# Patient Record
Sex: Female | Born: 1970 | Race: Black or African American | Hispanic: No | Marital: Single | Smoking: Current every day smoker
Health system: Southern US, Community
[De-identification: ages and names within clinical notes are randomized; demographics above are authoritative.]

## PROBLEM LIST (undated history)

## (undated) DIAGNOSIS — Q210 Ventricular septal defect: Secondary | ICD-10-CM

## (undated) DIAGNOSIS — Q2542 Hypoplasia of aorta: Secondary | ICD-10-CM

## (undated) DIAGNOSIS — F419 Anxiety disorder, unspecified: Secondary | ICD-10-CM

## (undated) DIAGNOSIS — G43909 Migraine, unspecified, not intractable, without status migrainosus: Secondary | ICD-10-CM

## (undated) DIAGNOSIS — R011 Cardiac murmur, unspecified: Secondary | ICD-10-CM

## (undated) DIAGNOSIS — F32A Depression, unspecified: Secondary | ICD-10-CM

## (undated) DIAGNOSIS — I10 Essential (primary) hypertension: Secondary | ICD-10-CM

## (undated) HISTORY — PX: HAND SURGERY: SHX662

## (undated) HISTORY — PX: TUBAL LIGATION: SHX77

---

## 2004-07-17 ENCOUNTER — Emergency Department: Payer: Self-pay | Admitting: Emergency Medicine

## 2004-10-23 ENCOUNTER — Emergency Department: Payer: Self-pay | Admitting: General Practice

## 2004-11-06 ENCOUNTER — Emergency Department: Payer: Self-pay | Admitting: Emergency Medicine

## 2004-12-30 ENCOUNTER — Emergency Department: Payer: Self-pay | Admitting: Emergency Medicine

## 2004-12-31 ENCOUNTER — Emergency Department: Payer: Self-pay | Admitting: Emergency Medicine

## 2005-05-29 ENCOUNTER — Emergency Department: Payer: Self-pay | Admitting: Emergency Medicine

## 2005-06-13 ENCOUNTER — Ambulatory Visit: Payer: Self-pay | Admitting: Family Medicine

## 2005-06-17 ENCOUNTER — Inpatient Hospital Stay: Payer: Self-pay | Admitting: Psychiatry

## 2005-07-20 ENCOUNTER — Emergency Department: Payer: Self-pay | Admitting: Emergency Medicine

## 2006-02-06 ENCOUNTER — Other Ambulatory Visit: Payer: Self-pay

## 2006-02-14 ENCOUNTER — Ambulatory Visit: Payer: Self-pay | Admitting: Orthopedic Surgery

## 2006-03-25 ENCOUNTER — Emergency Department: Payer: Self-pay | Admitting: Emergency Medicine

## 2006-04-08 HISTORY — PX: GANGLION CYST EXCISION: SHX1691

## 2006-07-14 ENCOUNTER — Encounter: Payer: Self-pay | Admitting: Rheumatology

## 2006-08-07 ENCOUNTER — Encounter: Payer: Self-pay | Admitting: Rheumatology

## 2007-07-30 ENCOUNTER — Emergency Department (HOSPITAL_COMMUNITY): Admission: EM | Admit: 2007-07-30 | Discharge: 2007-07-30 | Payer: Self-pay | Admitting: Family Medicine

## 2008-05-10 ENCOUNTER — Emergency Department: Payer: Self-pay | Admitting: Emergency Medicine

## 2008-06-10 ENCOUNTER — Emergency Department: Payer: Self-pay | Admitting: Emergency Medicine

## 2009-10-09 ENCOUNTER — Observation Stay (HOSPITAL_COMMUNITY): Admission: EM | Admit: 2009-10-09 | Discharge: 2009-10-09 | Payer: Self-pay | Admitting: Emergency Medicine

## 2010-06-24 LAB — RAPID URINE DRUG SCREEN, HOSP PERFORMED
Amphetamines: NOT DETECTED
Opiates: NOT DETECTED
Tetrahydrocannabinol: POSITIVE — AB

## 2010-06-24 LAB — CBC
HCT: 31.1 % — ABNORMAL LOW (ref 36.0–46.0)
Hemoglobin: 10 g/dL — ABNORMAL LOW (ref 12.0–15.0)
MCH: 24.9 pg — ABNORMAL LOW (ref 26.0–34.0)
MCHC: 32.2 g/dL (ref 30.0–36.0)
MCV: 77.2 fL — ABNORMAL LOW (ref 78.0–100.0)
RBC: 4.03 MIL/uL (ref 3.87–5.11)
RDW: 18 % — ABNORMAL HIGH (ref 11.5–15.5)
WBC: 11.6 10*3/uL — ABNORMAL HIGH (ref 4.0–10.5)

## 2010-06-24 LAB — URINALYSIS, ROUTINE W REFLEX MICROSCOPIC
Bilirubin Urine: NEGATIVE
Leukocytes, UA: NEGATIVE
Protein, ur: 30 mg/dL — AB
pH: 8.5 — ABNORMAL HIGH (ref 5.0–8.0)

## 2010-06-24 LAB — URINE MICROSCOPIC-ADD ON

## 2010-06-24 LAB — ETHANOL: Alcohol, Ethyl (B): <5 mg/dL (ref 0–10)

## 2010-06-24 LAB — COMPREHENSIVE METABOLIC PANEL
AST: 18 U/L (ref 0–37)
Calcium: 9.5 mg/dL (ref 8.4–10.5)
Creatinine, Ser: 0.72 mg/dL (ref 0.4–1.2)
GFR calc non Af Amer: >60 mL/min (ref >60)
Glucose, Bld: 97 mg/dL (ref 70–99)
Sodium: 140 mEq/L (ref 135–145)
Total Protein: 6.6 g/dL (ref 6.0–8.3)

## 2010-06-24 LAB — DIFFERENTIAL
Monocytes Relative: 4 % (ref 3–12)
Neutro Abs: 10.1 10*3/uL — ABNORMAL HIGH (ref 1.7–7.7)

## 2011-03-18 ENCOUNTER — Emergency Department (HOSPITAL_COMMUNITY)
Admission: EM | Admit: 2011-03-18 | Discharge: 2011-03-19 | Discharge status: Against Medical Advice | Payer: Self-pay | Attending: Emergency Medicine | Admitting: Emergency Medicine

## 2011-03-18 DIAGNOSIS — Z0389 Encounter for observation for other suspected diseases and conditions ruled out: Secondary | ICD-10-CM | POA: Insufficient documentation

## 2011-03-19 NOTE — ED Notes (Signed)
Patient left AMA.

## 2011-06-22 ENCOUNTER — Encounter (HOSPITAL_COMMUNITY): Payer: Self-pay | Admitting: Emergency Medicine

## 2011-06-22 ENCOUNTER — Emergency Department (HOSPITAL_COMMUNITY)
Admission: EM | Admit: 2011-06-22 | Discharge: 2011-06-22 | Disposition: A | Discharge status: Home / Self Care | Payer: Medicaid Other | Source: Home / Self Care

## 2011-06-22 DIAGNOSIS — J209 Acute bronchitis, unspecified: Secondary | ICD-10-CM

## 2011-06-22 HISTORY — DX: Essential (primary) hypertension: I10

## 2011-06-22 HISTORY — DX: Cardiac murmur, unspecified: R01.1

## 2011-06-22 MED ORDER — ALBUTEROL SULFATE (5 MG/ML) 0.5% IN NEBU
2.5000 mg | INHALATION_SOLUTION | Freq: Once | RESPIRATORY_TRACT | Status: AC
  Administered 2011-06-22: 2.5 mg via RESPIRATORY_TRACT

## 2011-06-22 MED ORDER — GUAIFENESIN-CODEINE 100-10 MG/5ML PO SYRP: ORAL_SOLUTION | ORAL | Status: AC

## 2011-06-22 MED ORDER — ALBUTEROL SULFATE (5 MG/ML) 0.5% IN NEBU
INHALATION_SOLUTION | RESPIRATORY_TRACT | Status: AC
  Filled 2011-06-22: qty 1

## 2011-06-22 MED ORDER — AZITHROMYCIN 250 MG PO TABS: ORAL_TABLET | ORAL | Status: AC

## 2011-06-22 MED ORDER — ALBUTEROL SULFATE HFA 108 (90 BASE) MCG/ACT IN AERS: 2.0000 | INHALATION_SPRAY | RESPIRATORY_TRACT | Status: DC | PRN

## 2011-06-22 MED ORDER — IPRATROPIUM BROMIDE 0.02 % IN SOLN
0.5000 mg | Freq: Once | RESPIRATORY_TRACT | Status: AC
  Administered 2011-06-22: 0.5 mg via RESPIRATORY_TRACT

## 2011-06-22 NOTE — ED Notes (Signed)
C/o irritated throat and chest, nagging cough.  reports onset 2 days ago.  Marland Kitchen  Unknown fever, but reports chills.  Intermittent productive cough, yellow phlegm with streaks of blood

## 2011-06-22 NOTE — ED Provider Notes (Signed)
History     CSN: [id number]  Arrival date & time 06/22/11  1442   None     Chief Complaint  Patient presents with  . URI    (Consider location/radiation/quality/duration/timing/severity/associated sxs/prior treatment) HPI Comments: Patient presents today with complaints of cough and congestion for the last 2-3 days. Her cough is productive with yellow sputum. Nasal drainage is also yellow in color. She states she is sneezing, has a sore throat and also pressure in both of her ears. She has had chills but no fever that she is aware of. She also reports intermittent wheezing but denies dyspnea. She has tried DayQuil, NyQuil and Coricidin without relief.   Past Medical History  Diagnosis Date  . Hypertension   . Heart murmur     Past Surgical History  Procedure Date  . Hand surgery     cyst romved from left thumb    History reviewed. No pertinent family history.  History  Substance Use Topics  . Smoking status: Current Everyday Smoker  . Smokeless tobacco: Not on file  . Alcohol Use: No    OB History    Grav Para Term Preterm Abortions TAB SAB Ect Mult Living                  Review of Systems  Constitutional: Positive for chills and fatigue. Negative for fever.  HENT: Positive for ear pain, congestion, sore throat, rhinorrhea and postnasal drip. Negative for sneezing and sinus pressure.   Respiratory: Positive for cough and wheezing. Negative for shortness of breath.   Cardiovascular: Negative for chest pain.    Allergies  Review of patient's allergies indicates no known allergies.  Home Medications   Current Outpatient Rx  Name Route Sig Dispense Refill  . ALBUTEROL SULFATE HFA 108 (90 BASE) MCG/ACT IN AERS Inhalation Inhale 2 puffs into the lungs every 4 (four) hours as needed for wheezing or shortness of breath. 1 Inhaler 0  . AZITHROMYCIN 250 MG PO TABS  take 2 tabs today, then 1 tab daily x 4 days 6 each 0  . GUAIFENESIN-CODEINE 100-10 MG/5ML PO SYRP   1-2 tsp every 6 hrs prn cough 120 mL 0    BP 120/64  Pulse 76  Temp(Src) 99 F (37.2 C) (Oral)  Resp 19  SpO2 98%  LMP 06/15/2011  Physical Exam  Nursing note and vitals reviewed. Constitutional: She appears well-developed and well-nourished. No distress.  HENT:  Head: Normocephalic and atraumatic.  Right Ear: Tympanic membrane, external ear and ear canal normal.  Left Ear: Tympanic membrane, external ear and ear canal normal.  Nose: Nose normal.  Mouth/Throat: Uvula is midline, oropharynx is clear and moist and mucous membranes are normal. No oropharyngeal exudate, posterior oropharyngeal edema or posterior oropharyngeal erythema.  Neck: Neck supple.  Cardiovascular: Normal rate, regular rhythm and normal heart sounds.   Pulmonary/Chest: Effort normal and breath sounds normal. No respiratory distress.       Frequent cough noted.  Lymphadenopathy:    She has no cervical adenopathy.  Neurological: She is alert.  Skin: Skin is warm and dry.  Psychiatric: She has a normal mood and affect.    ED Course  Procedures (including critical care time)  Labs Reviewed - No data to display No results found.   1. Acute bronchitis       MDM  Patient reports symptomatic improvement after albuterol treatment. Significant decrease in cough frequency noted, lungs - improved breath sounds.  Melody Comas, [place] 06/22/11 1701

## 2011-06-22 NOTE — Discharge Instructions (Signed)
Increase fluids and rest. Use albuterol inhaler every 4 hrs for the first 2-3 days until symptoms begin to improve.  Do not drive or operate machinery while taking the prescription cough medication. Return if symptoms change or worsen.   Acute Bronchitis Bronchitis is when the organs and tissues involved in breathing get puffy (swollen) and can leak fluid. This makes it harder for air to get in and out of the lungs. You may cough a lot and produce thick spit (mucus). Acute means the illness started suddenly. HOME CARE  Rest.   Drink enough fluids to keep the pee (urine) clear or pale yellow.   Medicines may be given that will open up your airways to help you breathe better. Only take medicine as told by your doctor.   Use a cool mist vaporizer. This will help to thin any thick spit.   Do not smoke. Avoid secondhand smoke.  GET HELP RIGHT AWAY IF:   You have a temperature by mouth above 102 F (38.9 C), not controlled by medicine.   You have chills.   You develop severe shortness of breath or chest pain.   You have bloody spit mixed with mucus (sputum).   You throw up (vomit) often.   You lose too much body fluid (dehydrated).   You have a severe headache.   You feel faint.   You do not improve after 1 week of treatment.  MAKE SURE YOU:   Understand these instructions.   Will watch your condition.   Will get help right away if you are not doing well or get worse.  Document Released: 09/11/2007 Document Revised: 03/14/2011 Document Reviewed: 04/12/2009 [facility] Patient Information 2012 [place], [place].

## 2011-06-22 NOTE — ED Provider Notes (Signed)
Medical screening examination/treatment/procedure(s) were performed by resident physician or non-physician practitioner and as supervising physician I was immediately available for consultation/collaboration.   Lamel Mccarley DOUGLAS MD.    Annelie Boak D Naveen Clardy, MD 06/22/11 1932 

## 2011-06-28 ENCOUNTER — Emergency Department (HOSPITAL_COMMUNITY): Payer: Medicaid Other

## 2011-06-28 ENCOUNTER — Emergency Department (HOSPITAL_COMMUNITY)
Admission: EM | Admit: 2011-06-28 | Discharge: 2011-06-28 | Disposition: A | Discharge status: Home / Self Care | Payer: Medicaid Other | Attending: Emergency Medicine | Admitting: Emergency Medicine

## 2011-06-28 ENCOUNTER — Encounter (HOSPITAL_COMMUNITY): Payer: Self-pay | Admitting: Emergency Medicine

## 2011-06-28 DIAGNOSIS — F17200 Nicotine dependence, unspecified, uncomplicated: Secondary | ICD-10-CM | POA: Insufficient documentation

## 2011-06-28 DIAGNOSIS — M25519 Pain in unspecified shoulder: Secondary | ICD-10-CM | POA: Insufficient documentation

## 2011-06-28 DIAGNOSIS — I10 Essential (primary) hypertension: Secondary | ICD-10-CM | POA: Insufficient documentation

## 2011-06-28 DIAGNOSIS — M67919 Unspecified disorder of synovium and tendon, unspecified shoulder: Secondary | ICD-10-CM | POA: Insufficient documentation

## 2011-06-28 DIAGNOSIS — M75101 Unspecified rotator cuff tear or rupture of right shoulder, not specified as traumatic: Secondary | ICD-10-CM

## 2011-06-28 DIAGNOSIS — M719 Bursopathy, unspecified: Secondary | ICD-10-CM | POA: Insufficient documentation

## 2011-06-28 HISTORY — DX: Ventricular septal defect: Q21.0

## 2011-06-28 HISTORY — DX: Hypoplasia of aorta: Q25.42

## 2011-06-28 MED ORDER — HYDROCODONE-ACETAMINOPHEN 5-325 MG PO TABS
1.0000 | ORAL_TABLET | Freq: Once | ORAL | Status: DC
  Filled 2011-06-28: qty 1

## 2011-06-28 MED ORDER — ETODOLAC 500 MG PO TABS: 500.0000 mg | ORAL_TABLET | Freq: Two times a day (BID) | ORAL | Status: AC

## 2011-06-28 MED ORDER — HYDROCODONE-ACETAMINOPHEN 5-325 MG PO TABS: 1.0000 | ORAL_TABLET | Freq: Four times a day (QID) | ORAL | Status: AC | PRN

## 2011-06-28 NOTE — ED Provider Notes (Signed)
History     CSN: [id number]  Arrival date & time 06/28/11  0114   First MD Initiated Contact with Patient 06/28/11 [phone]      Chief Complaint  Patient presents with  . Shoulder Pain    (Consider location/radiation/quality/duration/timing/severity/associated sxs/prior treatment) HPI Patient states she's been having right shoulder pain since May of last year. She has not had any recent falls or injuries. Patient states she does a lot of repetitive type motions with her arm above her head. The pain has been gradually getting worse. It increases whenever she tries to lift her arm above her head and now is barely able to lift her arm above her shoulder. She denies any numbness or weakness. The pain does radiate to the back of her shoulder blade as well. She has not seen anyone for this before.  She was unable to sleep because of the pain this evening so she came to the emergency department Past Medical History  Diagnosis Date  . Hypertension   . Heart murmur   . VSD (ventricular septal defect and aortic arch hypoplasia     Past Surgical History  Procedure Date  . Tubal ligation     No family history on file.  History  Substance Use Topics  . Smoking status: Current Everyday Smoker  . Smokeless tobacco: Not on file  . Alcohol Use: Yes    OB History    Grav Para Term Preterm Abortions TAB SAB Ect Mult Living                  Review of Systems  Respiratory: Negative for shortness of breath.   Cardiovascular: Negative for chest pain.    Allergies  Review of patient's allergies indicates no known allergies.  Home Medications   Current Outpatient Rx  Name Route Sig Dispense Refill  . ALPRAZOLAM 1 MG PO TABS Oral Take 1 mg by mouth 3 (three) times daily.    . IBUPROFEN 200 MG PO TABS Oral Take 800 mg by mouth every 6 (six) hours as needed. For pain    . MIRTAZAPINE 30 MG PO TABS Oral Take 30 mg by mouth at bedtime.    Marland Kitchen NAPROXEN SODIUM 220 MG PO TABS Oral Take 220 mg by  mouth 2 (two) times daily as needed. For pain    . OLANZAPINE 20 MG PO TABS Oral Take 20 mg by mouth at bedtime.    Marland Kitchen OVER THE COUNTER MEDICATION Topical Apply 1 application topically 4 (four) times daily as needed. Muscle Rub for pain    . ETODOLAC 500 MG PO TABS Oral Take 1 tablet (500 mg total) by mouth 2 (two) times daily. 20 tablet 0  . HYDROCODONE-ACETAMINOPHEN 5-325 MG PO TABS Oral Take 1-2 tablets by mouth every 6 (six) hours as needed for pain. 16 tablet 0    BP 114/68  Pulse 71  Temp(Src) 97.2 F (36.2 C) (Oral)  Resp 22  SpO2 99%  LMP 06/25/2011  Physical Exam  Nursing note and vitals reviewed. Constitutional: She appears well-developed and well-nourished. No distress.  HENT:  Head: Normocephalic and atraumatic.  Right Ear: External ear normal.  Left Ear: External ear normal.  Eyes: Conjunctivae are normal. Right eye exhibits no discharge. Left eye exhibits no discharge. No scleral icterus.  Neck: Neck supple. No tracheal deviation present.  Cardiovascular: Normal rate.   Pulmonary/Chest: Effort normal. No stridor. No respiratory distress.  Musculoskeletal: She exhibits tenderness. She exhibits no edema.  Right shoulder: She exhibits decreased range of motion, tenderness and pain. She exhibits no swelling, no effusion, no crepitus, no deformity, normal pulse and normal strength.  Neurological: She is alert. Cranial nerve deficit: no gross deficits.  Skin: Skin is warm and dry. No rash noted.  Psychiatric: She has a normal mood and affect.    ED Course  Procedures (including critical care time)  Labs Reviewed - No data to display Dg Shoulder Right  06/28/2011  *RADIOLOGY REPORT*  Clinical Data: Shoulder pain  RIGHT SHOULDER - 2+ VIEW  Comparison: None  Findings: There is no evidence of fracture or dislocation.  There is no evidence of arthropathy or other focal bone abnormality. Soft tissues are unremarkable.  IMPRESSION: Negative exam.  Original Report  Authenticated By: Rosealee Albee, M.D.     1. Rotator cuff syndrome of right shoulder       MDM  I suspect that the patient may have a rotator cuff syndrome. She was placed in a sling. I will prescribe medications for pain. Recommended she follow up with an orthopedic doctor.        Celene Kras, MD 06/28/11 [phone]

## 2011-06-28 NOTE — Discharge Instructions (Signed)
Impingement Syndrome, Rotator Cuff, Bursitis with Rehab Impingement syndrome is a condition that involves inflammation of the tendons of the rotator cuff and the subacromial bursa, that causes pain in the shoulder. The rotator cuff consists of four tendons and muscles that control much of the shoulder and upper arm function. The subacromial bursa is a fluid filled sac that helps reduce friction between the rotator cuff and one of the bones of the shoulder (acromion). Impingement syndrome is usually an overuse injury that causes swelling of the bursa (bursitis), swelling of the tendon (tendonitis), and/or a tear of the tendon (strain). Strains are classified into three categories. Grade 1 strains cause pain, but the tendon is not lengthened. Grade 2 strains include a lengthened ligament, due to the ligament being stretched or partially ruptured. With grade 2 strains there is still function, although the function may be decreased. Grade 3 strains include a complete tear of the tendon or muscle, and function is usually impaired. SYMPTOMS   Pain around the shoulder, often at the outer portion of the upper arm.   Pain that gets worse with shoulder function, especially when reaching overhead or lifting.   Sometimes, aching when not using the arm.   Pain that wakes you up at night.   Sometimes, tenderness, swelling, warmth, or redness over the affected area.   Loss of strength.   Limited motion of the shoulder, especially reaching behind the back (to the back pocket or to unhook bra) or across your body.   Crackling sound (crepitation) when moving the arm.   Biceps tendon pain and inflammation (in the front of the shoulder). Worse when bending the elbow or lifting.  CAUSES  Impingement syndrome is often an overuse injury, in which chronic (repetitive) motions cause the tendons or bursa to become inflamed. A strain occurs when a force is paced on the tendon or muscle that is greater than it can  withstand. Common mechanisms of injury include: Stress from sudden increase in duration, frequency, or intensity of training.  Direct hit (trauma) to the shoulder.   Aging, erosion of the tendon with normal use.   Bony bump on shoulder (acromial spur).  RISK INCREASES WITH:  Contact sports (football, wrestling, boxing).   Throwing sports (baseball, tennis, volleyball).   Weightlifting and bodybuilding.   Heavy labor.   Previous injury to the rotator cuff, including impingement.   Poor shoulder strength and flexibility.   Failure to warm up properly before activity.   Inadequate protective equipment.   Old age.   Bony bump on shoulder (acromial spur).  PREVENTION   Warm up and stretch properly before activity.   Allow for adequate recovery between workouts.   Maintain physical fitness:   Strength, flexibility, and endurance.   Cardiovascular fitness.   Learn and use proper exercise technique.  PROGNOSIS  If treated properly, impingement syndrome usually goes away within 6 weeks. Sometimes surgery is required.  RELATED COMPLICATIONS   Longer healing time if not properly treated, or if not given enough time to heal.   Recurring symptoms, that result in a chronic condition.   Shoulder stiffness, frozen shoulder, or loss of motion.   Rotator cuff tendon tear.   Recurring symptoms, especially if activity is resumed too soon, with overuse, with a direct blow, or when using poor technique.  TREATMENT  Treatment first involves the use of ice and medicine, to reduce pain and inflammation. The use of strengthening and stretching exercises may help reduce pain with activity. These exercises may   be performed at home or with a therapist. If non-surgical treatment is unsuccessful after more than 6 months, surgery may be advised. After surgery and rehabilitation, activity is usually possible in 3 months.  MEDICATION  If pain medicine is needed, nonsteroidal  anti-inflammatory medicines (aspirin and ibuprofen), or other minor pain relievers (acetaminophen), are often advised.   Do not take pain medicine for 7 days before surgery.   Prescription pain relievers may be given, if your caregiver thinks they are needed. Use only as directed and only as much as you need.   Corticosteroid injections may be given by your caregiver. These injections should be reserved for the most serious cases, because they may only be given a certain number of times.  HEAT AND COLD  Cold treatment (icing) should be applied for 10 to 15 minutes every 2 to 3 hours for inflammation and pain, and immediately after activity that aggravates your symptoms. Use ice packs or an ice massage.   Heat treatment may be used before performing stretching and strengthening activities prescribed by your caregiver, physical therapist, or athletic trainer. Use a heat pack or a warm water soak.  SEEK MEDICAL CARE IF:   Symptoms get worse or do not improve in 4 to 6 weeks, despite treatment.   New, unexplained symptoms develop. (Drugs used in treatment may produce side effects.)  EXERCISES  RANGE OF MOTION (ROM) AND STRETCHING EXERCISES - Impingement Syndrome (Rotator Cuff  Tendinitis, Bursitis) These exercises may help you when beginning to rehabilitate your injury. Your symptoms may go away with or without further involvement from your physician, physical therapist or athletic trainer. While completing these exercises, remember:   Restoring tissue flexibility helps normal motion to return to the joints. This allows healthier, less painful movement and activity.   An effective stretch should be held for at least 30 seconds.   A stretch should never be painful. You should only feel a gentle lengthening or release in the stretched tissue.  STRETCH - Flexion, Standing  Stand with good posture. With an underhand grip on your right / left hand, and an overhand grip on the opposite hand, grasp  a broomstick or cane so that your hands are a little more than shoulder width apart.   Keeping your right / left elbow straight and shoulder muscles relaxed, push the stick with your opposite hand, to raise your right / left arm in front of your body and then overhead. Raise your arm until you feel a stretch in your right / left shoulder, but before you have increased shoulder pain.   Try to avoid shrugging your right / left shoulder as your arm rises, by keeping your shoulder blade tucked down and toward your mid-back spine. Hold for __________ seconds.   Slowly return to the starting position.  Repeat __________ times. Complete this exercise __________ times per day. STRETCH - Abduction, Supine  Lie on your back. With an underhand grip on your right / left hand and an overhand grip on the opposite hand, grasp a broomstick or cane so that your hands are a little more than shoulder width apart.   Keeping your right / left elbow straight and your shoulder muscles relaxed, push the stick with your opposite hand, to raise your right / left arm out to the side of your body and then overhead. Raise your arm until you feel a stretch in your right / left shoulder, but before you have increased shoulder pain.   Try to avoid shrugging   your right / left shoulder as your arm rises, by keeping your shoulder blade tucked down and toward your mid-back spine. Hold for __________ seconds.   Slowly return to the starting position.  Repeat __________ times. Complete this exercise __________ times per day. ROM - Flexion, Active-Assisted  Lie on your back. You may bend your knees for comfort.   Grasp a broomstick or cane so your hands are about shoulder width apart. Your right / left hand should grip the end of the stick, so that your hand is positioned "thumbs-up," as if you were about to shake hands.   Using your healthy arm to lead, raise your right / left arm overhead, until you feel a gentle stretch in your  shoulder. Hold for __________ seconds.   Use the stick to assist in returning your right / left arm to its starting position.  Repeat __________ times. Complete this exercise __________ times per day.  ROM - Internal Rotation, Supine   Lie on your back on a firm surface. Place your right / left elbow about 60 degrees away from your side. Elevate your elbow with a folded towel, so that the elbow and shoulder are the same height.   Using a broomstick or cane and your strong arm, pull your right / left hand toward your body until you feel a gentle stretch, but no increase in your shoulder pain. Keep your shoulder and elbow in place throughout the exercise.   Hold for __________ seconds. Slowly return to the starting position.  Repeat __________ times. Complete this exercise __________ times per day. STRETCH - Internal Rotation  Place your right / left hand behind your back, palm up.   Throw a towel or belt over your opposite shoulder. Grasp the towel with your right / left hand.   While keeping an upright posture, gently pull up on the towel, until you feel a stretch in the front of your right / left shoulder.   Avoid shrugging your right / left shoulder as your arm rises, by keeping your shoulder blade tucked down and toward your mid-back spine.   Hold for __________ seconds. Release the stretch, by lowering your healthy hand.  Repeat __________ times. Complete this exercise __________ times per day. ROM - Internal Rotation   Using an underhand grip, grasp a stick behind your back with both hands.   While standing upright with good posture, slide the stick up your back until you feel a mild stretch in the front of your shoulder.   Hold for __________ seconds. Slowly return to your starting position.  Repeat __________ times. Complete this exercise __________ times per day.  STRETCH - Posterior Shoulder Capsule   Stand or sit with good posture. Grasp your right / left elbow and draw it  across your chest, keeping it at the same height as your shoulder.   Pull your elbow, so your upper arm comes in closer to your chest. Pull until you feel a gentle stretch in the back of your shoulder.   Hold for __________ seconds.  Repeat __________ times. Complete this exercise __________ times per day. STRENGTHENING EXERCISES - Impingement Syndrome (Rotator Cuff Tendinitis, Bursitis) These exercises may help you when beginning to rehabilitate your injury. They may resolve your symptoms with or without further involvement from your physician, physical therapist or athletic trainer. While completing these exercises, remember:  Muscles can gain both the endurance and the strength needed for everyday activities through controlled exercises.   Complete these exercises as   instructed by your physician, physical therapist or athletic trainer. Increase the resistance and repetitions only as guided.   You may experience muscle soreness or fatigue, but the pain or discomfort you are trying to eliminate should never worsen during these exercises. If this pain does get worse, stop and make sure you are following the directions exactly. If the pain is still present after adjustments, discontinue the exercise until you can discuss the trouble with your clinician.   During your recovery, avoid activity or exercises which involve actions that place your injured hand or elbow above your head or behind your back or head. These positions stress the tissues which you are trying to heal.  STRENGTH - Scapular Depression and Adduction   With good posture, sit on a firm chair. Support your arms in front of you, with pillows, arm rests, or on a table top. Have your elbows in line with the sides of your body.   Gently draw your shoulder blades down and toward your mid-back spine. Gradually increase the tension, without tensing the muscles along the top of your shoulders and the back of your neck.   Hold for  __________ seconds. Slowly release the tension and relax your muscles completely before starting the next repetition.   After you have practiced this exercise, remove the arm support and complete the exercise in standing as well as sitting position.  Repeat __________ times. Complete this exercise __________ times per day.  STRENGTH - Shoulder Abductors, Isometric  With good posture, stand or sit about 4-6 inches from a wall, with your right / left side facing the wall.   Bend your right / left elbow. Gently press your right / left elbow into the wall. Increase the pressure gradually, until you are pressing as hard as you can, without shrugging your shoulder or increasing any shoulder discomfort.   Hold for __________ seconds.   Release the tension slowly. Relax your shoulder muscles completely before you begin the next repetition.  Repeat __________ times. Complete this exercise __________ times per day.  STRENGTH - External Rotators, Isometric  Keep your right / left elbow at your side and bend it 90 degrees.   Step into a door frame so that the outside of your right / left wrist can press against the door frame without your upper arm leaving your side.   Gently press your right / left wrist into the door frame, as if you were trying to swing the back of your hand away from your stomach. Gradually increase the tension, until you are pressing as hard as you can, without shrugging your shoulder or increasing any shoulder discomfort.   Hold for __________ seconds.   Release the tension slowly. Relax your shoulder muscles completely before you begin the next repetition.  Repeat __________ times. Complete this exercise __________ times per day.  STRENGTH - Supraspinatus   Stand or sit with good posture. Grasp a __________ weight, or an exercise band or tubing, so that your hand is "thumbs-up," like you are shaking hands.   Slowly lift your right / left arm in a "V" away from your thigh,  diagonally into the space between your side and straight ahead. Lift your hand to shoulder height or as far as you can, without increasing any shoulder pain. At first, many people do not lift their hands above shoulder height.   Avoid shrugging your right / left shoulder as your arm rises, by keeping your shoulder blade tucked down and toward your mid-back   spine.   Hold for __________ seconds. Control the descent of your hand, as you slowly return to your starting position.  Repeat __________ times. Complete this exercise __________ times per day.  STRENGTH - External Rotators  Secure a rubber exercise band or tubing to a fixed object (table, pole) so that it is at the same height as your right / left elbow when you are standing or sitting on a firm surface.   Stand or sit so that the secured exercise band is at your uninjured side.   Bend your right / left elbow 90 degrees. Place a folded towel or small pillow under your right / left arm, so that your elbow is a few inches away from your side.   Keeping the tension on the exercise band, pull it away from your body, as if pivoting on your elbow. Be sure to keep your body steady, so that the movement is coming only from your rotating shoulder.   Hold for __________ seconds. Release the tension in a controlled manner, as you return to the starting position.  Repeat __________ times. Complete this exercise __________ times per day.  STRENGTH - Internal Rotators   Secure a rubber exercise band or tubing to a fixed object (table, pole) so that it is at the same height as your right / left elbow when you are standing or sitting on a firm surface.   Stand or sit so that the secured exercise band is at your right / left side.   Bend your elbow 90 degrees. Place a folded towel or small pillow under your right / left arm so that your elbow is a few inches away from your side.   Keeping the tension on the exercise band, pull it across your body,  toward your stomach. Be sure to keep your body steady, so that the movement is coming only from your rotating shoulder.   Hold for __________ seconds. Release the tension in a controlled manner, as you return to the starting position.  Repeat __________ times. Complete this exercise __________ times per day.  STRENGTH - Scapular Protractors, Standing   Stand arms length away from a wall. Place your hands on the wall, keeping your elbows straight.   Begin by dropping your shoulder blades down and toward your mid-back spine.   To strengthen your protractors, keep your shoulder blades down, but slide them forward on your rib cage. It will feel as if you are lifting the back of your rib cage away from the wall. This is a subtle motion and can be challenging to complete. Ask your caregiver for further instruction, if you are not sure you are doing the exercise correctly.   Hold for __________ seconds. Slowly return to the starting position, resting the muscles completely before starting the next repetition.  Repeat __________ times. Complete this exercise __________ times per day. STRENGTH - Scapular Protractors, Supine  Lie on your back on a firm surface. Extend your right / left arm straight into the air while holding a __________ weight in your hand.   Keeping your head and back in place, lift your shoulder off the floor.   Hold for __________ seconds. Slowly return to the starting position, and allow your muscles to relax completely before starting the next repetition.  Repeat __________ times. Complete this exercise __________ times per day. STRENGTH - Scapular Protractors, Quadruped  Get onto your hands and knees, with your shoulders directly over your hands (or as close as you can   be, comfortably).   Keeping your elbows locked, lift the back of your rib cage up into your shoulder blades, so your mid-back rounds out. Keep your neck muscles relaxed.   Hold this position for __________  seconds. Slowly return to the starting position and allow your muscles to relax completely before starting the next repetition.  Repeat __________ times. Complete this exercise __________ times per day.  STRENGTH - Scapular Retractors  Secure a rubber exercise band or tubing to a fixed object (table, pole), so that it is at the height of your shoulders when you are either standing, or sitting on a firm armless chair.   With a palm down grip, grasp an end of the band in each hand. Straighten your elbows and lift your hands straight in front of you, at shoulder height. Step back, away from the secured end of the band, until it becomes tense.   Squeezing your shoulder blades together, draw your elbows back toward your sides, as you bend them. Keep your upper arms lifted away from your body throughout the exercise.   Hold for __________ seconds. Slowly ease the tension on the band, as you reverse the directions and return to the starting position.  Repeat __________ times. Complete this exercise __________ times per day. STRENGTH - Shoulder Extensors   Secure a rubber exercise band or tubing to a fixed object (table, pole) so that it is at the height of your shoulders when you are either standing, or sitting on a firm armless chair.   With a thumbs-up grip, grasp an end of the band in each hand. Straighten your elbows and lift your hands straight in front of you, at shoulder height. Step back, away from the secured end of the band, until it becomes tense.   Squeezing your shoulder blades together, pull your hands down to the sides of your thighs. Do not allow your hands to go behind you.   Hold for __________ seconds. Slowly ease the tension on the band, as you reverse the directions and return to the starting position.  Repeat __________ times. Complete this exercise __________ times per day.  STRENGTH - Scapular Retractors and External Rotators   Secure a rubber exercise band or tubing to a  fixed object (table, pole) so that it is at the height as your shoulders, when you are either standing, or sitting on a firm armless chair.   With a palm down grip, grasp an end of the band in each hand. Bend your elbows 90 degrees and lift your elbows to shoulder height, at your sides. Step back, away from the secured end of the band, until it becomes tense.   Squeezing your shoulder blades together, rotate your shoulders so that your upper arms and elbows remain stationary, but your fists travel upward to head height.   Hold for __________ seconds. Slowly ease the tension on the band, as you reverse the directions and return to the starting position.  Repeat __________ times. Complete this exercise __________ times per day.  STRENGTH - Scapular Retractors and External Rotators, Rowing   Secure a rubber exercise band or tubing to a fixed object (table, pole) so that it is at the height of your shoulders, when you are either standing, or sitting on a firm armless chair.   With a palm down grip, grasp an end of the band in each hand. Straighten your elbows and lift your hands straight in front of you, at shoulder height. Step back, away from the   secured end of the band, until it becomes tense.   Step 1: Squeeze your shoulder blades together. Bending your elbows, draw your hands to your chest, as if you are rowing a boat. At the end of this motion, your hands and elbow should be at shoulder height and your elbows should be out to your sides.   Step 2: Rotate your shoulders, to raise your hands above your head. Your forearms should be vertical and your upper arms should be horizontal.   Hold for __________ seconds. Slowly ease the tension on the band, as you reverse the directions and return to the starting position.  Repeat __________ times. Complete this exercise __________ times per day.  STRENGTH - Scapular Depressors  Find a sturdy chair without wheels, such as a dining room chair.    Keeping your feet on the floor, and your hands on the chair arms, lift your bottom up from the seat, and lock your elbows.   Keeping your elbows straight, allow gravity to pull your body weight down. Your shoulders will rise toward your ears.   Raise your body against gravity by drawing your shoulder blades down your back, shortening the distance between your shoulders and ears. Although your feet should always maintain contact with the floor, your feet should progressively support less body weight, as you get stronger.   Hold for __________ seconds. In a controlled and slow manner, lower your body weight to begin the next repetition.  Repeat __________ times. Complete this exercise __________ times per day.  Document Released: 03/25/2005 Document Revised: 03/14/2011 Document Reviewed: 07/07/2008 [employer] Patient Information 2012 [employer] 

## 2011-06-28 NOTE — ED Notes (Signed)
PT. REPORTS RIGHT SHOULDER PAIN SINCE MAY LAST YEAR , DENIES RECENT INJURY , PAIN GOT WORSE THIS EVENING UNRELIEVED BY OTC PAIN MEDICATIONS.

## 2011-07-15 ENCOUNTER — Encounter (HOSPITAL_COMMUNITY): Payer: Self-pay | Admitting: Emergency Medicine

## 2011-07-15 ENCOUNTER — Encounter (HOSPITAL_COMMUNITY): Payer: Self-pay | Admitting: Cardiology

## 2011-07-15 ENCOUNTER — Emergency Department (INDEPENDENT_AMBULATORY_CARE_PROVIDER_SITE_OTHER)
Admission: EM | Admit: 2011-07-15 | Discharge: 2011-07-15 | Disposition: A | Discharge status: Home / Self Care | Payer: Medicaid Other | Source: Home / Self Care | Attending: Emergency Medicine | Admitting: Emergency Medicine

## 2011-07-15 DIAGNOSIS — M719 Bursopathy, unspecified: Secondary | ICD-10-CM

## 2011-07-15 DIAGNOSIS — M778 Other enthesopathies, not elsewhere classified: Secondary | ICD-10-CM

## 2011-07-15 MED ORDER — IBUPROFEN 800 MG PO TABS: 800.0000 mg | ORAL_TABLET | Freq: Three times a day (TID) | ORAL | Status: AC

## 2011-07-15 MED ORDER — HYDROCODONE-ACETAMINOPHEN 5-325 MG PO TABS: 2.0000 | ORAL_TABLET | ORAL | Status: AC | PRN

## 2011-07-15 NOTE — ED Provider Notes (Signed)
Dx: shoulder tendonitis  Medical screening examination/treatment/procedure(s) were performed by non-physician practitioner and as supervising physician I was immediately available for consultation/collaboration.  Luiz Blare MD   Luiz Blare, MD 07/15/11 [phone]

## 2011-07-15 NOTE — ED Notes (Signed)
Pt reports right shoulder pain that started one week ago. The only strenuous activity the pt can think of is that she played golf and tennis on the Wii. Denies injury. The pain is in the posterior shoulder area in the shoulder blade up into her neck. Right shoulder is tender to touch on exam.

## 2011-07-15 NOTE — Discharge Instructions (Signed)
Impingement Syndrome, Rotator Cuff, Bursitis with Rehab Impingement syndrome is a condition that involves inflammation of the tendons of the rotator cuff and the subacromial bursa, that causes pain in the shoulder. The rotator cuff consists of four tendons and muscles that control much of the shoulder and upper arm function. The subacromial bursa is a fluid filled sac that helps reduce friction between the rotator cuff and one of the bones of the shoulder (acromion). Impingement syndrome is usually an overuse injury that causes swelling of the bursa (bursitis), swelling of the tendon (tendonitis), and/or a tear of the tendon (strain). Strains are classified into three categories. Grade 1 strains cause pain, but the tendon is not lengthened. Grade 2 strains include a lengthened ligament, due to the ligament being stretched or partially ruptured. With grade 2 strains there is still function, although the function may be decreased. Grade 3 strains include a complete tear of the tendon or muscle, and function is usually impaired. SYMPTOMS   Pain around the shoulder, often at the outer portion of the upper arm.   Pain that gets worse with shoulder function, especially when reaching overhead or lifting.   Sometimes, aching when not using the arm.   Pain that wakes you up at night.   Sometimes, tenderness, swelling, warmth, or redness over the affected area.   Loss of strength.   Limited motion of the shoulder, especially reaching behind the back (to the back pocket or to unhook bra) or across your body.   Crackling sound (crepitation) when moving the arm.   Biceps tendon pain and inflammation (in the front of the shoulder). Worse when bending the elbow or lifting.  CAUSES  Impingement syndrome is often an overuse injury, in which chronic (repetitive) motions cause the tendons or bursa to become inflamed. A strain occurs when a force is paced on the tendon or muscle that is greater than it can  withstand. Common mechanisms of injury include: Stress from sudden increase in duration, frequency, or intensity of training.  Direct hit (trauma) to the shoulder.   Aging, erosion of the tendon with normal use.   Bony bump on shoulder (acromial spur).  RISK INCREASES WITH:  Contact sports (football, wrestling, boxing).   Throwing sports (baseball, tennis, volleyball).   Weightlifting and bodybuilding.   Heavy labor.   Previous injury to the rotator cuff, including impingement.   Poor shoulder strength and flexibility.   Failure to warm up properly before activity.   Inadequate protective equipment.   Old age.   Bony bump on shoulder (acromial spur).  PREVENTION   Warm up and stretch properly before activity.   Allow for adequate recovery between workouts.   Maintain physical fitness:   Strength, flexibility, and endurance.   Cardiovascular fitness.   Learn and use proper exercise technique.  PROGNOSIS  If treated properly, impingement syndrome usually goes away within 6 weeks. Sometimes surgery is required.  RELATED COMPLICATIONS   Longer healing time if not properly treated, or if not given enough time to heal.   Recurring symptoms, that result in a chronic condition.   Shoulder stiffness, frozen shoulder, or loss of motion.   Rotator cuff tendon tear.   Recurring symptoms, especially if activity is resumed too soon, with overuse, with a direct blow, or when using poor technique.  TREATMENT  Treatment first involves the use of ice and medicine, to reduce pain and inflammation. The use of strengthening and stretching exercises may help reduce pain with activity. These exercises may   be performed at home or with a therapist. If non-surgical treatment is unsuccessful after more than 6 months, surgery may be advised. After surgery and rehabilitation, activity is usually possible in 3 months.  MEDICATION  If pain medicine is needed, nonsteroidal  anti-inflammatory medicines (aspirin and ibuprofen), or other minor pain relievers (acetaminophen), are often advised.   Do not take pain medicine for 7 days before surgery.   Prescription pain relievers may be given, if your caregiver thinks they are needed. Use only as directed and only as much as you need.   Corticosteroid injections may be given by your caregiver. These injections should be reserved for the most serious cases, because they may only be given a certain number of times.  HEAT AND COLD  Cold treatment (icing) should be applied for 10 to 15 minutes every 2 to 3 hours for inflammation and pain, and immediately after activity that aggravates your symptoms. Use ice packs or an ice massage.   Heat treatment may be used before performing stretching and strengthening activities prescribed by your caregiver, physical therapist, or athletic trainer. Use a heat pack or a warm water soak.  SEEK MEDICAL CARE IF:   Symptoms get worse or do not improve in 4 to 6 weeks, despite treatment.   New, unexplained symptoms develop. (Drugs used in treatment may produce side effects.)  EXERCISES  RANGE OF MOTION (ROM) AND STRETCHING EXERCISES - Impingement Syndrome (Rotator Cuff  Tendinitis, Bursitis) These exercises may help you when beginning to rehabilitate your injury. Your symptoms may go away with or without further involvement from your physician, physical therapist or athletic trainer. While completing these exercises, remember:   Restoring tissue flexibility helps normal motion to return to the joints. This allows healthier, less painful movement and activity.   An effective stretch should be held for at least 30 seconds.   A stretch should never be painful. You should only feel a gentle lengthening or release in the stretched tissue.  STRETCH - Flexion, Standing  Stand with good posture. With an underhand grip on your right / left hand, and an overhand grip on the opposite hand, grasp  a broomstick or cane so that your hands are a little more than shoulder width apart.   Keeping your right / left elbow straight and shoulder muscles relaxed, push the stick with your opposite hand, to raise your right / left arm in front of your body and then overhead. Raise your arm until you feel a stretch in your right / left shoulder, but before you have increased shoulder pain.   Try to avoid shrugging your right / left shoulder as your arm rises, by keeping your shoulder blade tucked down and toward your mid-back spine. Hold for __________ seconds.   Slowly return to the starting position.  Repeat __________ times. Complete this exercise __________ times per day. STRETCH - Abduction, Supine  Lie on your back. With an underhand grip on your right / left hand and an overhand grip on the opposite hand, grasp a broomstick or cane so that your hands are a little more than shoulder width apart.   Keeping your right / left elbow straight and your shoulder muscles relaxed, push the stick with your opposite hand, to raise your right / left arm out to the side of your body and then overhead. Raise your arm until you feel a stretch in your right / left shoulder, but before you have increased shoulder pain.   Try to avoid shrugging   your right / left shoulder as your arm rises, by keeping your shoulder blade tucked down and toward your mid-back spine. Hold for __________ seconds.   Slowly return to the starting position.  Repeat __________ times. Complete this exercise __________ times per day. ROM - Flexion, Active-Assisted  Lie on your back. You may bend your knees for comfort.   Grasp a broomstick or cane so your hands are about shoulder width apart. Your right / left hand should grip the end of the stick, so that your hand is positioned "thumbs-up," as if you were about to shake hands.   Using your healthy arm to lead, raise your right / left arm overhead, until you feel a gentle stretch in your  shoulder. Hold for __________ seconds.   Use the stick to assist in returning your right / left arm to its starting position.  Repeat __________ times. Complete this exercise __________ times per day.  ROM - Internal Rotation, Supine   Lie on your back on a firm surface. Place your right / left elbow about 60 degrees away from your side. Elevate your elbow with a folded towel, so that the elbow and shoulder are the same height.   Using a broomstick or cane and your strong arm, pull your right / left hand toward your body until you feel a gentle stretch, but no increase in your shoulder pain. Keep your shoulder and elbow in place throughout the exercise.   Hold for __________ seconds. Slowly return to the starting position.  Repeat __________ times. Complete this exercise __________ times per day. STRETCH - Internal Rotation  Place your right / left hand behind your back, palm up.   Throw a towel or belt over your opposite shoulder. Grasp the towel with your right / left hand.   While keeping an upright posture, gently pull up on the towel, until you feel a stretch in the front of your right / left shoulder.   Avoid shrugging your right / left shoulder as your arm rises, by keeping your shoulder blade tucked down and toward your mid-back spine.   Hold for __________ seconds. Release the stretch, by lowering your healthy hand.  Repeat __________ times. Complete this exercise __________ times per day. ROM - Internal Rotation   Using an underhand grip, grasp a stick behind your back with both hands.   While standing upright with good posture, slide the stick up your back until you feel a mild stretch in the front of your shoulder.   Hold for __________ seconds. Slowly return to your starting position.  Repeat __________ times. Complete this exercise __________ times per day.  STRETCH - Posterior Shoulder Capsule   Stand or sit with good posture. Grasp your right / left elbow and draw it  across your chest, keeping it at the same height as your shoulder.   Pull your elbow, so your upper arm comes in closer to your chest. Pull until you feel a gentle stretch in the back of your shoulder.   Hold for __________ seconds.  Repeat __________ times. Complete this exercise __________ times per day. STRENGTHENING EXERCISES - Impingement Syndrome (Rotator Cuff Tendinitis, Bursitis) These exercises may help you when beginning to rehabilitate your injury. They may resolve your symptoms with or without further involvement from your physician, physical therapist or athletic trainer. While completing these exercises, remember:  Muscles can gain both the endurance and the strength needed for everyday activities through controlled exercises.   Complete these exercises as   instructed by your physician, physical therapist or athletic trainer. Increase the resistance and repetitions only as guided.   You may experience muscle soreness or fatigue, but the pain or discomfort you are trying to eliminate should never worsen during these exercises. If this pain does get worse, stop and make sure you are following the directions exactly. If the pain is still present after adjustments, discontinue the exercise until you can discuss the trouble with your clinician.   During your recovery, avoid activity or exercises which involve actions that place your injured hand or elbow above your head or behind your back or head. These positions stress the tissues which you are trying to heal.  STRENGTH - Scapular Depression and Adduction   With good posture, sit on a firm chair. Support your arms in front of you, with pillows, arm rests, or on a table top. Have your elbows in line with the sides of your body.   Gently draw your shoulder blades down and toward your mid-back spine. Gradually increase the tension, without tensing the muscles along the top of your shoulders and the back of your neck.   Hold for  __________ seconds. Slowly release the tension and relax your muscles completely before starting the next repetition.   After you have practiced this exercise, remove the arm support and complete the exercise in standing as well as sitting position.  Repeat __________ times. Complete this exercise __________ times per day.  STRENGTH - Shoulder Abductors, Isometric  With good posture, stand or sit about 4-6 inches from a wall, with your right / left side facing the wall.   Bend your right / left elbow. Gently press your right / left elbow into the wall. Increase the pressure gradually, until you are pressing as hard as you can, without shrugging your shoulder or increasing any shoulder discomfort.   Hold for __________ seconds.   Release the tension slowly. Relax your shoulder muscles completely before you begin the next repetition.  Repeat __________ times. Complete this exercise __________ times per day.  STRENGTH - External Rotators, Isometric  Keep your right / left elbow at your side and bend it 90 degrees.   Step into a door frame so that the outside of your right / left wrist can press against the door frame without your upper arm leaving your side.   Gently press your right / left wrist into the door frame, as if you were trying to swing the back of your hand away from your stomach. Gradually increase the tension, until you are pressing as hard as you can, without shrugging your shoulder or increasing any shoulder discomfort.   Hold for __________ seconds.   Release the tension slowly. Relax your shoulder muscles completely before you begin the next repetition.  Repeat __________ times. Complete this exercise __________ times per day.  STRENGTH - Supraspinatus   Stand or sit with good posture. Grasp a __________ weight, or an exercise band or tubing, so that your hand is "thumbs-up," like you are shaking hands.   Slowly lift your right / left arm in a "V" away from your thigh,  diagonally into the space between your side and straight ahead. Lift your hand to shoulder height or as far as you can, without increasing any shoulder pain. At first, many people do not lift their hands above shoulder height.   Avoid shrugging your right / left shoulder as your arm rises, by keeping your shoulder blade tucked down and toward your mid-back   spine.   Hold for __________ seconds. Control the descent of your hand, as you slowly return to your starting position.  Repeat __________ times. Complete this exercise __________ times per day.  STRENGTH - External Rotators  Secure a rubber exercise band or tubing to a fixed object (table, pole) so that it is at the same height as your right / left elbow when you are standing or sitting on a firm surface.   Stand or sit so that the secured exercise band is at your uninjured side.   Bend your right / left elbow 90 degrees. Place a folded towel or small pillow under your right / left arm, so that your elbow is a few inches away from your side.   Keeping the tension on the exercise band, pull it away from your body, as if pivoting on your elbow. Be sure to keep your body steady, so that the movement is coming only from your rotating shoulder.   Hold for __________ seconds. Release the tension in a controlled manner, as you return to the starting position.  Repeat __________ times. Complete this exercise __________ times per day.  STRENGTH - Internal Rotators   Secure a rubber exercise band or tubing to a fixed object (table, pole) so that it is at the same height as your right / left elbow when you are standing or sitting on a firm surface.   Stand or sit so that the secured exercise band is at your right / left side.   Bend your elbow 90 degrees. Place a folded towel or small pillow under your right / left arm so that your elbow is a few inches away from your side.   Keeping the tension on the exercise band, pull it across your body,  toward your stomach. Be sure to keep your body steady, so that the movement is coming only from your rotating shoulder.   Hold for __________ seconds. Release the tension in a controlled manner, as you return to the starting position.  Repeat __________ times. Complete this exercise __________ times per day.  STRENGTH - Scapular Protractors, Standing   Stand arms length away from a wall. Place your hands on the wall, keeping your elbows straight.   Begin by dropping your shoulder blades down and toward your mid-back spine.   To strengthen your protractors, keep your shoulder blades down, but slide them forward on your rib cage. It will feel as if you are lifting the back of your rib cage away from the wall. This is a subtle motion and can be challenging to complete. Ask your caregiver for further instruction, if you are not sure you are doing the exercise correctly.   Hold for __________ seconds. Slowly return to the starting position, resting the muscles completely before starting the next repetition.  Repeat __________ times. Complete this exercise __________ times per day. STRENGTH - Scapular Protractors, Supine  Lie on your back on a firm surface. Extend your right / left arm straight into the air while holding a __________ weight in your hand.   Keeping your head and back in place, lift your shoulder off the floor.   Hold for __________ seconds. Slowly return to the starting position, and allow your muscles to relax completely before starting the next repetition.  Repeat __________ times. Complete this exercise __________ times per day. STRENGTH - Scapular Protractors, Quadruped  Get onto your hands and knees, with your shoulders directly over your hands (or as close as you can   be, comfortably).   Keeping your elbows locked, lift the back of your rib cage up into your shoulder blades, so your mid-back rounds out. Keep your neck muscles relaxed.   Hold this position for __________  seconds. Slowly return to the starting position and allow your muscles to relax completely before starting the next repetition.  Repeat __________ times. Complete this exercise __________ times per day.  STRENGTH - Scapular Retractors  Secure a rubber exercise band or tubing to a fixed object (table, pole), so that it is at the height of your shoulders when you are either standing, or sitting on a firm armless chair.   With a palm down grip, grasp an end of the band in each hand. Straighten your elbows and lift your hands straight in front of you, at shoulder height. Step back, away from the secured end of the band, until it becomes tense.   Squeezing your shoulder blades together, draw your elbows back toward your sides, as you bend them. Keep your upper arms lifted away from your body throughout the exercise.   Hold for __________ seconds. Slowly ease the tension on the band, as you reverse the directions and return to the starting position.  Repeat __________ times. Complete this exercise __________ times per day. STRENGTH - Shoulder Extensors   Secure a rubber exercise band or tubing to a fixed object (table, pole) so that it is at the height of your shoulders when you are either standing, or sitting on a firm armless chair.   With a thumbs-up grip, grasp an end of the band in each hand. Straighten your elbows and lift your hands straight in front of you, at shoulder height. Step back, away from the secured end of the band, until it becomes tense.   Squeezing your shoulder blades together, pull your hands down to the sides of your thighs. Do not allow your hands to go behind you.   Hold for __________ seconds. Slowly ease the tension on the band, as you reverse the directions and return to the starting position.  Repeat __________ times. Complete this exercise __________ times per day.  STRENGTH - Scapular Retractors and External Rotators   Secure a rubber exercise band or tubing to a  fixed object (table, pole) so that it is at the height as your shoulders, when you are either standing, or sitting on a firm armless chair.   With a palm down grip, grasp an end of the band in each hand. Bend your elbows 90 degrees and lift your elbows to shoulder height, at your sides. Step back, away from the secured end of the band, until it becomes tense.   Squeezing your shoulder blades together, rotate your shoulders so that your upper arms and elbows remain stationary, but your fists travel upward to head height.   Hold for __________ seconds. Slowly ease the tension on the band, as you reverse the directions and return to the starting position.  Repeat __________ times. Complete this exercise __________ times per day.  STRENGTH - Scapular Retractors and External Rotators, Rowing   Secure a rubber exercise band or tubing to a fixed object (table, pole) so that it is at the height of your shoulders, when you are either standing, or sitting on a firm armless chair.   With a palm down grip, grasp an end of the band in each hand. Straighten your elbows and lift your hands straight in front of you, at shoulder height. Step back, away from the   secured end of the band, until it becomes tense.   Step 1: Squeeze your shoulder blades together. Bending your elbows, draw your hands to your chest, as if you are rowing a boat. At the end of this motion, your hands and elbow should be at shoulder height and your elbows should be out to your sides.   Step 2: Rotate your shoulders, to raise your hands above your head. Your forearms should be vertical and your upper arms should be horizontal.   Hold for __________ seconds. Slowly ease the tension on the band, as you reverse the directions and return to the starting position.  Repeat __________ times. Complete this exercise __________ times per day.  STRENGTH - Scapular Depressors  Find a sturdy chair without wheels, such as a dining room chair.    Keeping your feet on the floor, and your hands on the chair arms, lift your bottom up from the seat, and lock your elbows.   Keeping your elbows straight, allow gravity to pull your body weight down. Your shoulders will rise toward your ears.   Raise your body against gravity by drawing your shoulder blades down your back, shortening the distance between your shoulders and ears. Although your feet should always maintain contact with the floor, your feet should progressively support less body weight, as you get stronger.   Hold for __________ seconds. In a controlled and slow manner, lower your body weight to begin the next repetition.  Repeat __________ times. Complete this exercise __________ times per day.  Document Released: 03/25/2005 Document Revised: 03/14/2011 Document Reviewed: 07/07/2008 [employer] Patient Information 2012 [employer] 

## 2011-07-15 NOTE — ED Provider Notes (Signed)
History     CSN: [id number]  Arrival date & time 07/15/11  1910   First MD Initiated Contact with Patient 07/15/11 2030      Chief Complaint  Patient presents with  . Shoulder Pain    (Consider location/radiation/quality/duration/timing/severity/associated sxs/prior treatment) Patient is a 41 y.o. female presenting with shoulder pain. The history is provided by the patient. No language interpreter was used.  Shoulder Pain This is a recurrent problem. The current episode started more than 1 week ago. The problem occurs constantly. The problem has not changed since onset.The symptoms are aggravated by exertion. The symptoms are relieved by nothing. She has tried nothing for the symptoms.  Pt reports she has had problems with shoulder.  Pt was playing wii a week ago and has had pain since  Past Medical History  Diagnosis Date  . Hypertension   . Heart murmur   . VSD (ventricular septal defect and aortic arch hypoplasia     Past Surgical History  Procedure Date  . Hand surgery     cyst romved from left thumb  . Tubal ligation     No family history on file.  History  Substance Use Topics  . Smoking status: Current Everyday Smoker  . Smokeless tobacco: Not on file  . Alcohol Use: Yes    OB History    Grav Para Term Preterm Abortions TAB SAB Ect Mult Living                  Review of Systems  Allergies  Mushroom ext cmplx(shiitake-reishi-mait)  Home Medications   Current Outpatient Rx  Name Route Sig Dispense Refill  . ALBUTEROL SULFATE HFA 108 (90 BASE) MCG/ACT IN AERS Inhalation Inhale 2 puffs into the lungs every 4 (four) hours as needed for wheezing or shortness of breath. 1 Inhaler 0  . ALPRAZOLAM 1 MG PO TABS Oral Take 1 mg by mouth 3 (three) times daily.    . ETODOLAC 500 MG PO TABS Oral Take 1 tablet (500 mg total) by mouth 2 (two) times daily. 20 tablet 0  . IBUPROFEN 200 MG PO TABS Oral Take 800 mg by mouth every 6 (six) hours as needed. For pain    .  MIRTAZAPINE 30 MG PO TABS Oral Take 30 mg by mouth at bedtime.    Marland Kitchen NAPROXEN SODIUM 220 MG PO TABS Oral Take 220 mg by mouth 2 (two) times daily as needed. For pain    . OLANZAPINE 20 MG PO TABS Oral Take 20 mg by mouth at bedtime.    Marland Kitchen OVER THE COUNTER MEDICATION Topical Apply 1 application topically 4 (four) times daily as needed. Muscle Rub for pain      BP 151/85  Pulse 60  Temp(Src) 98.4 F (36.9 C) (Oral)  Resp 16  SpO2 100%  LMP 06/25/2011  Physical Exam  Nursing note and vitals reviewed. Constitutional: She appears well-developed and well-nourished.  HENT:  Head: Normocephalic.  Cardiovascular: Normal rate.   Pulmonary/Chest: Effort normal.  Musculoskeletal: She exhibits tenderness.       Tender right shoulder,  Decreased range of motion,  Pain to range of motion testing  Neurological: She is alert.  Skin: Skin is warm.  Psychiatric: She has a normal mood and affect.    ED Course  Procedures (including critical care time)  Labs Reviewed - No data to display No results found.   No diagnosis found.    MDM  Pt advised that she needs to see a  orthopaedsit for recheck and evaluation for possible tendonitis.        Lonia Skinner [place], [place] 07/15/11 2038

## 2012-05-11 ENCOUNTER — Emergency Department: Payer: Self-pay | Admitting: Emergency Medicine

## 2012-08-03 ENCOUNTER — Emergency Department: Payer: Self-pay | Admitting: Emergency Medicine

## 2012-09-26 ENCOUNTER — Emergency Department: Payer: Self-pay | Admitting: Emergency Medicine

## 2013-04-27 ENCOUNTER — Emergency Department: Payer: Self-pay | Admitting: Emergency Medicine

## 2013-10-20 ENCOUNTER — Encounter (HOSPITAL_COMMUNITY): Payer: Self-pay | Admitting: Emergency Medicine

## 2013-10-20 ENCOUNTER — Emergency Department (HOSPITAL_COMMUNITY)
Admission: EM | Admit: 2013-10-20 | Discharge: 2013-10-20 | Disposition: A | Discharge status: Home / Self Care | Payer: Self-pay | Attending: Emergency Medicine | Admitting: Emergency Medicine

## 2013-10-20 DIAGNOSIS — Z79899 Other long term (current) drug therapy: Secondary | ICD-10-CM | POA: Insufficient documentation

## 2013-10-20 DIAGNOSIS — I10 Essential (primary) hypertension: Secondary | ICD-10-CM | POA: Insufficient documentation

## 2013-10-20 DIAGNOSIS — Q254 Congenital malformation of aorta unspecified: Secondary | ICD-10-CM | POA: Insufficient documentation

## 2013-10-20 DIAGNOSIS — R011 Cardiac murmur, unspecified: Secondary | ICD-10-CM | POA: Insufficient documentation

## 2013-10-20 DIAGNOSIS — Q210 Ventricular septal defect: Secondary | ICD-10-CM | POA: Insufficient documentation

## 2013-10-20 DIAGNOSIS — Y929 Unspecified place or not applicable: Secondary | ICD-10-CM | POA: Insufficient documentation

## 2013-10-20 DIAGNOSIS — Z791 Long term (current) use of non-steroidal anti-inflammatories (NSAID): Secondary | ICD-10-CM | POA: Insufficient documentation

## 2013-10-20 DIAGNOSIS — L299 Pruritus, unspecified: Secondary | ICD-10-CM | POA: Insufficient documentation

## 2013-10-20 DIAGNOSIS — F17200 Nicotine dependence, unspecified, uncomplicated: Secondary | ICD-10-CM | POA: Insufficient documentation

## 2013-10-20 DIAGNOSIS — Y9389 Activity, other specified: Secondary | ICD-10-CM | POA: Insufficient documentation

## 2013-10-20 MED ORDER — FAMOTIDINE 20 MG PO TABS
20.0000 mg | ORAL_TABLET | Freq: Once | ORAL | Status: AC
  Administered 2013-10-20: 20 mg via ORAL
  Filled 2013-10-20: qty 1

## 2013-10-20 MED ORDER — ALPRAZOLAM 0.5 MG PO TABS: 0.5000 mg | ORAL_TABLET | Freq: Every evening | ORAL | Status: DC | PRN

## 2013-10-20 MED ORDER — ALPRAZOLAM 0.25 MG PO TABS
0.2500 mg | ORAL_TABLET | Freq: Once | ORAL | Status: AC
  Administered 2013-10-20: 0.25 mg via ORAL
  Filled 2013-10-20: qty 1

## 2013-10-20 MED ORDER — DIPHENHYDRAMINE HCL 25 MG PO CAPS
25.0000 mg | ORAL_CAPSULE | Freq: Once | ORAL | Status: AC
  Administered 2013-10-20: 25 mg via ORAL
  Filled 2013-10-20: qty 1

## 2013-10-20 MED ORDER — DIPHENHYDRAMINE HCL 25 MG PO TABS: 25.0000 mg | ORAL_TABLET | Freq: Four times a day (QID) | ORAL | Status: DC | PRN

## 2013-10-20 MED ORDER — FAMOTIDINE 20 MG PO TABS: 20.0000 mg | ORAL_TABLET | Freq: Two times a day (BID) | ORAL | Status: DC

## 2013-10-20 NOTE — Discharge Instructions (Signed)
Pruritus  °Pruritis is an itch. There are many different problems that can cause an itch. Dry skin is one of the most common causes of itching. Most cases of itching do not require medical attention.  °HOME CARE INSTRUCTIONS  °Make sure your skin is moistened on a regular basis. A moisturizer that contains petroleum jelly is best for keeping moisture in your skin. If you develop a rash, you may try the following for relief:  °· Use corticosteroid cream. °· Apply cool compresses to the affected areas. °· Bathe with Epsom salts or baking soda in the bathwater. °· Soak in colloidal oatmeal baths. These are available at your pharmacy. °· Apply baking soda paste to the rash. Stir water into baking soda until it reaches a paste-like consistency. °· Use an anti-itch lotion. °· Take over-the-counter diphenhydramine medicine by mouth as the instructions direct. °· Avoid scratching. Scratching may cause the rash to become infected. If itching is very bad, your caregiver may suggest prescription lotions or creams to lessen your symptoms. °· Avoid hot showers, which can make itching worse. A cold shower may help with itching as long as you use a moisturizer after the shower. °SEEK MEDICAL CARE IF: °The itching does not go away after several days. °Document Released: 12/05/2010 Document Revised: 06/17/2011 Document Reviewed: 12/05/2010 °Exit[employer]® Patient Information ©2015 [employer]. This information is not intended to replace advice given to you by your health care provider. Make sure you discuss any questions you have with your health care provider. ° °

## 2013-10-20 NOTE — ED Provider Notes (Signed)
CSN: [id number]     Arrival date & time 10/20/13  1414 History  This chart was scribed for non-physician practitioner Cleatrice Burke, PA-C, working with Mirna Mires, MD by Ludger Nutting, ED Scribe. This patient was seen in room TR10C/TR10C and the patient's care was started at 3:48 PM.    Chief Complaint  Patient presents with  . Insect Bite    The history is provided by the patient. No language interpreter was used.    HPI Comments: Sherry Little is a 43 y.o. female who presents to the Emergency Department complaining of multiple insect bites to the BLE that occurred over the last few days. Patient states she also awoke with a roach on her body last night. Patient states she has been uncontrollably itching to the BUE, abdomen, and back since then. She denies change in body care products or detergents. She has taken benadryl with minimal relief. She denies abdominal pain, nausea, vomiting, fever, chills. Patient states she normally takes Xanax for anxiety but states she has been unable to today.   Past Medical History  Diagnosis Date  . Hypertension   . Heart murmur   . VSD (ventricular septal defect and aortic arch hypoplasia    Past Surgical History  Procedure Laterality Date  . Hand surgery      cyst romved from left thumb  . Tubal ligation    . Ganglion cyst excision  2008    left wrist   No family history on file. History  Substance Use Topics  . Smoking status: Current Every Day Smoker -- 0.50 packs/day    Types: Cigarettes  . Smokeless tobacco: Not on file  . Alcohol Use: Yes     Comment: occas   OB History   Grav Para Term Preterm Abortions TAB SAB Ect Mult Living                 Review of Systems  Constitutional: Negative for fever and chills.  HENT: Negative for facial swelling, sore throat and trouble swallowing.   Respiratory: Negative for shortness of breath.   Gastrointestinal: Negative for nausea, vomiting, abdominal pain and diarrhea.  Skin:        +insect bites  All other systems reviewed and are negative.     Allergies  Mushroom ext cmplx(shiitake-reishi-mait)  Home Medications   Prior to Admission medications   Medication Sig Start Date End Date Taking? Authorizing Provider  ALPRAZolam Duanne Moron) 1 MG tablet Take 1 mg by mouth 3 (three) times daily.   Yes Historical Provider, MD  mirtazapine (REMERON) 30 MG tablet Take 30 mg by mouth at bedtime.   Yes Historical Provider, MD  naproxen sodium (ANAPROX) 220 MG tablet Take 220 mg by mouth 2 (two) times daily as needed. For pain   Yes Historical Provider, MD  OLANZapine (ZYPREXA) 20 MG tablet Take 20 mg by mouth at bedtime.   Yes Historical Provider, MD   BP 157/81  Pulse 64  Temp(Src) 98.7 F (37.1 C) (Oral)  Resp 16  Ht 5\' 2"  (1.575 m)  Wt 120 lb (54.432 kg)  BMI 21.94 kg/m2  SpO2 100%  LMP 10/13/2013 Physical Exam  Nursing note and vitals reviewed. Constitutional: She is oriented to person, place, and time. She appears well-developed and well-nourished. No distress.  Patient actively itching.   HENT:  Head: Normocephalic and atraumatic.  Right Ear: External ear normal.  Left Ear: External ear normal.  Nose: Nose normal.  Mouth/Throat: Oropharynx is clear and moist.  Eyes: Conjunctivae are normal.  Neck: Normal range of motion.  Cardiovascular: Normal rate, regular rhythm and normal heart sounds.   Pulmonary/Chest: Effort normal and breath sounds normal. No stridor. No respiratory distress. She has no wheezes. She has no rales.  Abdominal: Soft. She exhibits no distension.  Musculoskeletal: Normal range of motion.  Neurological: She is alert and oriented to person, place, and time. She has normal strength.  Skin: Skin is warm and dry. No rash noted. She is not diaphoretic. No erythema.  No excoriations or rashes.   Psychiatric: She has a normal mood and affect. Her behavior is normal.    ED Course  Procedures (including critical care time)  DIAGNOSTIC  STUDIES: Oxygen Saturation is 100% on RA, normal by my interpretation.    COORDINATION OF CARE: 3:52 PM Discussed treatment plan with pt at bedside and pt agreed to plan.   Labs Review Labs Reviewed - No data to display  Imaging Review No results found.   EKG Interpretation None      MDM   Final diagnoses:  Pruritic condition    Patient presents to ED with generalized itching. She has known history of mosquito bites and has been feeling increasingly itchy since finding a roach on her. She was given pepcid and benadryl and a small amount of home Xanax. Skin is normal appearing without excoriations or rash. No other signs of systemic illness. F/u with PCP. Return instructions given. Vital signs stable for discharge. Patient / Family / Caregiver informed of clinical course, understand medical decision-making process, and agree with plan.   I personally performed the services described in this documentation, which was scribed in my presence. The recorded information has been reviewed and is accurate.    Elwyn Lade, PA-C 10/21/13 [phone]

## 2013-10-20 NOTE — ED Notes (Signed)
Patient states she had been bitten by "a bunch of mosquitoes on my legs".   Patient states she has been having a lot of itching.   Patient also states found a "roach crawling into my shirt last night and I have itched ever since".

## 2013-10-27 NOTE — ED Provider Notes (Signed)
Medical screening examination/treatment/procedure(s) were performed by non-physician practitioner and as supervising physician I was immediately available for consultation/collaboration.     Mirna Mires, MD 10/27/13 [phone]

## 2013-11-16 ENCOUNTER — Ambulatory Visit: Payer: Self-pay

## 2014-01-02 ENCOUNTER — Emergency Department: Payer: Self-pay | Admitting: Emergency Medicine

## 2014-01-02 LAB — CBC WITH DIFFERENTIAL/PLATELET
BASOS ABS: 0 10*3/uL (ref 0.0–0.1)
BASOS PCT: 0.3 %
EOS ABS: 0 10*3/uL (ref 0.0–0.7)
EOS PCT: 0.2 %
HCT: 29 % — AB (ref 35.0–47.0)
HGB: 8.6 g/dL — ABNORMAL LOW (ref 12.0–16.0)
Lymphocyte #: 0.9 10*3/uL — ABNORMAL LOW (ref 1.0–3.6)
Lymphocyte %: 9.2 %
MCH: 20.7 pg — ABNORMAL LOW (ref 26.0–34.0)
MCHC: 29.6 g/dL — ABNORMAL LOW (ref 32.0–36.0)
MCV: 70 fL — AB (ref 80–100)
MONO ABS: 0.4 x10 3/mm (ref 0.2–0.9)
Monocyte %: 4.1 %
Neutrophil #: 8.3 10*3/uL — ABNORMAL HIGH (ref 1.4–6.5)
Neutrophil %: 86.2 %
Platelet: 499 10*3/uL — ABNORMAL HIGH (ref 150–440)
RBC: 4.15 10*6/uL (ref 3.80–5.20)
RDW: 18.5 % — ABNORMAL HIGH (ref 11.5–14.5)
WBC: 9.6 10*3/uL (ref 3.6–11.0)

## 2014-01-02 LAB — URINALYSIS, COMPLETE
BACTERIA: NONE SEEN
BILIRUBIN, UR: NEGATIVE
Blood: NEGATIVE
GLUCOSE, UR: NEGATIVE mg/dL (ref 0–75)
KETONE: NEGATIVE
Leukocyte Esterase: NEGATIVE
NITRITE: NEGATIVE
Ph: 7 (ref 4.5–8.0)
Protein: NEGATIVE
RBC, UR: NONE SEEN /HPF (ref 0–5)
Specific Gravity: 1.003 (ref 1.003–1.030)
WBC UR: NONE SEEN /HPF (ref 0–5)

## 2014-01-02 LAB — COMPREHENSIVE METABOLIC PANEL
ANION GAP: 7 (ref 7–16)
AST: 20 U/L (ref 15–37)
Albumin: 3.6 g/dL (ref 3.4–5.0)
Alkaline Phosphatase: 105 U/L
BUN: 8 mg/dL (ref 7–18)
Bilirubin,Total: 0.3 mg/dL (ref 0.2–1.0)
Calcium, Total: 9.5 mg/dL (ref 8.5–10.1)
Chloride: 110 mmol/L — ABNORMAL HIGH (ref 98–107)
Co2: 23 mmol/L (ref 21–32)
Creatinine: 0.74 mg/dL (ref 0.60–1.30)
EGFR (African American): >60
EGFR (Non-African Amer.): >60
GLUCOSE: 88 mg/dL (ref 65–99)
OSMOLALITY: 277 (ref 275–301)
Potassium: 3.7 mmol/L (ref 3.5–5.1)
SGPT (ALT): 15 U/L
SODIUM: 140 mmol/L (ref 136–145)
Total Protein: 7.3 g/dL (ref 6.4–8.2)

## 2014-01-02 LAB — LIPASE, BLOOD: Lipase: 85 U/L (ref 73–393)

## 2014-02-16 ENCOUNTER — Emergency Department: Payer: Self-pay | Admitting: Emergency Medicine

## 2014-03-29 ENCOUNTER — Emergency Department: Payer: Self-pay | Admitting: Emergency Medicine

## 2014-05-05 ENCOUNTER — Ambulatory Visit: Payer: Self-pay | Admitting: Internal Medicine

## 2014-08-15 ENCOUNTER — Encounter: Payer: Self-pay | Admitting: Emergency Medicine

## 2014-08-15 ENCOUNTER — Emergency Department: Payer: No Typology Code available for payment source

## 2014-08-15 ENCOUNTER — Emergency Department
Admission: EM | Admit: 2014-08-15 | Discharge: 2014-08-15 | Disposition: A | Discharge status: Home / Self Care | Payer: No Typology Code available for payment source | Attending: Emergency Medicine | Admitting: Emergency Medicine

## 2014-08-15 DIAGNOSIS — Y92410 Unspecified street and highway as the place of occurrence of the external cause: Secondary | ICD-10-CM | POA: Insufficient documentation

## 2014-08-15 DIAGNOSIS — S39012A Strain of muscle, fascia and tendon of lower back, initial encounter: Secondary | ICD-10-CM | POA: Diagnosis not present

## 2014-08-15 DIAGNOSIS — Y9389 Activity, other specified: Secondary | ICD-10-CM | POA: Insufficient documentation

## 2014-08-15 DIAGNOSIS — S161XXA Strain of muscle, fascia and tendon at neck level, initial encounter: Secondary | ICD-10-CM | POA: Insufficient documentation

## 2014-08-15 DIAGNOSIS — Y998 Other external cause status: Secondary | ICD-10-CM | POA: Diagnosis not present

## 2014-08-15 DIAGNOSIS — Z79899 Other long term (current) drug therapy: Secondary | ICD-10-CM | POA: Diagnosis not present

## 2014-08-15 DIAGNOSIS — S199XXA Unspecified injury of neck, initial encounter: Secondary | ICD-10-CM | POA: Diagnosis present

## 2014-08-15 DIAGNOSIS — Z720 Tobacco use: Secondary | ICD-10-CM | POA: Diagnosis not present

## 2014-08-15 DIAGNOSIS — I10 Essential (primary) hypertension: Secondary | ICD-10-CM | POA: Insufficient documentation

## 2014-08-15 MED ORDER — ETODOLAC 400 MG PO TABS: 400.0000 mg | ORAL_TABLET | Freq: Two times a day (BID) | ORAL | Status: DC

## 2014-08-15 MED ORDER — DIAZEPAM 2 MG PO TABS: 2.0000 mg | ORAL_TABLET | Freq: Three times a day (TID) | ORAL | Status: AC | PRN

## 2014-08-15 MED ORDER — HYDROCODONE-ACETAMINOPHEN 5-325 MG PO TABS
1.0000 | ORAL_TABLET | Freq: Once | ORAL | Status: AC
  Administered 2014-08-15: 1 via ORAL

## 2014-08-15 MED ORDER — HYDROCODONE-ACETAMINOPHEN 5-325 MG PO TABS
ORAL_TABLET | ORAL | Status: AC
  Filled 2014-08-15: qty 1
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
## 2014-10-11 ENCOUNTER — Encounter: Payer: Self-pay | Admitting: Emergency Medicine

## 2014-10-11 ENCOUNTER — Emergency Department
Admission: EM | Admit: 2014-10-11 | Discharge: 2014-10-11 | Disposition: A | Discharge status: Home / Self Care | Payer: Self-pay | Attending: Emergency Medicine | Admitting: Emergency Medicine

## 2014-10-11 DIAGNOSIS — M7582 Other shoulder lesions, left shoulder: Secondary | ICD-10-CM

## 2014-10-11 DIAGNOSIS — I10 Essential (primary) hypertension: Secondary | ICD-10-CM | POA: Insufficient documentation

## 2014-10-11 DIAGNOSIS — Z79899 Other long term (current) drug therapy: Secondary | ICD-10-CM | POA: Insufficient documentation

## 2014-10-11 DIAGNOSIS — Z720 Tobacco use: Secondary | ICD-10-CM | POA: Insufficient documentation

## 2014-10-11 DIAGNOSIS — Z791 Long term (current) use of non-steroidal anti-inflammatories (NSAID): Secondary | ICD-10-CM | POA: Insufficient documentation

## 2014-10-11 DIAGNOSIS — M7592 Shoulder lesion, unspecified, left shoulder: Secondary | ICD-10-CM | POA: Insufficient documentation

## 2014-10-11 MED ORDER — TIZANIDINE HCL 4 MG PO TABS: 4.0000 mg | ORAL_TABLET | Freq: Three times a day (TID) | ORAL | Status: AC
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
## 2015-03-21 ENCOUNTER — Emergency Department
Admission: EM | Admit: 2015-03-21 | Discharge: 2015-03-21 | Disposition: A | Discharge status: Home / Self Care | Payer: Self-pay | Attending: Emergency Medicine | Admitting: Emergency Medicine

## 2015-03-21 ENCOUNTER — Encounter: Payer: Self-pay | Admitting: Medical Oncology

## 2015-03-21 DIAGNOSIS — Z79899 Other long term (current) drug therapy: Secondary | ICD-10-CM | POA: Insufficient documentation

## 2015-03-21 DIAGNOSIS — A084 Viral intestinal infection, unspecified: Secondary | ICD-10-CM | POA: Insufficient documentation

## 2015-03-21 DIAGNOSIS — I10 Essential (primary) hypertension: Secondary | ICD-10-CM | POA: Insufficient documentation

## 2015-03-21 DIAGNOSIS — F17210 Nicotine dependence, cigarettes, uncomplicated: Secondary | ICD-10-CM | POA: Insufficient documentation

## 2015-03-21 DIAGNOSIS — Z791 Long term (current) use of non-steroidal anti-inflammatories (NSAID): Secondary | ICD-10-CM | POA: Insufficient documentation

## 2015-03-21 DIAGNOSIS — B349 Viral infection, unspecified: Secondary | ICD-10-CM | POA: Insufficient documentation

## 2015-03-21 MED ORDER — LOPERAMIDE HCL 2 MG PO TABS: 4.0000 mg | ORAL_TABLET | Freq: Four times a day (QID) | ORAL | Status: DC | PRN

## 2015-03-21 MED ORDER — NAPROXEN 500 MG PO TABS: 500.0000 mg | ORAL_TABLET | Freq: Two times a day (BID) | ORAL | Status: DC

## 2015-03-21 MED ORDER — RANITIDINE HCL 150 MG PO CAPS: 150.0000 mg | ORAL_CAPSULE | Freq: Two times a day (BID) | ORAL | Status: DC

## 2015-03-21 MED ORDER — PROMETHAZINE HCL 25 MG PO TABS: 25.0000 mg | ORAL_TABLET | Freq: Four times a day (QID) | ORAL | Status: DC | PRN

## 2015-03-21 MED ORDER — PREDNISONE 20 MG PO TABS: ORAL_TABLET | ORAL | Status: DC

## 2015-03-21 MED ORDER — GUAIFENESIN 100 MG/5ML PO SOLN
5.0000 mL | ORAL | Status: DC | PRN
Start: 2015-03-21 — End: 2016-02-11

## 2015-03-21 NOTE — ED Provider Notes (Signed)
[name]; Nasal Congestion; Diarrhea; [name] who complains of nonproductive cough and congestion for the past 2 weeks. She is also recently within the last 24 hours started developing diffuse myalgias, a few episodes of vomiting, and multiple episodes of watery diarrhea starting this morning. Denies any significant pain, no fever. The patient is tolerating oral intake.  Denies taking any medications currently.   Past Medical History  Diagnosis Date  . Hypertension   . Heart murmur   . VSD (ventricular septal defect and aortic arch hypoplasia      There are no active problems to display for this patient.    Past Surgical History  Procedure Laterality Date  . Hand surgery      cyst romved from left thumb  . Tubal ligation    . Ganglion cyst excision  2008    left wrist     Current Outpatient Rx  Name  Route  Sig  Dispense  Refill  . ALPRAZolam (XANAX) 0.5 MG tablet   Oral   Take 1 tablet (0.5 mg total) by mouth at bedtime as needed for anxiety.   10 tablet   0   . ALPRAZolam (XANAX) 1 MG tablet   Oral   Take 1 mg by mouth 3 (three) times daily.         . diazepam (VALIUM) 2 MG tablet   Oral   Take 1 tablet (2 mg total) by mouth every 8 (eight) hours as needed for muscle spasms.   10 tablet   0   . diphenhydrAMINE (BENADRYL) 25 MG tablet   Oral   Take 1 tablet (25 mg total) by mouth every 6 (six) hours as needed for itching (Rash).   30 tablet   0   . etodolac (LODINE) 400 MG tablet   Oral   Take 1 tablet (400 mg total) by mouth 2 (two) times daily.   20 tablet   0   . famotidine (PEPCID) 20 MG tablet   Oral   Take 1 tablet (20 mg total) by mouth 2 (two) times daily.   10  tablet   0   . guaiFENesin (ROBITUSSIN) 100 MG/5ML SOLN   Oral   Take 5 mLs (100 mg total) by mouth every 4 (four) hours as needed for cough or to loosen phlegm.   120 mL   0   . loperamide (IMODIUM A-D) 2 MG tablet   Oral   Take 2 tablets (4 mg total) by mouth 4 (four) times daily as needed for diarrhea or loose stools.   30 tablet   0   . mirtazapine (REMERON) 30 MG tablet   Oral   Take 30 mg by mouth at bedtime.         . naproxen (NAPROSYN) 500 MG tablet   Oral   Take 1 tablet (500 mg total) by mouth 2 (two) times daily with a meal.   20 tablet   0   . naproxen sodium (ANAPROX) 220 MG tablet   Oral   Take 220 mg by mouth 2 (two) times daily as needed. For pain         . OLANZapine (ZYPREXA) 20 MG tablet   Oral   Take 20 mg by  mouth at bedtime.         . predniSONE (DELTASONE) 20 MG tablet      40mg  (2 tablets) once by mouth today, then 20mg  (1 tablet) by mouth once daily starting tomorrow.   5 tablet   0   . promethazine (PHENERGAN) 25 MG tablet   Oral   Take 1 tablet (25 mg total) by mouth every 6 (six) hours as needed for nausea or vomiting.   15 tablet   0   . ranitidine (ZANTAC) 150 MG capsule   Oral   Take 1 capsule (150 mg total) by mouth 2 (two) times daily.   28 capsule   0   . tiZANidine (ZANAFLEX) 4 MG tablet   Oral   Take 1 tablet (4 mg total) by mouth 3 (three) times daily.   90 tablet   1      Allergies Mushroom ext cmplx(shiitake-reishi-mait) and Tramadol   No family history on file.  Social History Social History  Substance Use Topics  . Smoking status: Current Every Day Smoker -- 0.50 packs/day    Types: Cigarettes  . Smokeless tobacco: None  . Alcohol Use: Yes     Comment: occas    Review of Systems  Constitutional:   No fever or chills. No weight changes Eyes:   No blurry vision or double vision.  ENT:   No sore throat. Cardiovascular:   No chest pain. Respiratory:   No dyspnea positive  cough. Gastrointestinal:   Mild generalized abdominal pain, vomiting and diarrhea..  No BRBPR or melena. Genitourinary:   Negative for dysuria, urinary retention, bloody urine, or difficulty urinating. Musculoskeletal:   Negative for back pain. No joint swelling or pain. Diffuse myalgias. Skin:   Negative for rash. Neurological:   Negative for headaches, focal weakness or numbness. Psychiatric:  No anxiety or depression.   Endocrine:  No hot/cold intolerance, changes in energy, or sleep difficulty.  10-point ROS otherwise negative.  ____________________________________________   PHYSICAL EXAM:  VITAL SIGNS: ED Triage Vitals  Enc Vitals Group     BP 03/21/15 0821 130/83 mmHg     Pulse Rate 03/21/15 0821 61     Resp 03/21/15 0821 18     Temp 03/21/15 0821 98.3 F (36.8 C)     Temp Source 03/21/15 0821 Oral     SpO2 03/21/15 0821 98 %     Weight 03/21/15 0821 130 lb (58.968 kg)     Height 03/21/15 0821 5\' 2"  (1.575 m)     Head Cir --      Peak Flow --      Pain Score 03/21/15 0822 6     Pain Loc --      Pain Edu? --      Excl. in [place]? --      Constitutional:   Alert and oriented. Well appearing and in no distress. Eyes:   No scleral icterus. No conjunctival pallor. PERRL. EOMI ENT   Head:   Normocephalic and atraumatic.   Nose:   No congestion/rhinnorhea. No septal hematoma   Mouth/Throat:   MMM, no pharyngeal erythema. No peritonsillar mass. No uvula shift.   Neck:   No stridor. No SubQ emphysema. No meningismus. Hematological/Lymphatic/Immunilogical:   No cervical lymphadenopathy. Cardiovascular:   RRR. Normal and symmetric distal pulses are present in all extremities. No murmurs, rubs, or gallops. Respiratory:   Normal respiratory effort without tachypnea nor retractions. Breath sounds are clear and equal bilaterally. No wheezes/rales/rhonchi. Gastrointestinal:   Soft  with mild tenderness in the left upper quadrant.. No distention. There is no CVA  tenderness.  No rebound, rigidity, or guarding. Genitourinary:   deferred Musculoskeletal:   Nontender with normal range of motion in all extremities. No joint effusions.  No lower extremity tenderness.  No edema. Neurologic:   Normal speech and language.  CN 2-10 normal. Motor grossly intact. No pronator drift.  Normal gait. No gross focal neurologic deficits are appreciated.  Skin:    Skin is warm, dry and intact. No rash noted.  No petechiae, purpura, or bullae. Psychiatric:   Mood and affect are normal. Speech and behavior are normal. Patient exhibits appropriate insight and judgment.  ____________________________________________    LABS (pertinent positives/negatives) (all labs ordered are listed, but only abnormal results are displayed) Labs Reviewed - No data to display ____________________________________________   EKG    ____________________________________________    RADIOLOGY    ____________________________________________   PROCEDURES   ____________________________________________   INITIAL IMPRESSION / ASSESSMENT AND PLAN / ED COURSE  Pertinent labs & imaging results that were available during my care of the patient were reviewed by me and considered in my medical decision making (see chart for details).  Patient well appearing no acute distress. Exam is overall unremarkable and reassuring. Vital signs are normal. By report, her symptoms are consistent with viral URI and gastroenteritis. On exam the symptoms appear to be minimal. Given the duration, I'll prescribe there is supportive measures including antiemetics, antacids, NSAIDs and other helpful agents. She'll follow up with primary care in a week. Encourage her to follow up with her doctor regarding her antihypertensives and other medications that she has self discontinued for continued management of her chronic medical conditions     ____________________________________________   FINAL CLINICAL  IMPRESSION(S) / ED DIAGNOSES  Final diagnoses:  Viral gastroenteritis  Viral syndrome      Carrie Mew, MD 03/21/15 [phone]

## 2015-03-21 NOTE — ED Notes (Signed)
Pt reports she began 2 weeks ago with nasal congestion, cough, chills,  last night pt began having diarrhea and vomiting.

## 2015-03-21 NOTE — ED Notes (Signed)
Pt discharged home after verbalizing understanding of discharge instructions; nad noted. 

## 2015-03-21 NOTE — Discharge Instructions (Signed)
Viral Gastroenteritis Viral gastroenteritis is also known as stomach flu. This condition affects the stomach and intestinal tract. It can cause sudden diarrhea and vomiting. The illness typically lasts 3 to 8 days. Most people develop an immune response that eventually gets rid of the virus. While this natural response develops, the virus can make you quite ill. CAUSES  Many different viruses can cause gastroenteritis, such as rotavirus or noroviruses. You can catch one of these viruses by consuming contaminated food or water. You may also catch a virus by sharing utensils or other personal items with an infected person or by touching a contaminated surface. SYMPTOMS  The most common symptoms are diarrhea and vomiting. These problems can cause a severe loss of body fluids (dehydration) and a body salt (electrolyte) imbalance. Other symptoms may include:  Fever.  Headache.  Fatigue.  Abdominal pain. DIAGNOSIS  Your caregiver can usually diagnose viral gastroenteritis based on your symptoms and a physical exam. A stool sample may also be taken to test for the presence of viruses or other infections. TREATMENT  This illness typically goes away on its own. Treatments are aimed at rehydration. The most serious cases of viral gastroenteritis involve vomiting so severely that you are not able to keep fluids down. In these cases, fluids must be given through an intravenous line (IV). HOME CARE INSTRUCTIONS   Drink enough fluids to keep your urine clear or pale yellow. Drink small amounts of fluids frequently and increase the amounts as tolerated.  Ask your caregiver for specific rehydration instructions.  Avoid:  Foods high in sugar.  Alcohol.  Carbonated drinks.  Tobacco.  Juice.  Caffeine drinks.  Extremely hot or cold fluids.  Fatty, greasy foods.  Too much intake of anything at one time.  Dairy products until 24 to 48 hours after diarrhea stops.  You may consume probiotics.  Probiotics are active cultures of beneficial bacteria. They may lessen the amount and number of diarrheal stools in adults. Probiotics can be found in yogurt with active cultures and in supplements.  Wash your hands well to avoid spreading the virus.  Only take over-the-counter or prescription medicines for pain, discomfort, or fever as directed by your caregiver. Do not give aspirin to children. Antidiarrheal medicines are not recommended.  Ask your caregiver if you should continue to take your regular prescribed and over-the-counter medicines.  Keep all follow-up appointments as directed by your caregiver. SEEK IMMEDIATE MEDICAL CARE IF:   You are unable to keep fluids down.  You do not urinate at least once every 6 to 8 hours.  You develop shortness of breath.  You notice blood in your stool or vomit. This may look like coffee grounds.  You have abdominal pain that increases or is concentrated in one small area (localized).  You have persistent vomiting or diarrhea.  You have a fever.  The patient is a child younger than 3 months, and he or she has a fever.  The patient is a child older than 3 months, and he or she has a fever and persistent symptoms.  The patient is a child older than 3 months, and he or she has a fever and symptoms suddenly get worse.  The patient is a baby, and he or she has no tears when crying. MAKE SURE YOU:   Understand these instructions.  Will watch your condition.  Will get help right away if you are not doing well or get worse.   This information is not intended to replace  advice given to you by your health care provider. Make sure you discuss any questions you have with your health care provider.   Document Released: 03/25/2005 Document Revised: 06/17/2011 Document Reviewed: 01/09/2011 Elsevier Interactive Patient Education 2016 Elsevier Inc[employer]  Viral Infections A viral infection can be caused by different types of viruses.Most viral  infections are not serious and resolve on their own. However, some infections may cause severe symptoms and may lead to further complications. SYMPTOMS Viruses can frequently cause:  Minor sore throat.  Aches and pains.  Headaches.  Runny nose.  Different types of rashes.  Watery eyes.  Tiredness.  Cough.  Loss of appetite.  Gastrointestinal infections, resulting in nausea, vomiting, and diarrhea. These symptoms do not respond to antibiotics because the infection is not caused by bacteria. However, you might catch a bacterial infection following the viral infection. This is sometimes called a "superinfection." Symptoms of such a bacterial infection may include:  Worsening sore throat with pus and difficulty swallowing.  Swollen neck glands.  Chills and a high or persistent fever.  Severe headache.  Tenderness over the sinuses.  Persistent overall ill feeling (malaise), muscle aches, and tiredness (fatigue).  Persistent cough.  Yellow, green, or brown mucus production with coughing. HOME CARE INSTRUCTIONS   Only take over-the-counter or prescription medicines for pain, discomfort, diarrhea, or fever as directed by your caregiver.  Drink enough water and fluids to keep your urine clear or pale yellow. Sports drinks can provide valuable electrolytes, sugars, and hydration.  Get plenty of rest and maintain proper nutrition. Soups and broths with crackers or rice are fine. SEEK IMMEDIATE MEDICAL CARE IF:   You have severe headaches, shortness of breath, chest pain, neck pain, or an unusual rash.  You have uncontrolled vomiting, diarrhea, or you are unable to keep down fluids.  You or your child has an oral temperature above 102 F (38.9 C), not controlled by medicine.  Your baby is older than 3 months with a rectal temperature of 102 F (38.9 C) or higher.  Your baby is 43 months old or younger with a rectal temperature of 100.4 F (38 C) or higher. MAKE SURE  YOU:   Understand these instructions.  Will watch your condition.  Will get help right away if you are not doing well or get worse.   This information is not intended to replace advice given to you by your health care provider. Make sure you discuss any questions you have with your health care provider.   Document Released: 01/02/2005 Document Revised: 06/17/2011 Document Reviewed: 08/31/2014 Elsevier Interactive Patient Education [employer].

## 2015-03-21 NOTE — ED Notes (Signed)
Pt reports cough and congestion with clear drainage/phlegm x 2 weeks and diarrhea that started last night. She indicated that she has had 12 episodes of diarrhea since midnight. Pt alert & oriented with warm, dry skin.

## 2015-05-22 ENCOUNTER — Encounter: Payer: Self-pay | Admitting: Emergency Medicine

## 2015-05-22 ENCOUNTER — Emergency Department
Admission: EM | Admit: 2015-05-22 | Discharge: 2015-05-22 | Disposition: A | Discharge status: Home / Self Care | Payer: Self-pay | Attending: Emergency Medicine | Admitting: Emergency Medicine

## 2015-05-22 ENCOUNTER — Emergency Department: Payer: Self-pay

## 2015-05-22 DIAGNOSIS — Z79899 Other long term (current) drug therapy: Secondary | ICD-10-CM | POA: Insufficient documentation

## 2015-05-22 DIAGNOSIS — I10 Essential (primary) hypertension: Secondary | ICD-10-CM | POA: Insufficient documentation

## 2015-05-22 DIAGNOSIS — F17210 Nicotine dependence, cigarettes, uncomplicated: Secondary | ICD-10-CM | POA: Insufficient documentation

## 2015-05-22 DIAGNOSIS — Z791 Long term (current) use of non-steroidal anti-inflammatories (NSAID): Secondary | ICD-10-CM | POA: Insufficient documentation

## 2015-05-22 DIAGNOSIS — R0789 Other chest pain: Secondary | ICD-10-CM

## 2015-05-22 DIAGNOSIS — J069 Acute upper respiratory infection, unspecified: Secondary | ICD-10-CM

## 2015-05-22 MED ORDER — ALBUTEROL SULFATE HFA 108 (90 BASE) MCG/ACT IN AERS: 2.0000 | INHALATION_SPRAY | Freq: Four times a day (QID) | RESPIRATORY_TRACT | Status: DC | PRN

## 2015-05-22 NOTE — ED Notes (Signed)
Patient to room 35.  Complaining of cough X 1 week with no improvement.  Alert and oriented.  Skin warm and dry.

## 2015-05-22 NOTE — Discharge Instructions (Signed)
Upper Respiratory Infection, Adult Most upper respiratory infections (URIs) are a viral infection of the air passages leading to the lungs. A URI affects the nose, throat, and upper air passages. The most common type of URI is nasopharyngitis and is typically referred to as "the common cold." URIs run their course and usually go away on their own. Most of the time, a URI does not require medical attention, but sometimes a bacterial infection in the upper airways can follow a viral infection. This is called a secondary infection. Sinus and middle ear infections are common types of secondary upper respiratory infections. Bacterial pneumonia can also complicate a URI. A URI can worsen asthma and chronic obstructive pulmonary disease (COPD). Sometimes, these complications can require emergency medical care and may be life threatening.  CAUSES Almost all URIs are caused by viruses. A virus is a type of germ and can spread from one person to another.  RISKS FACTORS You may be at risk for a URI if:   You smoke.   You have chronic heart or lung disease.  You have a weakened defense (immune) system.   You are very young or very old.   You have nasal allergies or asthma.  You work in crowded or poorly ventilated areas.  You work in health care facilities or schools. SIGNS AND SYMPTOMS  Symptoms typically develop 2-3 days after you come in contact with a cold virus. Most viral URIs last 7-10 days. However, viral URIs from the influenza virus (flu virus) can last 14-18 days and are typically more severe. Symptoms may include:   Runny or stuffy (congested) nose.   Sneezing.   Cough.   Sore throat.   Headache.   Fatigue.   Fever.   Loss of appetite.   Pain in your forehead, behind your eyes, and over your cheekbones (sinus pain).  Muscle aches.  DIAGNOSIS  Your health care provider may diagnose a URI by:  Physical exam.  Tests to check that your symptoms are not due to  another condition such as:  Strep throat.  Sinusitis.  Pneumonia.  Asthma. TREATMENT  A URI goes away on its own with time. It cannot be cured with medicines, but medicines may be prescribed or recommended to relieve symptoms. Medicines may help:  Reduce your fever.  Reduce your cough.  Relieve nasal congestion. HOME CARE INSTRUCTIONS   Take medicines only as directed by your health care provider.   Gargle warm saltwater or take cough drops to comfort your throat as directed by your health care provider.  Use a warm mist humidifier or inhale steam from a shower to increase air moisture. This may make it easier to breathe.  Drink enough fluid to keep your urine clear or pale yellow.   Eat soups and other clear broths and maintain good nutrition.   Rest as needed.   Return to work when your temperature has returned to normal or as your health care provider advises. You may need to stay home longer to avoid infecting others. You can also use a face mask and careful hand washing to prevent spread of the virus.  Increase the usage of your inhaler if you have asthma.   Do not use any tobacco products, including cigarettes, chewing tobacco, or electronic cigarettes. If you need help quitting, ask your health care provider. PREVENTION  The best way to protect yourself from getting a cold is to practice good hygiene.   Avoid oral or hand contact with people with cold  symptoms.   Wash your hands often if contact occurs.  There is no clear evidence that vitamin C, vitamin E, echinacea, or exercise reduces the chance of developing a cold. However, it is always recommended to get plenty of rest, exercise, and practice good nutrition.  SEEK MEDICAL CARE IF:   You are getting worse rather than better.   Your symptoms are not controlled by medicine.   You have chills.  You have worsening shortness of breath.  You have brown or red mucus.  You have yellow or brown nasal  discharge.  You have pain in your face, especially when you bend forward.  You have a fever.  You have swollen neck glands.  You have pain while swallowing.  You have white areas in the back of your throat. SEEK IMMEDIATE MEDICAL CARE IF:   You have severe or persistent:  Headache.  Ear pain.  Sinus pain.  Chest pain.  You have chronic lung disease and any of the following:  Wheezing.  Prolonged cough.  Coughing up blood.  A change in your usual mucus.  You have a stiff neck.  You have changes in your:  Vision.  Hearing.  Thinking.  Mood. MAKE SURE YOU:   Understand these instructions.  Will watch your condition.  Will get help right away if you are not doing well or get worse.   This information is not intended to replace advice given to you by your health care provider. Make sure you discuss any questions you have with your health care provider.   Document Released: 09/18/2000 Document Revised: 08/09/2014 Document Reviewed: 06/30/2013 Elsevier Interactive Patient Education 2016 Elsevier Inc.  Chest Wall Pain Chest wall pain is pain in or around the bones and muscles of your chest. Sometimes, an injury causes this pain. Sometimes, the cause may not be known. This pain may take several weeks or longer to get better. HOME CARE INSTRUCTIONS  Pay attention to any changes in your symptoms. Take these actions to help with your pain:   Rest as told by your health care provider.   Avoid activities that cause pain. These include any activities that use your chest muscles or your abdominal and side muscles to lift heavy items.   If directed, apply ice to the painful area:  Put ice in a plastic bag.  Place a towel between your skin and the bag.  Leave the ice on for 20 minutes, 2-3 times per day.  Take over-the-counter and prescription medicines only as told by your health care provider.  Do not use tobacco products, including cigarettes, chewing  tobacco, and e-cigarettes. If you need help quitting, ask your health care provider.  Keep all follow-up visits as told by your health care provider. This is important. SEEK MEDICAL CARE IF:  You have a fever.  Your chest pain becomes worse.  You have new symptoms. SEEK IMMEDIATE MEDICAL CARE IF:  You have nausea or vomiting.  You feel sweaty or light-headed.  You have a cough with phlegm (sputum) or you cough up blood.  You develop shortness of breath.   This information is not intended to replace advice given to you by your health care provider. Make sure you discuss any questions you have with your health care provider.   Document Released: 03/25/2005 Document Revised: 12/14/2014 Document Reviewed: 06/20/2014 Elsevier Interactive Patient Education [employer].

## 2015-05-22 NOTE — ED Provider Notes (Addendum)
[facility] Emergency Department Provider Note  ____________________________________________  Time seen: Approximately 115PM  I have reviewed the triage vital signs and the nursing notes.   HISTORY  Chief Complaint Cough and Nasal Congestion    HPI Sherry Little is a 45 y.o. female with a history of hypertension and a heart murmur who is presenting today with 1 week of runny nose with cough. She is concerned because several days ago she had blood in her sputum which is since resolved. She is having clear rhinorrhea at this time. Says that her cough is worse at night. Also with midsternal chest pain that is aching nonradiating and worse with a cough. She denies any shortness of breath. Denies any radiation of the pain. Denies any fevers but says that she has had some mild body aches. No known sick contacts but she does have exposure to the public at her job.   Past Medical History  Diagnosis Date  . Hypertension   . Heart murmur   . VSD (ventricular septal defect and aortic arch hypoplasia     There are no active problems to display for this patient.   Past Surgical History  Procedure Laterality Date  . Hand surgery      cyst romved from left thumb  . Tubal ligation    . Ganglion cyst excision  2008    left wrist    Current Outpatient Rx  Name  Route  Sig  Dispense  Refill  . albuterol (PROVENTIL HFA;VENTOLIN HFA) 108 (90 Base) MCG/ACT inhaler   Inhalation   Inhale 2 puffs into the lungs every 6 (six) hours as needed for wheezing or shortness of breath.   1 Inhaler   0   . ALPRAZolam (XANAX) 0.5 MG tablet   Oral   Take 1 tablet (0.5 mg total) by mouth at bedtime as needed for anxiety.   10 tablet   0   . ALPRAZolam (XANAX) 1 MG tablet   Oral   Take 1 mg by mouth 3 (three) times daily.         . diazepam (VALIUM) 2 MG tablet   Oral   Take 1 tablet (2 mg total) by mouth every 8 (eight) hours as needed for muscle spasms.   10 tablet  0   . diphenhydrAMINE (BENADRYL) 25 MG tablet   Oral   Take 1 tablet (25 mg total) by mouth every 6 (six) hours as needed for itching (Rash).   30 tablet   0   . etodolac (LODINE) 400 MG tablet   Oral   Take 1 tablet (400 mg total) by mouth 2 (two) times daily.   20 tablet   0   . famotidine (PEPCID) 20 MG tablet   Oral   Take 1 tablet (20 mg total) by mouth 2 (two) times daily.   10 tablet   0   . guaiFENesin (ROBITUSSIN) 100 MG/5ML SOLN   Oral   Take 5 mLs (100 mg total) by mouth every 4 (four) hours as needed for cough or to loosen phlegm.   120 mL   0   . loperamide (IMODIUM A-D) 2 MG tablet   Oral   Take 2 tablets (4 mg total) by mouth 4 (four) times daily as needed for diarrhea or loose stools.   30 tablet   0   . mirtazapine (REMERON) 30 MG tablet   Oral   Take 30 mg by mouth at bedtime.         Marland Kitchen  naproxen (NAPROSYN) 500 MG tablet   Oral   Take 1 tablet (500 mg total) by mouth 2 (two) times daily with a meal.   20 tablet   0   . naproxen sodium (ANAPROX) 220 MG tablet   Oral   Take 220 mg by mouth 2 (two) times daily as needed. For pain         . OLANZapine (ZYPREXA) 20 MG tablet   Oral   Take 20 mg by mouth at bedtime.         . predniSONE (DELTASONE) 20 MG tablet      40mg  (2 tablets) once by mouth today, then 20mg  (1 tablet) by mouth once daily starting tomorrow.   5 tablet   0   . promethazine (PHENERGAN) 25 MG tablet   Oral   Take 1 tablet (25 mg total) by mouth every 6 (six) hours as needed for nausea or vomiting.   15 tablet   0   . ranitidine (ZANTAC) 150 MG capsule   Oral   Take 1 capsule (150 mg total) by mouth 2 (two) times daily.   28 capsule   0   . tiZANidine (ZANAFLEX) 4 MG tablet   Oral   Take 1 tablet (4 mg total) by mouth 3 (three) times daily.   90 tablet   1     Allergies Mushroom ext cmplx(shiitake-reishi-mait) and Tramadol  No family history on file.  Social History Social History  Substance Use  Topics  . Smoking status: Current Every Day Smoker -- 0.50 packs/day    Types: Cigarettes  . Smokeless tobacco: None  . Alcohol Use: Yes     Comment: occas    Review of Systems Constitutional: No fever/chills Eyes: No visual changes. ENT: No sore throat. Cardiovascular: As above  Respiratory: Denies shortness of breath. Gastrointestinal: No abdominal pain.  No nausea, no vomiting.  No diarrhea.  No constipation. Genitourinary: Negative for dysuria. Musculoskeletal: Negative for back pain. Skin: Negative for rash. Neurological: Negative for headaches, focal weakness or numbness.  10-point ROS otherwise negative.  ____________________________________________   PHYSICAL EXAM:  VITAL SIGNS: ED Triage Vitals  Enc Vitals Group     BP 05/22/15 1158 163/87 mmHg     Pulse Rate 05/22/15 1158 60     Resp 05/22/15 1158 18     Temp 05/22/15 1158 98.6 F (37 C)     Temp Source 05/22/15 1158 Oral     SpO2 05/22/15 1158 100 %     Weight 05/22/15 1158 130 lb (58.968 kg)     Height 05/22/15 1158 5\' 2"  (1.575 m)     Head Cir --      Peak Flow --      Pain Score 05/22/15 1159 6     Pain Loc --      Pain Edu? --      Excl. in [place]? --     Constitutional: Alert and oriented. Well appearing and in no acute distress. Eyes: Conjunctivae are normal. PERRL. EOMI. Head: Atraumatic. Nose: Runny nasal mucosa bilaterally without any active bleeding but with prominent vasculature. Mouth/Throat: Mucous membranes are moist.  Oropharynx non-erythematous. Neck: No stridor.   Cardiovascular: Normal rate, regular rhythm. Grossly normal heart sounds.  Good peripheral circulation. Very mild tenderness palpation just at the base of the sternum. Respiratory: Normal respiratory effort.  No retractions. Lungs CTAB. Gastrointestinal: Soft and nontender. No distention. No abdominal bruits. No CVA tenderness. Musculoskeletal: No lower extremity tenderness nor edema.  No  joint effusions. Neurologic:  Normal  speech and language. No gross focal neurologic deficits are appreciated. No gait instability. Skin:  Skin is warm, dry and intact. No rash noted. Psychiatric: Mood and affect are normal. Speech and behavior are normal.  ____________________________________________   LABS (all labs ordered are listed, but only abnormal results are displayed)  Labs Reviewed - No data to display ____________________________________________  EKG  ED ECG REPORT I, Shogo Larkey,  Youlanda Roys, the attending physician, personally viewed and interpreted this ECG.   Date: 05/22/2015  EKG Time: 906  Rate: 75  Rhythm: normal sinus rhythm  Axis: Normal  Intervals:Incomplete right bundle branch block  ST&T Change: No ST segment elevation or depression. No abnormal T-wave inversion.  ____________________________________________  RADIOLOGY  No acute disease on the chest x-ray ____________________________________________   PROCEDURES   ____________________________________________   INITIAL IMPRESSION / ASSESSMENT AND PLAN / ED COURSE  Pertinent labs & imaging results that were available during my care of the patient were reviewed by me and considered in my medical decision making (see chart for details).  Patient denies any birth control. Patient is PE RC negative. Illicit be cardiac. Likely pleurisy and chest wall pain related to her cough. Patient requesting an inhaler. We'll give her prescription. We'll discharge to home. ____________________________________________   FINAL CLINICAL IMPRESSION(S) / ED DIAGNOSES  Final diagnoses:  URI (upper respiratory infection)   chest wall pain.    Orbie Pyo, MD 05/22/15 1352 Correction to EKG from above. See below for correct read ED ECG REPORT I, Holly Pring,  Youlanda Roys, the attending physician, personally viewed and interpreted this ECG.   Date: 05/22/2015  EKG Time: 1212  Rate: 55  Rhythm: sinus bradycardia  Axis: Normal axis   Intervals:none  ST&T Change: Sinus bradycardia but otherwise normal EKG. No ST elevation or depression. No abnormal T-wave inversion.   Orbie Pyo, MD 05/22/15 [id number]

## 2015-05-22 NOTE — ED Notes (Signed)
Patient presents to the ED with cough and congestion x 1 week.  Patient reports chest pain when she coughs or deep breathes.  Patient reports coughing is worse at night.  Patient states sputum she coughed up several days ago had blood in it.  Patient states now sputum is clear.  Patient denies fever.  Patient's respirations are even and nonlabored at this time.

## 2015-05-26 ENCOUNTER — Emergency Department
Admission: EM | Admit: 2015-05-26 | Discharge: 2015-05-26 | Disposition: A | Discharge status: Home / Self Care | Payer: No Typology Code available for payment source | Attending: Emergency Medicine | Admitting: Emergency Medicine

## 2015-05-26 ENCOUNTER — Encounter: Payer: Self-pay | Admitting: Emergency Medicine

## 2015-05-26 DIAGNOSIS — I10 Essential (primary) hypertension: Secondary | ICD-10-CM | POA: Insufficient documentation

## 2015-05-26 DIAGNOSIS — F17210 Nicotine dependence, cigarettes, uncomplicated: Secondary | ICD-10-CM | POA: Insufficient documentation

## 2015-05-26 DIAGNOSIS — B349 Viral infection, unspecified: Secondary | ICD-10-CM | POA: Insufficient documentation

## 2015-05-26 MED ORDER — ONDANSETRON 4 MG PO TBDP
4.0000 mg | ORAL_TABLET | Freq: Once | ORAL | Status: AC
  Administered 2015-05-26: 4 mg via ORAL
  Filled 2015-05-26: qty 1

## 2015-05-26 MED ORDER — BENZONATATE 100 MG PO CAPS
200.0000 mg | ORAL_CAPSULE | Freq: Once | ORAL | Status: AC
  Administered 2015-05-26: 200 mg via ORAL
  Filled 2015-05-26: qty 2

## 2015-05-26 MED ORDER — HYDROCOD POLST-CPM POLST ER 10-8 MG/5ML PO SUER: 5.0000 mL | Freq: Two times a day (BID) | ORAL | Status: DC

## 2015-05-26 MED ORDER — IBUPROFEN 800 MG PO TABS
800.0000 mg | ORAL_TABLET | Freq: Once | ORAL | Status: AC
  Administered 2015-05-26: 800 mg via ORAL
  Filled 2015-05-26: qty 1

## 2015-05-26 MED ORDER — HYDROCOD POLST-CPM POLST ER 10-8 MG/5ML PO SUER
5.0000 mL | Freq: Once | ORAL | Status: AC
  Administered 2015-05-26: 5 mL via ORAL
  Filled 2015-05-26: qty 5

## 2015-05-26 MED ORDER — ONDANSETRON HCL 4 MG PO TABS: 4.0000 mg | ORAL_TABLET | Freq: Every day | ORAL | Status: DC | PRN

## 2015-05-26 MED ORDER — AZITHROMYCIN 250 MG PO TABS: ORAL_TABLET | ORAL | Status: DC

## 2015-05-26 NOTE — ED Notes (Signed)
Pt seen here on Monday dx with bronchitis and given RX for inhaler. Now having diarrhea x2 episodes and vomiting x1 that started today.  Pt says cough is worse as well.

## 2015-05-26 NOTE — Discharge Instructions (Signed)
Viral Infections A viral infection can be caused by different types of viruses.Most viral infections are not serious and resolve on their own. However, some infections may cause severe symptoms and may lead to further complications. SYMPTOMS Viruses can frequently cause:  Minor sore throat.  Aches and pains.  Headaches.  Runny nose.  Different types of rashes.  Watery eyes.  Tiredness.  Cough.  Loss of appetite.  Gastrointestinal infections, resulting in nausea, vomiting, and diarrhea. These symptoms do not respond to antibiotics because the infection is not caused by bacteria. However, you might catch a bacterial infection following the viral infection. This is sometimes called a "superinfection." Symptoms of such a bacterial infection may include:  Worsening sore throat with pus and difficulty swallowing.  Swollen neck glands.  Chills and a high or persistent fever.  Severe headache.  Tenderness over the sinuses.  Persistent overall ill feeling (malaise), muscle aches, and tiredness (fatigue).  Persistent cough.  Yellow, green, or brown mucus production with coughing. HOME CARE INSTRUCTIONS   Only take over-the-counter or prescription medicines for pain, discomfort, diarrhea, or fever as directed by your caregiver.  Drink enough water and fluids to keep your urine clear or pale yellow. Sports drinks can provide valuable electrolytes, sugars, and hydration.  Get plenty of rest and maintain proper nutrition. Soups and broths with crackers or rice are fine. SEEK IMMEDIATE MEDICAL CARE IF:   You have severe headaches, shortness of breath, chest pain, neck pain, or an unusual rash.  You have uncontrolled vomiting, diarrhea, or you are unable to keep down fluids.  You or your child has an oral temperature above 102 F (38.9 C), not controlled by medicine.  Your baby is older than 3 months with a rectal temperature of 102 F (38.9 C) or higher.  Your baby is 52  months old or younger with a rectal temperature of 100.4 F (38 C) or higher. MAKE SURE YOU:   Understand these instructions.  Will watch your condition.  Will get help right away if you are not doing well or get worse.   This information is not intended to replace advice given to you by your health care provider. Make sure you discuss any questions you have with your health care provider.   Document Released: 01/02/2005 Document Revised: 06/17/2011 Document Reviewed: 08/31/2014 Elsevier Interactive Patient Education 2016 [employer]. Norovirus Infection A norovirus infection is caused by exposure to a virus in a group of similar viruses (noroviruses). This type of infection causes inflammation in your stomach and intestines (gastroenteritis). Norovirus is the most common cause of gastroenteritis. It also causes food poisoning. Anyone can get a norovirus infection. It spreads very easily (contagious). You can get it from contaminated food, water, surfaces, or other people. Norovirus is found in the stool or vomit of infected people. You can spread the infection as soon as you feel sick until 2 weeks after you recover.  Symptoms usually begin within 2 days after you become infected. Most norovirus symptoms affect the digestive system. CAUSES Norovirus infection is caused by contact with norovirus. You can catch norovirus if you:  Eat or drink something contaminated with norovirus.  Touch surfaces or objects contaminated with norovirus and then put your hand in your mouth.  Have direct contact with an infected person who has symptoms.  Share food, drink, or utensils with someone with who is sick with norovirus. SIGNS AND SYMPTOMS Symptoms of norovirus may include:  Nausea.  Vomiting.  Diarrhea.  Stomach cramps.  Fever.  Chills.  Headache.  Muscle aches.  Tiredness. DIAGNOSIS Your health care provider may suspect norovirus based on your symptoms and physical exam. Your  health care provider may also test a sample of your stool or vomit for the virus.  TREATMENT There is no specific treatment for norovirus. Most people get better without treatment in about 2 days. HOME CARE INSTRUCTIONS  Replace lost fluids by drinking plenty of water or rehydration fluids containing important minerals called electrolytes. This prevents dehydration. Drink enough fluid to keep your urine clear or pale yellow.  Do not prepare food for others while you are infected. Wait at least 3 days after recovering from the illness to do that. PREVENTION   Wash your hands often, especially after using the toilet or changing a diaper.  Wash fruits and vegetables thoroughly before preparing or serving them.  Throw out any food that a sick person may have touched.  Disinfect contaminated surfaces immediately after someone in the household has been sick. Use a bleach-based household cleaner.  Immediately remove and wash soiled clothes or sheets. SEEK MEDICAL CARE IF:  Your vomiting, diarrhea, and stomach pain is getting worse.  Your symptoms of norovirus do not go away after 2-3 days. SEEK IMMEDIATE MEDICAL CARE IF:  You develop symptoms of dehydration that do not improve with fluid replacement. This may include:  Excessive sleepiness.  Lack of tears.  Dry mouth.  Dizziness when standing.  Weak pulse.   This information is not intended to replace advice given to you by your health care provider. Make sure you discuss any questions you have with your health care provider.   Document Released: 06/15/2002 Document Revised: 04/15/2014 Document Reviewed: 09/02/2013 Elsevier Interactive Patient Education [employer].

## 2015-05-26 NOTE — ED Provider Notes (Signed)
[facility] Emergency Department Provider Note     Time seen: ----------------------------------------- 1:15 PM on 05/26/2015 -----------------------------------------    I have reviewed the triage vital signs and the nursing notes.   HISTORY  Chief Complaint Diarrhea; Emesis; and Cough    HPI Sherry Little is a 45 y.o. female who presents the ER for a recheck of her bronchitis. Patient states she was seen several days ago and diagnosed with bronchitis and given a prescription for an inhaler. She's not having diarrhea and vomiting that started today. Patient states the cough is much worse as well and she can't stop coughing.Nothing makes her symptoms better so far.   Past Medical History  Diagnosis Date  . Hypertension   . Heart murmur   . VSD (ventricular septal defect and aortic arch hypoplasia     There are no active problems to display for this patient.   Past Surgical History  Procedure Laterality Date  . Hand surgery      cyst romved from left thumb  . Tubal ligation    . Ganglion cyst excision  2008    left wrist    Allergies Mushroom ext cmplx(shiitake-reishi-mait) and Tramadol  Social History Social History  Substance Use Topics  . Smoking status: Current Every Day Smoker -- 0.50 packs/day    Types: Cigarettes  . Smokeless tobacco: None  . Alcohol Use: Yes     Comment: occas    Review of Systems Constitutional: Negative for fever. Eyes: Negative for visual changes. ENT: Negative for sore throat. Cardiovascular: Negative for chest pain. Respiratory: Negative for shortness of breath. Positive for cough Gastrointestinal: Negative for abdominal pain, positive for vomiting and diarrhea Genitourinary: Negative for dysuria. Musculoskeletal: Negative for back pain. Skin: Negative for rash. Neurological: Negative for headaches, focal weakness or numbness.  10-point ROS otherwise  negative.  ____________________________________________   PHYSICAL EXAM:  VITAL SIGNS: ED Triage Vitals  Enc Vitals Group     BP 05/26/15 0809 150/119 mmHg     Pulse Rate 05/26/15 0809 100     Resp 05/26/15 0809 20     Temp 05/26/15 0809 99.2 F (37.3 C)     Temp Source 05/26/15 0809 Oral     SpO2 05/26/15 0809 100 %     Weight 05/26/15 0809 130 lb (58.968 kg)     Height 05/26/15 0809 5\' 2"  (1.575 m)     Head Cir --      Peak Flow --      Pain Score 05/26/15 0809 10     Pain Loc --      Pain Edu? --      Excl. in [place]? --     Constitutional: Alert and oriented. Well appearing and in no distress. Eyes: Conjunctivae are normal. PERRL. Normal extraocular movements. ENT   Head: Normocephalic and atraumatic.   Nose: No congestion/rhinnorhea.   Mouth/Throat: Mucous membranes are moist.   Neck: No stridor. Cardiovascular: Normal rate, regular rhythm. Normal and symmetric distal pulses are present in all extremities. No murmurs, rubs, or gallops. Respiratory: Normal respiratory effort without tachypnea nor retractions. Breath sounds are clear and equal bilaterally. No wheezes/rales/rhonchi. Persistent coughing on examination with clear lungs Gastrointestinal: Soft and nontender. No distention. No abdominal bruits.  Musculoskeletal: Nontender with normal range of motion in all extremities. No joint effusions.  No lower extremity tenderness nor edema. Neurologic:  Normal speech and language. No gross focal neurologic deficits are appreciated. Speech is normal. No gait instability. Skin:  Skin is warm, dry and intact. No rash noted. Psychiatric: Mood and affect are normal. Speech and behavior are normal. Patient exhibits appropriate insight and judgment. ____________________________________________  ED COURSE:  Pertinent labs & imaging results that were available during my care of the patient were reviewed by me and considered in my medical decision making (see chart for  details). Patient likely with viral symptoms, she'll receive oral medications for cough and congestion as well as vomiting. ____________________________________________  FINAL ASSESSMENT AND PLAN  Viral syndrome  Plan: Patient with viral etiology for symptoms, will be discharged with cough medicines, antiemetics and antibiotics. She is stable for follow-up with her doctor.   Earleen Newport, MD   Earleen Newport, MD 05/26/15 [phone]

## 2015-05-26 NOTE — ED Notes (Signed)
Discharge instructions and prescriptions reviewed with patient. Patient verbalized understanding. Prescriptions and work note given to patient. Patient taken to lobby via wheelchair without difficulty.

## 2015-06-20 ENCOUNTER — Emergency Department: Payer: Self-pay

## 2015-06-20 ENCOUNTER — Encounter: Payer: Self-pay | Admitting: Emergency Medicine

## 2015-06-20 ENCOUNTER — Emergency Department
Admission: EM | Admit: 2015-06-20 | Discharge: 2015-06-20 | Disposition: A | Discharge status: Home / Self Care | Payer: Self-pay | Attending: Emergency Medicine | Admitting: Emergency Medicine

## 2015-06-20 DIAGNOSIS — S63601A Unspecified sprain of right thumb, initial encounter: Secondary | ICD-10-CM | POA: Insufficient documentation

## 2015-06-20 DIAGNOSIS — X58XXXA Exposure to other specified factors, initial encounter: Secondary | ICD-10-CM | POA: Insufficient documentation

## 2015-06-20 DIAGNOSIS — Y998 Other external cause status: Secondary | ICD-10-CM | POA: Insufficient documentation

## 2015-06-20 DIAGNOSIS — F17210 Nicotine dependence, cigarettes, uncomplicated: Secondary | ICD-10-CM | POA: Insufficient documentation

## 2015-06-20 DIAGNOSIS — Y9289 Other specified places as the place of occurrence of the external cause: Secondary | ICD-10-CM | POA: Insufficient documentation

## 2015-06-20 DIAGNOSIS — I10 Essential (primary) hypertension: Secondary | ICD-10-CM | POA: Insufficient documentation

## 2015-06-20 DIAGNOSIS — Y9389 Activity, other specified: Secondary | ICD-10-CM | POA: Insufficient documentation

## 2015-06-20 DIAGNOSIS — Z79899 Other long term (current) drug therapy: Secondary | ICD-10-CM | POA: Insufficient documentation

## 2015-06-20 MED ORDER — NAPROXEN 500 MG PO TABS: 500.0000 mg | ORAL_TABLET | Freq: Two times a day (BID) | ORAL | Status: DC

## 2015-06-20 NOTE — Discharge Instructions (Signed)
Please get a thumb spica splint at a local pharmacy or medical supply store.   Cryotherapy Cryotherapy is when you put ice on your injury. Ice helps lessen pain and puffiness (swelling) after an injury. Ice works the best when you start using it in the first 24 to 48 hours after an injury. HOME CARE  Put a dry or damp towel between the ice pack and your skin.  You may press gently on the ice pack.  Leave the ice on for no more than 10 to 20 minutes at a time.  Check your skin after 5 minutes to make sure your skin is okay.  Rest at least 20 minutes between ice pack uses.  Stop using ice when your skin loses feeling (numbness).  Do not use ice on someone who cannot tell you when it hurts. This includes small children and people with memory problems (dementia). GET HELP RIGHT AWAY IF:  You have white spots on your skin.  Your skin turns blue or pale.  Your skin feels waxy or hard.  Your puffiness gets worse. MAKE SURE YOU:   Understand these instructions.  Will watch your condition.  Will get help right away if you are not doing well or get worse.   This information is not intended to replace advice given to you by your health care provider. Make sure you discuss any questions you have with your health care provider.   Document Released: 09/11/2007 Document Revised: 06/17/2011 Document Reviewed: 11/15/2010 Elsevier Interactive Patient Education 2016 Else[employer] Inc[employer]  Thumb Sprain A thumb sprain is an injury to one of the strong bands of tissue (ligaments) that connect the bones in your thumb. The ligament can be stretched too much or it can tear. A tear can be either partial or complete. The severity of the sprain depends on how much of the ligament was damaged or torn. CAUSES A thumb sprain is often caused by a fall or an accident. If you extend your hands to catch an object or to protect yourself, the force of the impact can cause your ligament to stretch too much. This  excess tension can also cause your ligament to tear. RISK FACTORS This injury is more likely to occur in people who play:  Sports that involve a greater risk of falling, such as skiing.  Sports that involve catching an object, such as basketball. SYMPTOMS Symptoms of this condition include:  Loss of motion in your thumb.  Bruising.  Tenderness.  Swelling. DIAGNOSIS This condition is diagnosed with a medical history and physical exam. You may also have an X-ray of your thumb. TREATMENT Treatment varies depending on the severity of your sprain. If your ligament is overstretched or partially torn, treatment usually involves keeping your thumb in a fixed position (immobilization) for a period of time. To help you do this, your health care provider will apply a bandage, cast, or splint to keep your thumb from moving until it heals. If your ligament is fully torn, you may need surgery to reconnect the ligament to the bone. After surgery, a cast or splint will be applied and will need to stay on your thumb while it heals. Your health care provider may also suggest exercises or physical therapy to strengthen your thumb. HOME CARE INSTRUCTIONS If You Have a Cast:  Do not stick anything inside the cast to scratch your skin. Doing that increases your risk of infection.  Check the skin around the cast every day. Report any concerns to your  health care provider. You may put lotion on dry skin around the edges of the cast. Do not apply lotion to the skin underneath the cast.  Keep the cast clean and dry. If You Have a Splint:  Wear it as directed by your health care provider. Remove it only as directed by your health care provider.  Loosen the splint if your fingers become numb and tingle, or if they turn cold and blue.  Keep the splint clean and dry. Bathing  Cover the bandage, cast, or splint with a watertight plastic bag to protect it from water while you take a bath or a shower. Do not  let the bandage, cast, or splint get wet. Managing Pain, Stiffness, and Swelling   If directed, apply ice to the injured area (unless you have a cast):  Put ice in a plastic bag.  Place a towel between your skin and the bag.  Leave the ice on for 20 minutes, 2-3 times per day.  Move your fingers often to avoid stiffness and to lessen swelling.  Raise (elevate) the injured area above the level of your heart while you are sitting or lying down. Driving  Do not drive or operate heavy machinery while taking pain medicine.  Do not drive while wearing a cast or splint on a hand that you use for driving. General Instructions  Do not put pressure on any part of your cast or splint until it is fully hardened. This may take several hours.  Take medicines only as directed by your health care provider. These include over-the-counter medicines and prescription medicines.  Keep all follow-up visits as directed by your health care provider. This is important.  Do any exercise or physical therapy as directed by your health care provider.  Do not wear rings on your injured thumb. SEEK MEDICAL CARE IF:  Your pain is not controlled with medicine.  Your bruising or swelling gets worse.  Your cast or splint is damaged. SEEK IMMEDIATE MEDICAL CARE IF:  Your thumb is numb or blue.  Your thumb feels colder than normal.   This information is not intended to replace advice given to you by your health care provider. Make sure you discuss any questions you have with your health care provider.   Document Released: 05/02/2004 Document Revised: 08/09/2014 Document Reviewed: 01/04/2014 Elsevier Interactive Patient Education [employer].

## 2015-06-20 NOTE — ED Provider Notes (Signed)
[facility] Emergency Department Provider Note  ____________________________________________  Time seen: Approximately 10:15 AM  I have reviewed the triage vital signs and the nursing notes.   HISTORY  Chief Complaint Hand Pain    HPI Sherry Little is a 45 y.o. female , NAD, presents to the emergency department with 2 week history of right thumb pain. States she had an incident in which her right thumb was bent backwards. Has had pain about the right thumb since that time. Has been treating at home with anti-inflammatories and ice with no significant improvement. States she had a similar incident a couple of years ago but she never sought medical treatment. Denies numbness, weakness, tingling. Has full range of motion of the left hand, wrist, fingers. Denies any open wounds or lacerations.   Past Medical History  Diagnosis Date  . Hypertension   . Heart murmur   . VSD (ventricular septal defect and aortic arch hypoplasia     There are no active problems to display for this patient.   Past Surgical History  Procedure Laterality Date  . Hand surgery      cyst romved from left thumb  . Tubal ligation    . Ganglion cyst excision  2008    left wrist    Current Outpatient Rx  Name  Route  Sig  Dispense  Refill  . albuterol (PROVENTIL HFA;VENTOLIN HFA) 108 (90 Base) MCG/ACT inhaler   Inhalation   Inhale 2 puffs into the lungs every 6 (six) hours as needed for wheezing or shortness of breath.   1 Inhaler   0   . ALPRAZolam (XANAX) 0.5 MG tablet   Oral   Take 1 tablet (0.5 mg total) by mouth at bedtime as needed for anxiety.   10 tablet   0   . ALPRAZolam (XANAX) 1 MG tablet   Oral   Take 1 mg by mouth 3 (three) times daily.         . diazepam (VALIUM) 2 MG tablet   Oral   Take 1 tablet (2 mg total) by mouth every 8 (eight) hours as needed for muscle spasms.   10 tablet   0   . diphenhydrAMINE (BENADRYL) 25 MG tablet   Oral   Take 1  tablet (25 mg total) by mouth every 6 (six) hours as needed for itching (Rash).   30 tablet   0   . famotidine (PEPCID) 20 MG tablet   Oral   Take 1 tablet (20 mg total) by mouth 2 (two) times daily.   10 tablet   0   . guaiFENesin (ROBITUSSIN) 100 MG/5ML SOLN   Oral   Take 5 mLs (100 mg total) by mouth every 4 (four) hours as needed for cough or to loosen phlegm.   120 mL   0   . loperamide (IMODIUM A-D) 2 MG tablet   Oral   Take 2 tablets (4 mg total) by mouth 4 (four) times daily as needed for diarrhea or loose stools.   30 tablet   0   . mirtazapine (REMERON) 30 MG tablet   Oral   Take 30 mg by mouth at bedtime.         . naproxen (NAPROSYN) 500 MG tablet   Oral   Take 1 tablet (500 mg total) by mouth 2 (two) times daily with a meal.   14 tablet   0   . OLANZapine (ZYPREXA) 20 MG tablet   Oral   Take  20 mg by mouth at bedtime.         . ondansetron (ZOFRAN) 4 MG tablet   Oral   Take 1 tablet (4 mg total) by mouth daily as needed for nausea or vomiting.   20 tablet   1   . promethazine (PHENERGAN) 25 MG tablet   Oral   Take 1 tablet (25 mg total) by mouth every 6 (six) hours as needed for nausea or vomiting.   15 tablet   0   . ranitidine (ZANTAC) 150 MG capsule   Oral   Take 1 capsule (150 mg total) by mouth 2 (two) times daily.   28 capsule   0   . tiZANidine (ZANAFLEX) 4 MG tablet   Oral   Take 1 tablet (4 mg total) by mouth 3 (three) times daily.   90 tablet   1     Allergies Mushroom ext cmplx(shiitake-reishi-mait) and Tramadol  No family history on file.  Social History Social History  Substance Use Topics  . Smoking status: Current Every Day Smoker -- 0.50 packs/day    Types: Cigarettes  . Smokeless tobacco: None  . Alcohol Use: Yes     Comment: occas     Review of Systems  Constitutional: No fever/chills Cardiovascular: No chest pain. Respiratory: No shortness of breath. No wheezing.  Musculoskeletal: Positive right  thumb pain. Negative for left hand, wrist pain.  Skin: Positive swelling about right thumb. Negative for rash, lacerations. Neurological: Negative for headaches, focal weakness or numbness. No tingling. 10-point ROS otherwise negative.  ____________________________________________   PHYSICAL EXAM:  VITAL SIGNS: ED Triage Vitals  Enc Vitals Group     BP 06/20/15 0923 151/70 mmHg     Pulse Rate 06/20/15 0923 60     Resp 06/20/15 0923 18     Temp 06/20/15 0923 98.4 F (36.9 C)     Temp Source 06/20/15 0923 Oral     SpO2 06/20/15 0923 98 %     Weight 06/20/15 0923 130 lb (58.968 kg)     Height 06/20/15 0923 5\' 2"  (1.575 m)     Head Cir --      Peak Flow --      Pain Score 06/20/15 0922 10     Pain Loc --      Pain Edu? --      Excl. in [place]? --     Constitutional: Alert and oriented. Well appearing and in no acute distress. Eyes: Conjunctivae are normal.  Head: Atraumatic. Cardiovascular:  Good peripheral circulation. Respiratory: Normal respiratory effort without tachypnea or retractions.  Musculoskeletal: Tender to palpation about right base of thumb about the MTP. No crepitus to manipulation. No edema.  No joint effusions. Neurologic:  Normal speech and language. No gross focal neurologic deficits are appreciated.  Skin:  Skin is warm, dry and intact. No rash noted. Mild swelling about the MTP but no warmth or redness. Psychiatric: Mood and affect are normal. Speech and behavior are normal. Patient exhibits appropriate insight and judgement.   ____________________________________________   LABS  None  ____________________________________________  EKG  None ____________________________________________  RADIOLOGY I have personally viewed and evaluated these images (plain radiographs) as part of my medical decision making, as well as reviewing the written report by the radiologist.  Dg Finger Thumb Right  06/20/2015  CLINICAL DATA:  Right thumb pain for 2 weeks.  Hyperextension injury. EXAM: RIGHT THUMB 2+V COMPARISON:  None. FINDINGS: The joint spaces are maintained.  No acute fractures identified.  IMPRESSION: No acute bony findings or significant degenerative changes. Electronically Signed   By: Marijo Sanes M.D.   On: 06/20/2015 10:16    ____________________________________________    PROCEDURES  Procedure(s) performed: None    Medications - No data to display   ____________________________________________   INITIAL IMPRESSION / ASSESSMENT AND PLAN / ED COURSE  Pertinent imaging results that were available during my care of the patient were reviewed by me and considered in my medical decision making (see chart for details).  Patient's diagnosis is consistent with right thumb sprain. Patient will be discharged home with prescriptions for Naprosyn to use as directed. She is advised to get a thumb spica splint at a local pharmacy or medical supply store and utilize for 3-5 days. Patient is to follow up with her primary care provider at [facility] or may see Dr. Mack Guise in orthopedics if symptoms persist past this treatment course. Patient is given ED precautions to return to the ED for any worsening or new symptoms.    ____________________________________________  FINAL CLINICAL IMPRESSION(S) / ED DIAGNOSES  Final diagnoses:  Thumb sprain, right, initial encounter      NEW MEDICATIONS STARTED DURING THIS VISIT:  Discharge Medication List as of 06/20/2015 10:22 AM           Braxton Feathers, PA-C 06/20/15 1046  Lavonia Drafts, MD 06/20/15 1215

## 2015-06-20 NOTE — ED Notes (Signed)
Pt to ed with c/o right thumb, reports she pulled it backwards about 1 week ago.  States thumb becomes stiff and painful.  Good rom in right thumb.  +pulse and sensation.

## 2015-06-20 NOTE — ED Notes (Signed)
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

## 2015-06-29 ENCOUNTER — Emergency Department: Payer: No Typology Code available for payment source

## 2015-06-29 ENCOUNTER — Encounter: Payer: Self-pay | Admitting: Emergency Medicine

## 2015-06-29 ENCOUNTER — Emergency Department
Admission: EM | Admit: 2015-06-29 | Discharge: 2015-06-29 | Disposition: A | Discharge status: Home / Self Care | Payer: No Typology Code available for payment source | Attending: Emergency Medicine | Admitting: Emergency Medicine

## 2015-06-29 DIAGNOSIS — Z79899 Other long term (current) drug therapy: Secondary | ICD-10-CM | POA: Insufficient documentation

## 2015-06-29 DIAGNOSIS — Z791 Long term (current) use of non-steroidal anti-inflammatories (NSAID): Secondary | ICD-10-CM | POA: Insufficient documentation

## 2015-06-29 DIAGNOSIS — Y9389 Activity, other specified: Secondary | ICD-10-CM | POA: Insufficient documentation

## 2015-06-29 DIAGNOSIS — I10 Essential (primary) hypertension: Secondary | ICD-10-CM | POA: Insufficient documentation

## 2015-06-29 DIAGNOSIS — X58XXXA Exposure to other specified factors, initial encounter: Secondary | ICD-10-CM | POA: Insufficient documentation

## 2015-06-29 DIAGNOSIS — Y990 Civilian activity done for income or pay: Secondary | ICD-10-CM | POA: Insufficient documentation

## 2015-06-29 DIAGNOSIS — F17210 Nicotine dependence, cigarettes, uncomplicated: Secondary | ICD-10-CM | POA: Insufficient documentation

## 2015-06-29 DIAGNOSIS — Y9289 Other specified places as the place of occurrence of the external cause: Secondary | ICD-10-CM | POA: Insufficient documentation

## 2015-06-29 DIAGNOSIS — S63601A Unspecified sprain of right thumb, initial encounter: Secondary | ICD-10-CM | POA: Insufficient documentation

## 2015-06-29 MED ORDER — OXYCODONE-ACETAMINOPHEN 5-325 MG PO TABS: 1.0000 | ORAL_TABLET | Freq: Four times a day (QID) | ORAL | Status: DC | PRN

## 2015-06-29 NOTE — ED Notes (Signed)
See triage   Having pain to right hand/thumb area while working today  Min swelling noted   Increased pain with movement

## 2015-06-29 NOTE — ED Notes (Signed)
Pt to ed with c/o hitting right hand thumb at work today,  Reports increased pain since and worse with movement of right hand.

## 2015-06-29 NOTE — Discharge Instructions (Signed)
°  Finger Sprain A finger sprain happens when the bands of tissue that hold the finger bones together (ligaments) stretch too much and tear. HOME CARE  Keep your injured finger raised (elevated) when possible.  Put ice on the injured area, twice a day, for 2 to 3 days.  Put ice in a plastic bag.  Place a towel between your skin and the bag.  Leave the ice on for 15 minutes.  Only take medicine as told by your doctor.  Do not wear rings on the injured finger.  Protect your finger until pain and stiffness go away (usually 3 to 4 weeks).  Do not get your cast or splint to get wet. Cover your cast or splint with a plastic bag when you shower or bathe. Do not swim.  Your doctor may suggest special exercises for you to do. These exercises will help keep or stop stiffness from happening. GET HELP RIGHT AWAY IF:  Your cast or splint gets damaged.  Your pain gets worse, not better. MAKE SURE YOU:  Understand these instructions.  Will watch your condition.  Will get help right away if you are not doing well or get worse.   This information is not intended to replace advice given to you by your health care provider. Make sure you discuss any questions you have with your health care provider.   Document Released: 04/27/2010 Document Revised: 06/17/2011 Document Reviewed: 11/26/2010 Elsevier Interactive Patient Education [employer].  wear a splint when not working.

## 2015-06-29 NOTE — ED Provider Notes (Signed)
[facility] Emergency Department Provider Note  ____________________________________________  Time seen: Approximately 12:42 PM  I have reviewed the triage vital signs and the nursing notes.   HISTORY  Chief Complaint Hand Pain    HPI Sherry Little is a 45 y.o. female is complaining of right thumb pain secondary to a contusion which occurred at work today. Patient states she's had pain with extension and flexion of the right thumb. Patient rates the pain as a 10 over 10. Patient had a previous injury to the same digit 9 days ago. Patient was seen and treated at this emergency Department. No palliative measures prior to arrival.   Past Medical History  Diagnosis Date  . Hypertension   . Heart murmur   . VSD (ventricular septal defect and aortic arch hypoplasia     There are no active problems to display for this patient.   Past Surgical History  Procedure Laterality Date  . Hand surgery      cyst romved from left thumb  . Tubal ligation    . Ganglion cyst excision  2008    left wrist    Current Outpatient Rx  Name  Route  Sig  Dispense  Refill  . albuterol (PROVENTIL HFA;VENTOLIN HFA) 108 (90 Base) MCG/ACT inhaler   Inhalation   Inhale 2 puffs into the lungs every 6 (six) hours as needed for wheezing or shortness of breath.   1 Inhaler   0   . ALPRAZolam (XANAX) 0.5 MG tablet   Oral   Take 1 tablet (0.5 mg total) by mouth at bedtime as needed for anxiety.   10 tablet   0   . ALPRAZolam (XANAX) 1 MG tablet   Oral   Take 1 mg by mouth 3 (three) times daily.         . diazepam (VALIUM) 2 MG tablet   Oral   Take 1 tablet (2 mg total) by mouth every 8 (eight) hours as needed for muscle spasms.   10 tablet   0   . diphenhydrAMINE (BENADRYL) 25 MG tablet   Oral   Take 1 tablet (25 mg total) by mouth every 6 (six) hours as needed for itching (Rash).   30 tablet   0   . famotidine (PEPCID) 20 MG tablet   Oral   Take 1 tablet (20 mg  total) by mouth 2 (two) times daily.   10 tablet   0   . guaiFENesin (ROBITUSSIN) 100 MG/5ML SOLN   Oral   Take 5 mLs (100 mg total) by mouth every 4 (four) hours as needed for cough or to loosen phlegm.   120 mL   0   . loperamide (IMODIUM A-D) 2 MG tablet   Oral   Take 2 tablets (4 mg total) by mouth 4 (four) times daily as needed for diarrhea or loose stools.   30 tablet   0   . mirtazapine (REMERON) 30 MG tablet   Oral   Take 30 mg by mouth at bedtime.         . naproxen (NAPROSYN) 500 MG tablet   Oral   Take 1 tablet (500 mg total) by mouth 2 (two) times daily with a meal.   14 tablet   0   . OLANZapine (ZYPREXA) 20 MG tablet   Oral   Take 20 mg by mouth at bedtime.         . ondansetron (ZOFRAN) 4 MG tablet   Oral  Take 1 tablet (4 mg total) by mouth daily as needed for nausea or vomiting.   20 tablet   1   . promethazine (PHENERGAN) 25 MG tablet   Oral   Take 1 tablet (25 mg total) by mouth every 6 (six) hours as needed for nausea or vomiting.   15 tablet   0   . ranitidine (ZANTAC) 150 MG capsule   Oral   Take 1 capsule (150 mg total) by mouth 2 (two) times daily.   28 capsule   0   . tiZANidine (ZANAFLEX) 4 MG tablet   Oral   Take 1 tablet (4 mg total) by mouth 3 (three) times daily.   90 tablet   1     Allergies Mushroom ext cmplx(shiitake-reishi-mait) and Tramadol  History reviewed. No pertinent family history.  Social History Social History  Substance Use Topics  . Smoking status: Current Every Day Smoker -- 0.50 packs/day    Types: Cigarettes  . Smokeless tobacco: None  . Alcohol Use: Yes     Comment: occas    Review of Systems Constitutional: No fever/chills Eyes: No visual changes. ENT: No sore throat. Cardiovascular: Denies chest pain. Respiratory: Denies shortness of breath. Gastrointestinal: No abdominal pain.  No nausea, no vomiting.  No diarrhea.  No constipation. Genitourinary: Negative for  dysuria. Musculoskeletal: Negative for back pain. Skin: Negative for rash. Neurological: Negative for headaches, focal weakness or numbness.    ____________________________________________   PHYSICAL EXAM:  VITAL SIGNS: ED Triage Vitals  Enc Vitals Group     BP 06/29/15 1131 150/64 mmHg     Pulse Rate 06/29/15 1131 61     Resp 06/29/15 1131 18     Temp 06/29/15 1131 98.1 F (36.7 C)     Temp Source 06/29/15 1131 Oral     SpO2 06/29/15 1131 100 %     Weight 06/29/15 1131 130 lb (58.968 kg)     Height 06/29/15 1131 5\' 2"  (1.575 m)     Head Cir --      Peak Flow --      Pain Score 06/29/15 1131 10     Pain Loc --      Pain Edu? --      Excl. in [place]? --     Constitutional: Alert and oriented. Well appearing and in no acute distress. Eyes: Conjunctivae are normal. PERRL. EOMI. Head: Atraumatic. Nose: No congestion/rhinnorhea. Mouth/Throat: Mucous membranes are moist.  Oropharynx non-erythematous. Neck: No stridor.  No cervical spine tenderness to palpation. Hematological/Lymphatic/Immunilogical: No cervical lymphadenopathy. Cardiovascular: Normal rate, regular rhythm. Grossly normal heart sounds.  Good peripheral circulation. Respiratory: Normal respiratory effort.  No retractions. Lungs CTAB. Gastrointestinal: Soft and nontender. No distention. No abdominal bruits. No CVA tenderness. Genitourinary:  Musculoskeletal: No lower extremity tenderness nor edema.  No joint effusions. Neurologic:  Normal speech and language. No gross focal neurologic deficits are appreciated. No gait instability. Skin:  Skin is warm, dry and intact. No rash noted. Psychiatric: Mood and affect are normal. Speech and behavior are normal.  ____________________________________________   LABS (all labs ordered are listed, but only abnormal results are displayed)  Labs Reviewed - No data to  display ____________________________________________  EKG   ____________________________________________  RADIOLOGY  No acute findings on x-ray ____________________________________________   PROCEDURES  Procedure(s) performed: None  Critical Care performed: No  ____________________________________________   INITIAL IMPRESSION / ASSESSMENT AND PLAN / ED COURSE  Pertinent labs & imaging results that were available during my care of  the patient were reviewed by me and considered in my medical decision making (see chart for details).  Pain right thumb. Discussed negative x-ray finding with patient. Patient advised to reapply thumb splint when not working. Patient given a work note for the day and a prescription for tramadol. FINAL CLINICAL IMPRESSION(S) / ED DIAGNOSES  Final diagnoses:  Sprain of right thumb, initial encounter      Sable Feil, PA-C 06/29/15 [place], PA-C 06/29/15 1353

## 2015-09-07 ENCOUNTER — Emergency Department
Admission: EM | Admit: 2015-09-07 | Discharge: 2015-09-07 | Disposition: A | Discharge status: Home / Self Care | Payer: Self-pay | Attending: Emergency Medicine | Admitting: Emergency Medicine

## 2015-09-07 ENCOUNTER — Encounter: Payer: Self-pay | Admitting: *Deleted

## 2015-09-07 DIAGNOSIS — Q2542 Hypoplasia of aorta: Secondary | ICD-10-CM | POA: Insufficient documentation

## 2015-09-07 DIAGNOSIS — Q210 Ventricular septal defect: Secondary | ICD-10-CM | POA: Insufficient documentation

## 2015-09-07 DIAGNOSIS — I10 Essential (primary) hypertension: Secondary | ICD-10-CM | POA: Insufficient documentation

## 2015-09-07 DIAGNOSIS — Z79899 Other long term (current) drug therapy: Secondary | ICD-10-CM | POA: Insufficient documentation

## 2015-09-07 DIAGNOSIS — F17210 Nicotine dependence, cigarettes, uncomplicated: Secondary | ICD-10-CM | POA: Insufficient documentation

## 2015-09-07 DIAGNOSIS — E860 Dehydration: Secondary | ICD-10-CM | POA: Insufficient documentation

## 2015-09-07 DIAGNOSIS — F1290 Cannabis use, unspecified, uncomplicated: Secondary | ICD-10-CM | POA: Insufficient documentation

## 2015-09-07 DIAGNOSIS — G43809 Other migraine, not intractable, without status migrainosus: Secondary | ICD-10-CM | POA: Insufficient documentation

## 2015-09-07 MED ORDER — METOCLOPRAMIDE HCL 5 MG/ML IJ SOLN
10.0000 mg | Freq: Once | INTRAMUSCULAR | Status: AC
  Administered 2015-09-07: 10 mg via INTRAVENOUS
  Filled 2015-09-07: qty 2

## 2015-09-07 MED ORDER — KETOROLAC TROMETHAMINE 10 MG PO TABS: 10.0000 mg | ORAL_TABLET | Freq: Three times a day (TID) | ORAL | Status: DC | PRN

## 2015-09-07 MED ORDER — SODIUM CHLORIDE 0.9 % IV BOLUS (SEPSIS)
1000.0000 mL | Freq: Once | INTRAVENOUS | Status: AC
Start: 2015-09-07 — End: 2015-09-07
  Administered 2015-09-07: 1000 mL via INTRAVENOUS

## 2015-09-07 MED ORDER — HYDROMORPHONE HCL 1 MG/ML IJ SOLN
0.5000 mg | Freq: Once | INTRAMUSCULAR | Status: AC
  Administered 2015-09-07: 0.5 mg via INTRAVENOUS
  Filled 2015-09-07: qty 1

## 2015-09-07 MED ORDER — ONDANSETRON HCL 4 MG PO TABS: 4.0000 mg | ORAL_TABLET | Freq: Three times a day (TID) | ORAL | Status: DC | PRN

## 2015-09-07 MED ORDER — DIPHENHYDRAMINE HCL 50 MG/ML IJ SOLN
25.0000 mg | Freq: Once | INTRAMUSCULAR | Status: AC
  Administered 2015-09-07: 25 mg via INTRAVENOUS
  Filled 2015-09-07: qty 1

## 2015-09-07 MED ORDER — KETOROLAC TROMETHAMINE 30 MG/ML IJ SOLN
30.0000 mg | Freq: Once | INTRAMUSCULAR | Status: AC
  Administered 2015-09-07: 30 mg via INTRAVENOUS
  Filled 2015-09-07: qty 1

## 2015-09-07 NOTE — Discharge Instructions (Signed)
Please drink plenty of fluid and get plenty of rest to prevent migraines. If you do develop a headache, please take Toradol for pain and Zofran for nausea.  Return to the emergency department if you develop severe pain, fever, inability to keep down fluids, or any other symptoms concerning to you.

## 2015-09-07 NOTE — ED Provider Notes (Signed)
[facility] Emergency Department Provider Note  ____________________________________________  Time seen: Approximately 4:08 PM  I have reviewed the triage vital signs and the nursing notes.   HISTORY  Chief Complaint Headache    HPI Sherry Little is a 45 y.o. female with a history of migraines, HTN, VSD presenting with headache. The patient reports that she has been very stressed at work and has not had time to eat and drink appropriately. This morning she woke up with a right-sided headache that radiates around behind her ear and down her neck. She tried 2 Tylenol which did not improve her headache. She has had associated nausea without vomiting. No photophobia or phonophobia. No fever, neck pain or stiff neck, trauma, tick bites, visual or speech changes. No numbness tingling or weakness. This is not the worse headache of her life and has gotten progressively worse throughout the day.  FH: No family history of brain tumor.  Past Medical History  Diagnosis Date  . Hypertension   . Heart murmur   . VSD (ventricular septal defect and aortic arch hypoplasia     There are no active problems to display for this patient.   Past Surgical History  Procedure Laterality Date  . Hand surgery      cyst romved from left thumb  . Tubal ligation    . Ganglion cyst excision  2008    left wrist    Current Outpatient Rx  Name  Route  Sig  Dispense  Refill  . albuterol (PROVENTIL HFA;VENTOLIN HFA) 108 (90 Base) MCG/ACT inhaler   Inhalation   Inhale 2 puffs into the lungs every 6 (six) hours as needed for wheezing or shortness of breath.   1 Inhaler   0   . ALPRAZolam (XANAX) 0.5 MG tablet   Oral   Take 1 tablet (0.5 mg total) by mouth at bedtime as needed for anxiety.   10 tablet   0   . ALPRAZolam (XANAX) 1 MG tablet   Oral   Take 1 mg by mouth 3 (three) times daily.         . diphenhydrAMINE (BENADRYL) 25 MG tablet   Oral   Take 1 tablet (25 mg  total) by mouth every 6 (six) hours as needed for itching (Rash).   30 tablet   0   . famotidine (PEPCID) 20 MG tablet   Oral   Take 1 tablet (20 mg total) by mouth 2 (two) times daily.   10 tablet   0   . guaiFENesin (ROBITUSSIN) 100 MG/5ML SOLN   Oral   Take 5 mLs (100 mg total) by mouth every 4 (four) hours as needed for cough or to loosen phlegm.   120 mL   0   . ketorolac (TORADOL) 10 MG tablet   Oral   Take 1 tablet (10 mg total) by mouth every 8 (eight) hours as needed for moderate pain (with food).   15 tablet   0   . loperamide (IMODIUM A-D) 2 MG tablet   Oral   Take 2 tablets (4 mg total) by mouth 4 (four) times daily as needed for diarrhea or loose stools.   30 tablet   0   . mirtazapine (REMERON) 30 MG tablet   Oral   Take 30 mg by mouth at bedtime.         . naproxen (NAPROSYN) 500 MG tablet   Oral   Take 1 tablet (500 mg total) by mouth 2 (two)  times daily with a meal.   14 tablet   0   . OLANZapine (ZYPREXA) 20 MG tablet   Oral   Take 20 mg by mouth at bedtime.         . ondansetron (ZOFRAN) 4 MG tablet   Oral   Take 1 tablet (4 mg total) by mouth every 8 (eight) hours as needed for nausea or vomiting.   15 tablet   0   . oxyCODONE-acetaminophen (ROXICET) 5-325 MG tablet   Oral   Take 1 tablet by mouth every 6 (six) hours as needed for moderate pain.   8 tablet   0   . promethazine (PHENERGAN) 25 MG tablet   Oral   Take 1 tablet (25 mg total) by mouth every 6 (six) hours as needed for nausea or vomiting.   15 tablet   0   . ranitidine (ZANTAC) 150 MG capsule   Oral   Take 1 capsule (150 mg total) by mouth 2 (two) times daily.   28 capsule   0   . tiZANidine (ZANAFLEX) 4 MG tablet   Oral   Take 1 tablet (4 mg total) by mouth 3 (three) times daily.   90 tablet   1     Allergies Mushroom ext cmplx(shiitake-reishi-mait) and Tramadol  No family history on file.  Social History Social History  Substance Use Topics  .  Smoking status: Current Every Day Smoker -- 0.50 packs/day    Types: Cigarettes  . Smokeless tobacco: None  . Alcohol Use: Yes     Comment: occas    Review of Systems Constitutional: No fever/chills.Decreased by mouth intake. Eyes: No visual changes. ENT: No sore throat. No congestion or rhinorrhea. Cardiovascular: Denies chest pain. Denies palpitations. Respiratory: Denies shortness of breath.  No cough. Gastrointestinal: No abdominal pain.  Positive nausea, no vomiting.  No diarrhea.  No constipation. Genitourinary: Negative for dysuria. Musculoskeletal: Negative for back pain. Skin: Negative for rash. Neurological: Positive for headaches. No focal numbness, tingling or weakness.   10-point ROS otherwise negative.  ____________________________________________   PHYSICAL EXAM:  VITAL SIGNS: ED Triage Vitals  Enc Vitals Group     BP 09/07/15 1540 157/71 mmHg     Pulse Rate 09/07/15 1540 98     Resp 09/07/15 1540 20     Temp 09/07/15 1540 97.6 F (36.4 C)     Temp Source 09/07/15 1540 Oral     SpO2 09/07/15 1540 100 %     Weight 09/07/15 1540 125 lb (56.7 kg)     Height 09/07/15 1540 5\' 2"  (1.575 m)     Head Cir --      Peak Flow --      Pain Score 09/07/15 1539 10     Pain Loc --      Pain Edu? --      Excl. in [place]? --     Constitutional: Alert and oriented. Uncomfortable appearing but nontoxic. Answers questions appropriately. Eyes: Conjunctivae are normal.  EOMI. PERRLA. No nystagmus. No scleral icterus. Head: Atraumatic. No raccoon eyes or Battle sign. Nose: No congestion/rhinnorhea. Mouth/Throat: Mucous membranes are dry.  Neck: No stridor.  Supple.  No JVD. Cardiovascular: Normal rate, regular rhythm. Holosystolic murmur without rubs or gallops.  Respiratory: Normal respiratory effort.  No accessory muscle use or retractions. Lungs CTAB.  No wheezes, rales or ronchi. Gastrointestinal: Soft, nontender and nondistended.  No guarding or rebound.  No peritoneal  signs. Musculoskeletal: No LE edema. No ttp in the  calves or palpable cords.  Negative Homan's sign. Neurologic:  A&Ox3.  Speech is clear.  Face and smile are symmetric.  EOMI. PERRLA. Moves all extremities well. Skin:  Skin is warm, dry and intact. No rash noted. Psychiatric: Mood and affect are normal. Speech and behavior are normal.  Normal judgement.  ____________________________________________   LABS (all labs ordered are listed, but only abnormal results are displayed)  Labs Reviewed - No data to display ____________________________________________  EKG  Not indicated ____________________________________________  RADIOLOGY  No results found.  ____________________________________________   PROCEDURES  Procedure(s) performed: None  Critical Care performed: No ____________________________________________   INITIAL IMPRESSION / ASSESSMENT AND PLAN / ED COURSE  Pertinent labs & imaging results that were available during my care of the patient were reviewed by me and considered in my medical decision making (see chart for details).  45 y.o. female with a history of migraines presenting with headache after several days of working without normal by mouth intake. Clinically, she appears mildly dehydrated, and this may be the trigger for her migraine although it has been sometime since her last migraine. She has no clinical symptoms that would be concerning for meningitis, no stiff neck or fever. Intracranial mass or bleed is very unlikely. I will plan to treat her symptomatically and reevaluate the patient.  ----------------------------------------- 5:34 PM on 09/07/2015 -----------------------------------------  At this time, the patient states that her headache has gone from 10 out of 10-0 out of 10. She is completely asymptomatic and was feeling much better. She is able to ambulate and tolerate by mouth. Will plan to discharge the patient home. She understands return  precautions as well as follow-up instructions.  ____________________________________________  FINAL CLINICAL IMPRESSION(S) / ED DIAGNOSES  Final diagnoses:  Other migraine without status migrainosus, not intractable  Dehydration      NEW MEDICATIONS STARTED DURING THIS VISIT:  New Prescriptions   KETOROLAC (TORADOL) 10 MG TABLET    Take 1 tablet (10 mg total) by mouth every 8 (eight) hours as needed for moderate pain (with food).     Eula Listen, MD 09/07/15 1735

## 2015-09-07 NOTE — ED Notes (Signed)
Pt brought in my EMS for a headache, pt reports history of migraines, pt complains of nausea

## 2015-11-14 ENCOUNTER — Emergency Department
Admission: EM | Admit: 2015-11-14 | Discharge: 2015-11-14 | Disposition: A | Discharge status: Home / Self Care | Payer: Self-pay | Attending: Emergency Medicine | Admitting: Emergency Medicine

## 2015-11-14 ENCOUNTER — Encounter: Payer: Self-pay | Admitting: Medical Oncology

## 2015-11-14 DIAGNOSIS — M79644 Pain in right finger(s): Secondary | ICD-10-CM

## 2015-11-14 DIAGNOSIS — M779 Enthesopathy, unspecified: Secondary | ICD-10-CM

## 2015-11-14 DIAGNOSIS — F17210 Nicotine dependence, cigarettes, uncomplicated: Secondary | ICD-10-CM | POA: Insufficient documentation

## 2015-11-14 DIAGNOSIS — I10 Essential (primary) hypertension: Secondary | ICD-10-CM | POA: Insufficient documentation

## 2015-11-14 DIAGNOSIS — G8929 Other chronic pain: Secondary | ICD-10-CM

## 2015-11-14 MED ORDER — MELOXICAM 15 MG PO TABS: 15.0000 mg | ORAL_TABLET | Freq: Every day | ORAL | 0 refills | Status: DC

## 2015-11-14 MED ORDER — KETOROLAC TROMETHAMINE 30 MG/ML IJ SOLN
30.0000 mg | Freq: Once | INTRAMUSCULAR | Status: AC
  Administered 2015-11-14: 30 mg via INTRAMUSCULAR
  Filled 2015-11-14: qty 1

## 2015-11-14 MED ORDER — NAPROXEN 500 MG PO TABS: 500.0000 mg | ORAL_TABLET | Freq: Two times a day (BID) | ORAL | 0 refills | Status: DC

## 2015-11-14 NOTE — Discharge Instructions (Signed)
May continue to use thumb spica splint as needed

## 2015-11-14 NOTE — ED Provider Notes (Signed)
[facility] Emergency Department Provider Note  ____________________________________________  Time seen: Approximately 6:20 PM  I have reviewed the triage vital signs and the nursing notes.   HISTORY  Chief Complaint Hand Pain    HPI Sherry Little is a 45 y.o. female , NAD, presents to the emergency department with chronic right thumb and hand pain. Patient states she was seen at [facility] emergency department approximately one month ago for right thumb pain. States that approximate month ago while moving she present to the right thumb "all the way back". States that she had x-rays completed at the [facility] emergency Department in which she was told that her tendons were torn. She was referred to an orthopedic physician at [facility] but states since she lives in [place] she would prefer to see someone here. Patient notes that [facility] is currently working on a local referral. Patient states pain about the right thumb increased last night while she was working. 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 Patient has been using a thumb splint which did not help her pain last night. Has been taking ibuprofen which recently has not been helping. Denies any additional injuries or traumas to the right thumb or hand. Has not noted any redness, swelling or warmth to the area. No skin sores or open wounds. Denies numbness, weakness, tingling. It is noted that the patient has been seen in this emergency department twice earlier this year for the same complaint and has not followed up with orthopedics after those evaluations as well.   Past Medical History:  Diagnosis Date  . Heart murmur   . Hypertension   . VSD (ventricular septal defect and aortic arch hypoplasia     There are no active problems to display for this patient.   Past Surgical History:  Procedure Laterality Date  . GANGLION CYST EXCISION  2008   left wrist  . HAND SURGERY     cyst romved from left thumb  . TUBAL LIGATION      Prior to Admission medications   Medication Sig Start Date End Date Taking? Authorizing Provider  albuterol (PROVENTIL HFA;VENTOLIN HFA) 108 (90 Base) MCG/ACT inhaler Inhale 2 puffs into the lungs every 6 (six) hours as needed for wheezing or shortness of breath. 05/22/15   Orbie Pyo, MD  ALPRAZolam Duanne Moron) 0.5 MG tablet Take 1 tablet (0.5 mg total) by mouth at bedtime as needed for anxiety. 10/20/13   Cleatrice Burke, PA-C  ALPRAZolam Duanne Moron) 1 MG tablet Take 1 mg by mouth 3 (three) times daily.    Historical Provider, MD  diphenhydrAMINE (BENADRYL) 25 MG tablet Take 1 tablet (25 mg total) by mouth every 6 (six) hours as needed for itching (Rash). 10/20/13   Cleatrice Burke, PA-C  famotidine (PEPCID) 20 MG tablet Take 1 tablet (20 mg total) by mouth 2 (two) times daily. 10/20/13   Cleatrice Burke, PA-C  guaiFENesin (ROBITUSSIN) 100 MG/5ML SOLN Take 5 mLs (100 mg total) by mouth every 4 (four) hours as needed for cough or to loosen phlegm. 03/21/15   Carrie Mew, MD  ketorolac (TORADOL) 10 MG tablet Take 1 tablet (10 mg total) by mouth every 8 (eight) hours as needed for moderate pain (with food). 09/07/15   Eula Listen, MD  loperamide (IMODIUM A-D) 2 MG tablet Take 2 tablets (4 mg total) by mouth 4 (four) times daily as needed for diarrhea or loose stools. 03/21/15   Carrie Mew,  MD  meloxicam (MOBIC) 15 MG tablet Take 1 tablet (15 mg total) by mouth daily. 11/14/15   Idelia Caudell L Aila Terra, PA-C  mirtazapine (REMERON) 30 MG tablet Take 30 mg by mouth at bedtime.    Historical Provider, MD  naproxen (NAPROSYN) 500 MG tablet Take 1 tablet (500 mg total) by mouth 2 (two) times daily with a meal. 06/20/15   Lemonte Al L Cecilio Ohlrich, PA-C  OLANZapine (ZYPREXA) 20 MG tablet Take 20 mg by mouth at bedtime.    Historical Provider, MD  ondansetron (ZOFRAN) 4 MG tablet Take 1 tablet (4 mg total) by mouth every 8 (eight) hours as needed for nausea or  vomiting. 09/07/15   Eula Listen, MD  oxyCODONE-acetaminophen (ROXICET) 5-325 MG tablet Take 1 tablet by mouth every 6 (six) hours as needed for moderate pain. 06/29/15   Sable Feil, PA-C  promethazine (PHENERGAN) 25 MG tablet Take 1 tablet (25 mg total) by mouth every 6 (six) hours as needed for nausea or vomiting. 03/21/15   Carrie Mew, MD  ranitidine (ZANTAC) 150 MG capsule Take 1 capsule (150 mg total) by mouth 2 (two) times daily. 03/21/15   Carrie Mew, MD    Allergies Mushroom ext cmplx(shiitake-reishi-mait) and Tramadol  No family history on file.  Social History Social History  Substance Use Topics  . Smoking status: Current Every Day Smoker    Packs/day: 0.50    Types: Cigarettes  . Smokeless tobacco: Not on file  . Alcohol use Yes     Comment: occas     Review of Systems  Constitutional: No fever/chills Cardiovascular: No chest pain. Respiratory:  No shortness of breath.  Musculoskeletal: Positive right thumb and hand pain. Skin: Negative for rash, redness, swelling, skin sores. Neurological: Negative for headaches, focal weakness or numbness. No tingling. 10-point ROS otherwise negative.  ____________________________________________   PHYSICAL EXAM:  VITAL SIGNS: ED Triage Vitals  Enc Vitals Group     BP 11/14/15 1755 (!) 155/74     Pulse Rate 11/14/15 1755 62     Resp 11/14/15 1755 18     Temp 11/14/15 1755 98.3 F (36.8 C)     Temp Source 11/14/15 1755 Oral     SpO2 11/14/15 1755 99 %     Weight 11/14/15 1755 130 lb (59 kg)     Height 11/14/15 1755 5\' 2"  (1.575 m)     Head Circumference --      Peak Flow --      Pain Score 11/14/15 1753 8     Pain Loc --      Pain Edu? --      Excl. in [place]? --      Constitutional: Alert and oriented. Well appearing and in no acute distress. Eyes: Conjunctivae are normal.  Head: Atraumatic. Cardiovascular: Good peripheral circulation with 2+ pulses noted in the right upper extremity.  Capillary refill is brisk in all digits of the right hand. Respiratory: Normal respiratory effort without tachypnea or retractions.  Musculoskeletal: Tenderness to palpation diffusely about the base of the right thumb and intertriginous area. Decreased range of motion of the right thumb due to pain. No crepitus or laxity noted to manipulation of the joints of the right thumb. Full range of motion of the right wrist without pain. No snuffbox tenderness. Neurologic:  Normal speech and language. No gross focal neurologic deficits are appreciated. Sensation to light touch grossly intact about the right upper extremity. Skin:  Skin is warm, dry and intact. No rash, redness, swelling,  skin sores noted. Psychiatric: Mood and affect are normal. Speech and behavior are normal. Patient exhibits appropriate insight and judgement.   ____________________________________________   LABS  None ____________________________________________  EKG  None ____________________________________________  RADIOLOGY  None  Imaging completed in June 2017 at [facility] noted no fractures or dislocations about the right hand or thumb. I do not feel additional imaging is necessary at this time as patient has no new injuries or traumas to the area. ____________________________________________    PROCEDURES  Procedure(s) performed: None   Procedures   Medications  ketorolac (TORADOL) 30 MG/ML injection 30 mg (30 mg Intramuscular Given 11/14/15 1833)     ____________________________________________   INITIAL IMPRESSION / ASSESSMENT AND PLAN / ED COURSE  Pertinent labs & imaging results that were available during my care of the patient were reviewed by me and considered in my medical decision making (see chart for details).  Clinical Course    Patient's diagnosis is consistent with chronic right thumb pain and tendinitis of the right thumb. Patient will be discharged home with prescriptions for  naproxen to take as directed. Patient is to continue use of ibuprofen and may use Tylenol only as needed for pain. Patient is advised to use thumb spica splint as needed to decrease pain and protect the thumb while working. Patient also advised to follow-up with Dr. Rudene Christians in orthopedics and is educated that there is no further management that can be completed in the emergency department and that specialty follow-up is necessary for improvement of her pain. Patient is also advised that she may follow with her primary care provider at [facility] for any interim care. Patient is given ED precautions to return to the ED for any worsening or new symptoms.    ____________________________________________  FINAL CLINICAL IMPRESSION(S) / ED DIAGNOSES  Final diagnoses:  Chronic pain of right thumb  Tendinitis      NEW MEDICATIONS STARTED DURING THIS VISIT:  New Prescriptions   MELOXICAM (MOBIC) 15 MG TABLET    Take 1 tablet (15 mg total) by mouth daily.         Braxton Feathers, PA-C 11/14/15 1859   ----------------------------------------- 7:01 PM on 11/14/2015 -----------------------------------------  Patient states that Motrin causes a rash and requests prescription for Naprosyn or ibuprofen. We'll give the patient a seven-day prescription of Naprosyn and again encouraged her to follow up with orthopedics as recommended.    Braxton Feathers, PA-C 11/14/15 1902    Carrie Mew, MD 11/15/15 [phone]

## 2015-11-14 NOTE — ED Triage Notes (Signed)
Pt reports she has been having pain to her rt hand but today at work it worsened.

## 2015-12-28 ENCOUNTER — Encounter: Payer: Self-pay | Admitting: Emergency Medicine

## 2015-12-28 ENCOUNTER — Emergency Department: Payer: Self-pay

## 2015-12-28 ENCOUNTER — Emergency Department
Admission: EM | Admit: 2015-12-28 | Discharge: 2015-12-28 | Disposition: A | Discharge status: Home / Self Care | Payer: Self-pay | Attending: Emergency Medicine | Admitting: Emergency Medicine

## 2015-12-28 DIAGNOSIS — I10 Essential (primary) hypertension: Secondary | ICD-10-CM | POA: Insufficient documentation

## 2015-12-28 DIAGNOSIS — F17210 Nicotine dependence, cigarettes, uncomplicated: Secondary | ICD-10-CM | POA: Insufficient documentation

## 2015-12-28 DIAGNOSIS — J069 Acute upper respiratory infection, unspecified: Secondary | ICD-10-CM | POA: Insufficient documentation

## 2015-12-28 DIAGNOSIS — B9789 Other viral agents as the cause of diseases classified elsewhere: Secondary | ICD-10-CM

## 2015-12-28 DIAGNOSIS — Z79899 Other long term (current) drug therapy: Secondary | ICD-10-CM | POA: Insufficient documentation

## 2015-12-28 MED ORDER — ACETAMINOPHEN 500 MG PO TABS
1000.0000 mg | ORAL_TABLET | Freq: Once | ORAL | Status: AC
  Administered 2015-12-28: 1000 mg via ORAL

## 2015-12-28 MED ORDER — DEXAMETHASONE SODIUM PHOSPHATE 10 MG/ML IJ SOLN
10.0000 mg | Freq: Once | INTRAMUSCULAR | Status: AC
  Administered 2015-12-28: 10 mg via INTRAMUSCULAR
  Filled 2015-12-28: qty 1

## 2015-12-28 MED ORDER — ACETAMINOPHEN 500 MG PO TABS
ORAL_TABLET | ORAL | Status: AC
  Administered 2015-12-28: 1000 mg via ORAL
  Filled 2015-12-28: qty 2

## 2015-12-28 MED ORDER — IPRATROPIUM-ALBUTEROL 0.5-2.5 (3) MG/3ML IN SOLN
3.0000 mL | Freq: Once | RESPIRATORY_TRACT | Status: AC
  Administered 2015-12-28: 3 mL via RESPIRATORY_TRACT
  Filled 2015-12-28: qty 3

## 2015-12-28 MED ORDER — ACETAMINOPHEN-CODEINE #3 300-30 MG PO TABS: 1.0000 | ORAL_TABLET | Freq: Three times a day (TID) | ORAL | 0 refills | Status: DC | PRN

## 2015-12-28 MED ORDER — FLUTICASONE PROPIONATE 50 MCG/ACT NA SUSP: 2.0000 | Freq: Every day | NASAL | 0 refills | Status: DC

## 2015-12-28 MED ORDER — ALBUTEROL SULFATE HFA 108 (90 BASE) MCG/ACT IN AERS: 2.0000 | INHALATION_SPRAY | Freq: Four times a day (QID) | RESPIRATORY_TRACT | 0 refills | Status: DC | PRN

## 2015-12-28 MED ORDER — BENZONATATE 100 MG PO CAPS: 100.0000 mg | ORAL_CAPSULE | Freq: Three times a day (TID) | ORAL | 0 refills | Status: DC | PRN

## 2015-12-28 NOTE — ED Triage Notes (Signed)
Pt comes into the ED via POv c/o cough that started last night.  Cough is unproductive and patient tried OTC nyquil last night with minimal relief.  Denies any h/o asthma or COPD.  States it has caused some mild shortness of breath, but patient is in NAD with even and unlabored respirations.

## 2015-12-28 NOTE — ED Provider Notes (Signed)
[facility] Emergency Department Provider Note ____________________________________________  Time seen: 1400  I have reviewed the triage vital signs and the nursing notes.  HISTORY  Chief Complaint  Cough  HPI Sherry Little is a 45 y.o. female presents to the ED for evaluation of a 3 to four-day complaint of cough with worsening of symptoms yesterday. She denies any interim fevers, chills, or sweats patient does note body aches as well as fatigue. She denies any bloody or purulence sputum. She is dosed NyQuil for symptom relief since last night. She denies any history of asthma bronchitis.  Past Medical History:  Diagnosis Date  . Heart murmur   . Hypertension   . VSD (ventricular septal defect and aortic arch hypoplasia     There are no active problems to display for this patient.   Past Surgical History:  Procedure Laterality Date  . GANGLION CYST EXCISION  2008   left wrist  . HAND SURGERY     cyst romved from left thumb  . TUBAL LIGATION      Prior to Admission medications   Medication Sig Start Date End Date Taking? Authorizing Provider  acetaminophen-codeine (TYLENOL #3) 300-30 MG tablet Take 1 tablet by mouth every 8 (eight) hours as needed for moderate pain. 12/28/15   Jaydyn Menon V Bacon Kalysta Kneisley, PA-C  albuterol (PROVENTIL HFA;VENTOLIN HFA) 108 (90 Base) MCG/ACT inhaler Inhale 2 puffs into the lungs every 6 (six) hours as needed for wheezing or shortness of breath. 05/22/15   Orbie Pyo, MD  albuterol (PROVENTIL HFA;VENTOLIN HFA) 108 [phone] Base) MCG/ACT inhaler Inhale 2 puffs into the lungs every 6 (six) hours as needed for wheezing or shortness of breath. 12/28/15   Abhijay Morriss V Bacon Issiah Huffaker, PA-C  ALPRAZolam (XANAX) 0.5 MG tablet Take 1 tablet (0.5 mg total) by mouth at bedtime as needed for anxiety. 10/20/13   Cleatrice Burke, PA-C  ALPRAZolam Duanne Moron) 1 MG tablet Take 1 mg by mouth 3 (three) times daily.    Historical Provider, MD  benzonatate  (TESSALON PERLES) 100 MG capsule Take 1 capsule (100 mg total) by mouth 3 (three) times daily as needed for cough (Take 1-2 per dose). 12/28/15   Jadine Brumley V Bacon Bhavana Kady, PA-C  diphenhydrAMINE (BENADRYL) 25 MG tablet Take 1 tablet (25 mg total) by mouth every 6 (six) hours as needed for itching (Rash). 10/20/13   Cleatrice Burke, PA-C  famotidine (PEPCID) 20 MG tablet Take 1 tablet (20 mg total) by mouth 2 (two) times daily. 10/20/13   Cleatrice Burke, PA-C  fluticasone (FLONASE) 50 MCG/ACT nasal spray Place 2 sprays into both nostrils daily. 12/28/15   Cornelius Marullo V Bacon Abb Gobert, PA-C  guaiFENesin (ROBITUSSIN) 100 MG/5ML SOLN Take 5 mLs (100 mg total) by mouth every 4 (four) hours as needed for cough or to loosen phlegm. 03/21/15   Carrie Mew, MD  ketorolac (TORADOL) 10 MG tablet Take 1 tablet (10 mg total) by mouth every 8 (eight) hours as needed for moderate pain (with food). 09/07/15   Eula Listen, MD  loperamide (IMODIUM A-D) 2 MG tablet Take 2 tablets (4 mg total) by mouth 4 (four) times daily as needed for diarrhea or loose stools. 03/21/15   Carrie Mew, MD  mirtazapine (REMERON) 30 MG tablet Take 30 mg by mouth at bedtime.    Historical Provider, MD  naproxen (NAPROSYN) 500 MG tablet Take 1 tablet (500 mg total) by mouth 2 (two) times daily with a meal. 11/14/15   Jami L Hagler, PA-C  OLANZapine (ZYPREXA) 20 MG tablet Take 20 mg by mouth at bedtime.    Historical Provider, MD  ondansetron (ZOFRAN) 4 MG tablet Take 1 tablet (4 mg total) by mouth every 8 (eight) hours as needed for nausea or vomiting. 09/07/15   Eula Listen, MD  oxyCODONE-acetaminophen (ROXICET) 5-325 MG tablet Take 1 tablet by mouth every 6 (six) hours as needed for moderate pain. 06/29/15   Sable Feil, PA-C  promethazine (PHENERGAN) 25 MG tablet Take 1 tablet (25 mg total) by mouth every 6 (six) hours as needed for nausea or vomiting. 03/21/15   Carrie Mew, MD  ranitidine (ZANTAC) 150 MG capsule Take 1  capsule (150 mg total) by mouth 2 (two) times daily. 03/21/15   Carrie Mew, MD    Allergies Mushroom ext cmplx(shiitake-reishi-mait); Mobic [meloxicam]; and Tramadol  No family history on file.  Social History Social History  Substance Use Topics  . Smoking status: Current Every Day Smoker    Packs/day: 0.50    Types: Cigarettes  . Smokeless tobacco: Never Used  . Alcohol use Yes     Comment: occas    Review of Systems  Constitutional: Negative for fever. Eyes: Negative for visual changes. ENT: Negative for sore throat. Cardiovascular: Negative for chest pain. Respiratory: Positive for shortness of breath. Reports nonproductive cough as above. Gastrointestinal: Negative for abdominal pain, vomiting and diarrhea. ____________________________________________  PHYSICAL EXAM:  VITAL SIGNS: ED Triage Vitals  Enc Vitals Group     BP --      Pulse Rate 12/28/15 1203 79     Resp 12/28/15 1203 20     Temp 12/28/15 1203 98.7 F (37.1 C)     Temp Source 12/28/15 1203 Oral     SpO2 12/28/15 1203 98 %     Weight 12/28/15 1204 130 lb (59 kg)     Height 12/28/15 1204 5\' 2"  (1.575 m)     Head Circumference --      Peak Flow --      Pain Score 12/28/15 1204 8     Pain Loc --      Pain Edu? --      Excl. in [place]? --    Constitutional: Alert and oriented. Well appearing and in no distress. Head: Normocephalic and atraumatic.      Eyes: Conjunctivae are normal. PERRL. Normal extraocular movements      Ears: Canals clear. TMs intact bilaterally.   Nose: No congestion/rhinorrhea.   Mouth/Throat: Mucous membranes are moist.   Neck: Supple. No thyromegaly. Hematological/Lymphatic/Immunological: No cervical lymphadenopathy. Cardiovascular: Normal rate, regular rhythm.  Respiratory: Normal respiratory effort. No wheezes/rales/rhonchi. Gastrointestinal: Soft and nontender. No distention. Musculoskeletal: Nontender with normal range of motion in all extremities.   ____________________________________________   RADIOLOGY  CXR IMPRESSION: No acute cardiopulmonary disease. ____________________________________________  PROCEDURES  DuoNeb x 1 Decadron 10 mg IM ____________________________________________  INITIAL IMPRESSION / ASSESSMENT AND PLAN / ED COURSE  Patient what appears to be a viral bronchospasm without indication cardiopulmonary process. She'll be discharged with a prescription for Tessalon Perles, albuterol inhaler, and Flonase. She will follow-up with [facility] for ongoing symptom management.  Clinical Course   ____________________________________________  FINAL CLINICAL IMPRESSION(S) / ED DIAGNOSES  Final diagnoses:  Viral URI with cough      Melvenia Needles, PA-C 12/28/15 1503    Lavonia Drafts, MD 12/28/15 1537

## 2015-12-28 NOTE — ED Notes (Addendum)
Pt in via triage with complaints of worsening cough, congestion, and chest tightness over the last three days.  Pt A/Ox4, tachypneic upon assessment w/ continuous cough.  Pt denies taking anything for cough.  Pt in no immediate distress at this time.

## 2015-12-28 NOTE — Discharge Instructions (Signed)
You're exam and chest x-ray appeared to show a viral upper respiratory infection with a cough. She dose the  prescribed  medications as directed. Follow-up with your provider at [facility] for ongoing symptoms.

## 2015-12-28 NOTE — ED Notes (Signed)
Pt. Verbalizes understanding of d/c instructions, prescriptions, and follow-up. VS stable and pt denies pain.  Pt. In NAD at time of d/c and denies further concerns regarding this visit. Pt. Stable at the time of departure from the unit, departing unit by the safest and most appropriate manner per that pt condition and limitations. Pt advised to return to the ED at any time for emergent concerns, or for new/worsening symptoms.   

## 2016-01-12 ENCOUNTER — Encounter: Payer: Self-pay | Admitting: Emergency Medicine

## 2016-01-12 ENCOUNTER — Emergency Department
Admission: EM | Admit: 2016-01-12 | Discharge: 2016-01-12 | Disposition: A | Discharge status: Home / Self Care | Payer: Self-pay | Attending: Emergency Medicine | Admitting: Emergency Medicine

## 2016-01-12 DIAGNOSIS — F17210 Nicotine dependence, cigarettes, uncomplicated: Secondary | ICD-10-CM | POA: Insufficient documentation

## 2016-01-12 DIAGNOSIS — J0100 Acute maxillary sinusitis, unspecified: Secondary | ICD-10-CM | POA: Insufficient documentation

## 2016-01-12 DIAGNOSIS — R0981 Nasal congestion: Secondary | ICD-10-CM

## 2016-01-12 DIAGNOSIS — I10 Essential (primary) hypertension: Secondary | ICD-10-CM | POA: Insufficient documentation

## 2016-01-12 MED ORDER — BUTALBITAL-APAP-CAFFEINE 50-325-40 MG PO TABS
2.0000 | ORAL_TABLET | Freq: Once | ORAL | Status: AC
  Administered 2016-01-12: 2 via ORAL
  Filled 2016-01-12: qty 2

## 2016-01-12 MED ORDER — AMOXICILLIN-POT CLAVULANATE 875-125 MG PO TABS: 1.0000 | ORAL_TABLET | Freq: Two times a day (BID) | ORAL | 0 refills | Status: AC

## 2016-01-12 MED ORDER — AMOXICILLIN-POT CLAVULANATE 875-125 MG PO TABS
1.0000 | ORAL_TABLET | Freq: Once | ORAL | Status: AC
  Administered 2016-01-12: 1 via ORAL
  Filled 2016-01-12: qty 1

## 2016-01-12 MED ORDER — BUTALBITAL-APAP-CAFFEINE 50-325-40 MG PO TABS: 1.0000 | ORAL_TABLET | Freq: Four times a day (QID) | ORAL | 0 refills | Status: DC | PRN

## 2016-01-12 NOTE — ED Provider Notes (Signed)
[facility] Emergency Department Provider Note  Time seen: 5:11 AM  I have reviewed the triage vital signs and the nursing notes.   HISTORY  Chief Complaint Nasal Congestion    HPI Sherry Little is a 45 y.o. female who presents to the emergency department with continued nasal congestion and headache. According to the patient for the past 1-2 weeks she has been having significant nasal congestion and sinus pain/pressure. She has used over the counter medications without relief. Patient states she has a headache for the past 2 or 3 days which she relates to her sinus pressure/pain. Denies any fever, focal weakness or numbness, neck pain or stiffness. States the headache started mild and progressively worsened over the last 3 days. Patient also has a history of migraines, but states this feels like the headache is coming from her sinuses.  Past Medical History:  Diagnosis Date  . Heart murmur   . Hypertension   . VSD (ventricular septal defect and aortic arch hypoplasia     There are no active problems to display for this patient.   Past Surgical History:  Procedure Laterality Date  . GANGLION CYST EXCISION  2008   left wrist  . HAND SURGERY     cyst romved from left thumb  . TUBAL LIGATION      Prior to Admission medications   Medication Sig Start Date End Date Taking? Authorizing Provider  acetaminophen-codeine (TYLENOL #3) 300-30 MG tablet Take 1 tablet by mouth every 8 (eight) hours as needed for moderate pain. 12/28/15   Jenise V Bacon Menshew, PA-C  albuterol (PROVENTIL HFA;VENTOLIN HFA) 108 (90 Base) MCG/ACT inhaler Inhale 2 puffs into the lungs every 6 (six) hours as needed for wheezing or shortness of breath. 05/22/15   Orbie Pyo, MD  albuterol (PROVENTIL HFA;VENTOLIN HFA) 108 [phone] Base) MCG/ACT inhaler Inhale 2 puffs into the lungs every 6 (six) hours as needed for wheezing or shortness of breath. 12/28/15   Jenise V Bacon Menshew, PA-C   ALPRAZolam (XANAX) 0.5 MG tablet Take 1 tablet (0.5 mg total) by mouth at bedtime as needed for anxiety. 10/20/13   Cleatrice Burke, PA-C  ALPRAZolam Duanne Moron) 1 MG tablet Take 1 mg by mouth 3 (three) times daily.    Historical Provider, MD  benzonatate (TESSALON PERLES) 100 MG capsule Take 1 capsule (100 mg total) by mouth 3 (three) times daily as needed for cough (Take 1-2 per dose). 12/28/15   Jenise V Bacon Menshew, PA-C  diphenhydrAMINE (BENADRYL) 25 MG tablet Take 1 tablet (25 mg total) by mouth every 6 (six) hours as needed for itching (Rash). 10/20/13   Cleatrice Burke, PA-C  famotidine (PEPCID) 20 MG tablet Take 1 tablet (20 mg total) by mouth 2 (two) times daily. 10/20/13   Cleatrice Burke, PA-C  fluticasone (FLONASE) 50 MCG/ACT nasal spray Place 2 sprays into both nostrils daily. 12/28/15   Jenise V Bacon Menshew, PA-C  guaiFENesin (ROBITUSSIN) 100 MG/5ML SOLN Take 5 mLs (100 mg total) by mouth every 4 (four) hours as needed for cough or to loosen phlegm. 03/21/15   Carrie Mew, MD  ketorolac (TORADOL) 10 MG tablet Take 1 tablet (10 mg total) by mouth every 8 (eight) hours as needed for moderate pain (with food). 09/07/15   Eula Listen, MD  loperamide (IMODIUM A-D) 2 MG tablet Take 2 tablets (4 mg total) by mouth 4 (four) times daily as needed for diarrhea or loose stools. 03/21/15   Carrie Mew, MD  mirtazapine (  REMERON) 30 MG tablet Take 30 mg by mouth at bedtime.    Historical Provider, MD  naproxen (NAPROSYN) 500 MG tablet Take 1 tablet (500 mg total) by mouth 2 (two) times daily with a meal. 11/14/15   Jami L Hagler, PA-C  OLANZapine (ZYPREXA) 20 MG tablet Take 20 mg by mouth at bedtime.    Historical Provider, MD  ondansetron (ZOFRAN) 4 MG tablet Take 1 tablet (4 mg total) by mouth every 8 (eight) hours as needed for nausea or vomiting. 09/07/15   Eula Listen, MD  oxyCODONE-acetaminophen (ROXICET) 5-325 MG tablet Take 1 tablet by mouth every 6 (six) hours as needed for  moderate pain. 06/29/15   Sable Feil, PA-C  promethazine (PHENERGAN) 25 MG tablet Take 1 tablet (25 mg total) by mouth every 6 (six) hours as needed for nausea or vomiting. 03/21/15   Carrie Mew, MD  ranitidine (ZANTAC) 150 MG capsule Take 1 capsule (150 mg total) by mouth 2 (two) times daily. 03/21/15   Carrie Mew, MD    Allergies  Allergen Reactions  . Mushroom Ext Cmplx(Shiitake-Reishi-Mait) Itching and Swelling    All mushrooms   . Mobic [Meloxicam] Rash  . Tramadol Rash    History reviewed. No pertinent family history.  Social History Social History  Substance Use Topics  . Smoking status: Current Every Day Smoker    Packs/day: 0.50    Types: Cigarettes  . Smokeless tobacco: Never Used  . Alcohol use Yes     Comment: occas    Review of Systems Constitutional: Negative for fever. ENT: Positive for nasal congestion and sinus pressure. Cardiovascular: Negative for chest pain. Respiratory: Negative for shortness of breath. Gastrointestinal: Negative for abdominal pain Neurological: Moderate headache, denies focal weakness or numbness. 10-point ROS otherwise negative.  ____________________________________________   PHYSICAL EXAM:  VITAL SIGNS: ED Triage Vitals  Enc Vitals Group     BP 01/12/16 0501 (!) 144/74     Pulse Rate 01/12/16 0501 61     Resp 01/12/16 0501 18     Temp 01/12/16 0501 98.2 F (36.8 C)     Temp Source 01/12/16 0501 Oral     SpO2 01/12/16 0458 100 %     Weight 01/12/16 0501 135 lb (61.2 kg)     Height 01/12/16 0501 5\' 2"  (1.575 m)     Head Circumference --      Peak Flow --      Pain Score 01/12/16 0502 10     Pain Loc --      Pain Edu? --      Excl. in [place]? --     Constitutional: Alert and oriented. Well appearing and in no distress. Eyes: Normal exam ENT   Head: Normocephalic and atraumatic.   Nose: Mild congestion. Mild sinus tenderness to percussion, maxillary sinuses bilaterally.   Mouth/Throat: Mucous  membranes are moist. Cardiovascular: Normal rate, regular rhythm. No murmur Respiratory: Normal respiratory effort without tachypnea nor retractions. Breath sounds are clear  Gastrointestinal: Soft and nontender. No distention.   Musculoskeletal: Nontender with normal range of motion in all extremities Neurologic:  Normal speech and language. No gross focal neurologic deficits Skin:  Skin is warm, dry and intact.  Psychiatric: Mood and affect are normal.   ____________________________________________    INITIAL IMPRESSION / ASSESSMENT AND PLAN / ED COURSE  Pertinent labs & imaging results that were available during my care of the patient were reviewed by me and considered in my medical decision making (see chart for  details).  The patient presents to the emergency department with sinus pain/pressure and a headache. Patient does have nasal congestion on exam, suspect likely sinusitis. Otherwise she has a normal physical exam. Equal grip strengths, intact neurological exam. Denies fever, afebrile in the emergency department. Denies neck pain or stiffness. States progressive onset of the headache. Given the symptoms of sinus pressure have been ongoing for greater than one week we'll place the patient on antibiotics. I will also write the patient for Fioricet to help with her headache symptoms. I discussed with the patient and need for her to follow up with a primary care doctor, the patient is agreeable.  ____________________________________________   FINAL CLINICAL IMPRESSION(S) / ED DIAGNOSES  Sinusitis Headache    Harvest Dark, MD 01/12/16 [phone]

## 2016-01-12 NOTE — ED Notes (Signed)
Pt discharged to home.  Family member driving.  Discharge instructions reviewed.  Verbalized understanding.  No questions or concerns at this time.  Teach back verified.  Pt in NAD.  No items left in ED.   

## 2016-01-12 NOTE — ED Triage Notes (Signed)
Pt arrived to the ED accompanied by family for complaints of headache, congestion and generalized body aches. Pt is AOx4 in no apparent distress.

## 2016-02-11 ENCOUNTER — Encounter: Payer: Self-pay | Admitting: Emergency Medicine

## 2016-02-11 ENCOUNTER — Emergency Department
Admission: EM | Admit: 2016-02-11 | Discharge: 2016-02-11 | Disposition: A | Discharge status: Home / Self Care | Payer: Self-pay | Attending: Emergency Medicine | Admitting: Emergency Medicine

## 2016-02-11 DIAGNOSIS — Z79899 Other long term (current) drug therapy: Secondary | ICD-10-CM | POA: Insufficient documentation

## 2016-02-11 DIAGNOSIS — S39012A Strain of muscle, fascia and tendon of lower back, initial encounter: Secondary | ICD-10-CM | POA: Insufficient documentation

## 2016-02-11 DIAGNOSIS — Y990 Civilian activity done for income or pay: Secondary | ICD-10-CM | POA: Insufficient documentation

## 2016-02-11 DIAGNOSIS — Y9389 Activity, other specified: Secondary | ICD-10-CM | POA: Insufficient documentation

## 2016-02-11 DIAGNOSIS — F17210 Nicotine dependence, cigarettes, uncomplicated: Secondary | ICD-10-CM | POA: Insufficient documentation

## 2016-02-11 DIAGNOSIS — F439 Reaction to severe stress, unspecified: Secondary | ICD-10-CM

## 2016-02-11 DIAGNOSIS — Y929 Unspecified place or not applicable: Secondary | ICD-10-CM | POA: Insufficient documentation

## 2016-02-11 DIAGNOSIS — I10 Essential (primary) hypertension: Secondary | ICD-10-CM | POA: Insufficient documentation

## 2016-02-11 DIAGNOSIS — F419 Anxiety disorder, unspecified: Secondary | ICD-10-CM | POA: Insufficient documentation

## 2016-02-11 DIAGNOSIS — X503XXA Overexertion from repetitive movements, initial encounter: Secondary | ICD-10-CM | POA: Insufficient documentation

## 2016-02-11 MED ORDER — DIAZEPAM 2 MG PO TABS
2.0000 mg | ORAL_TABLET | Freq: Once | ORAL | Status: AC
  Administered 2016-02-11: 2 mg via ORAL
  Filled 2016-02-11: qty 1

## 2016-02-11 MED ORDER — DIAZEPAM 2 MG PO TABS: 2.0000 mg | ORAL_TABLET | Freq: Three times a day (TID) | ORAL | 0 refills | Status: DC | PRN

## 2016-02-11 NOTE — Discharge Instructions (Signed)
1. You may take muscle relaxer as needed (Valium). 2. Apply moist heat to affected area several times daily. 3. Please call [facility] to schedule an appointment to have your psychiatric medications refilled. 4. Return to the ER for worsening symptoms, persistent vomiting, difficulty breathing or other concerns.

## 2016-02-11 NOTE — ED Notes (Signed)
Patient with complaint of lower back pain times 2 days. Patient states that the pain became worse tonight. Patient denies any injury.

## 2016-02-11 NOTE — ED Triage Notes (Addendum)
Pt c/o low back pain, non radiating, for 3 days; pt says she does repetitive motions at work; no one defining moment of injury; pt was taking percocet for pain but doesn't have any more; denies incontinence of bowel or bladder; denies tingling; pt adds she is feeling some anxiety; has been under some stress; pt has been prescribed Xanax for anxiety but is currently out of that medication (as well as her Zyprexa and Remeron)
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
## 2016-03-17 ENCOUNTER — Encounter: Payer: Self-pay | Admitting: Emergency Medicine

## 2016-03-17 ENCOUNTER — Inpatient Hospital Stay
Admission: EM | Admit: 2016-03-17 | Discharge: 2016-03-19 | DRG: 811 | Disposition: A | Discharge status: Home / Self Care | Payer: Self-pay | Attending: Internal Medicine | Admitting: Internal Medicine

## 2016-03-17 ENCOUNTER — Emergency Department: Payer: Self-pay

## 2016-03-17 DIAGNOSIS — K2970 Gastritis, unspecified, without bleeding: Secondary | ICD-10-CM | POA: Diagnosis present

## 2016-03-17 DIAGNOSIS — Z886 Allergy status to analgesic agent status: Secondary | ICD-10-CM

## 2016-03-17 DIAGNOSIS — F329 Major depressive disorder, single episode, unspecified: Secondary | ICD-10-CM | POA: Diagnosis present

## 2016-03-17 DIAGNOSIS — R634 Abnormal weight loss: Secondary | ICD-10-CM

## 2016-03-17 DIAGNOSIS — E43 Unspecified severe protein-calorie malnutrition: Secondary | ICD-10-CM | POA: Diagnosis present

## 2016-03-17 DIAGNOSIS — D500 Iron deficiency anemia secondary to blood loss (chronic): Principal | ICD-10-CM | POA: Diagnosis present

## 2016-03-17 DIAGNOSIS — R109 Unspecified abdominal pain: Secondary | ICD-10-CM | POA: Diagnosis present

## 2016-03-17 DIAGNOSIS — I10 Essential (primary) hypertension: Secondary | ICD-10-CM | POA: Diagnosis present

## 2016-03-17 DIAGNOSIS — R0609 Other forms of dyspnea: Secondary | ICD-10-CM

## 2016-03-17 DIAGNOSIS — R1013 Epigastric pain: Secondary | ICD-10-CM

## 2016-03-17 DIAGNOSIS — Z91030 Bee allergy status: Secondary | ICD-10-CM

## 2016-03-17 DIAGNOSIS — D125 Benign neoplasm of sigmoid colon: Secondary | ICD-10-CM | POA: Diagnosis present

## 2016-03-17 DIAGNOSIS — K259 Gastric ulcer, unspecified as acute or chronic, without hemorrhage or perforation: Secondary | ICD-10-CM | POA: Diagnosis present

## 2016-03-17 DIAGNOSIS — I8510 Secondary esophageal varices without bleeding: Secondary | ICD-10-CM | POA: Diagnosis present

## 2016-03-17 DIAGNOSIS — D259 Leiomyoma of uterus, unspecified: Secondary | ICD-10-CM | POA: Diagnosis present

## 2016-03-17 DIAGNOSIS — I38 Endocarditis, valve unspecified: Secondary | ICD-10-CM

## 2016-03-17 DIAGNOSIS — K3189 Other diseases of stomach and duodenum: Secondary | ICD-10-CM

## 2016-03-17 DIAGNOSIS — F17210 Nicotine dependence, cigarettes, uncomplicated: Secondary | ICD-10-CM | POA: Diagnosis present

## 2016-03-17 DIAGNOSIS — R001 Bradycardia, unspecified: Secondary | ICD-10-CM | POA: Diagnosis present

## 2016-03-17 DIAGNOSIS — Z6821 Body mass index (BMI) 21.0-21.9, adult: Secondary | ICD-10-CM

## 2016-03-17 DIAGNOSIS — N920 Excessive and frequent menstruation with regular cycle: Secondary | ICD-10-CM | POA: Diagnosis present

## 2016-03-17 DIAGNOSIS — Z79899 Other long term (current) drug therapy: Secondary | ICD-10-CM

## 2016-03-17 DIAGNOSIS — K635 Polyp of colon: Secondary | ICD-10-CM

## 2016-03-17 DIAGNOSIS — T39395A Adverse effect of other nonsteroidal anti-inflammatory drugs [NSAID], initial encounter: Secondary | ICD-10-CM | POA: Diagnosis present

## 2016-03-17 DIAGNOSIS — K766 Portal hypertension: Secondary | ICD-10-CM | POA: Diagnosis present

## 2016-03-17 DIAGNOSIS — K449 Diaphragmatic hernia without obstruction or gangrene: Secondary | ICD-10-CM | POA: Diagnosis present

## 2016-03-17 DIAGNOSIS — D649 Anemia, unspecified: Secondary | ICD-10-CM

## 2016-03-17 DIAGNOSIS — K641 Second degree hemorrhoids: Secondary | ICD-10-CM | POA: Diagnosis present

## 2016-03-17 LAB — COMPREHENSIVE METABOLIC PANEL
ALK PHOS: 93 U/L (ref 38–126)
ALT: 12 U/L — AB (ref 14–54)
AST: 22 U/L (ref 15–41)
Albumin: 3.9 g/dL (ref 3.5–5.0)
Anion gap: 5 (ref 5–15)
BUN: 8 mg/dL (ref 6–20)
CALCIUM: 9.5 mg/dL (ref 8.9–10.3)
CHLORIDE: 109 mmol/L (ref 101–111)
CO2: 22 mmol/L (ref 22–32)
CREATININE: 0.57 mg/dL (ref 0.44–1.00)
GFR calc non Af Amer: >60 mL/min (ref >60)
GLUCOSE: 90 mg/dL (ref 65–99)
Potassium: 3.7 mmol/L (ref 3.5–5.1)
SODIUM: 136 mmol/L (ref 135–145)
Total Bilirubin: 0.6 mg/dL (ref 0.3–1.2)
Total Protein: 7.4 g/dL (ref 6.5–8.1)

## 2016-03-17 LAB — LIPASE, BLOOD: Lipase: 26 U/L (ref 11–51)

## 2016-03-17 LAB — IRON AND TIBC
IRON: 10 ug/dL — AB (ref 28–170)
Saturation Ratios: 2 % — ABNORMAL LOW (ref 10.4–31.8)
TIBC: 503 ug/dL — AB (ref 250–450)
UIBC: 493 ug/dL

## 2016-03-17 LAB — RETICULOCYTES
RBC.: 3.52 MIL/uL — AB (ref 3.80–5.20)
RETIC COUNT ABSOLUTE: 77.4 10*3/uL (ref 19.0–183.0)
RETIC CT PCT: 2.2 % (ref 0.4–3.1)

## 2016-03-17 LAB — CBC
HCT: 23.6 % — ABNORMAL LOW (ref 35.0–47.0)
Hemoglobin: 7 g/dL — ABNORMAL LOW (ref 12.0–16.0)
MCH: 19.6 pg — AB (ref 26.0–34.0)
MCHC: 29.6 g/dL — AB (ref 32.0–36.0)
MCV: 66.3 fL — AB (ref 80.0–100.0)
PLATELETS: 509 10*3/uL — AB (ref 150–440)
RBC: 3.56 MIL/uL — AB (ref 3.80–5.20)
RDW: 24.1 % — AB (ref 11.5–14.5)
WBC: 6.9 10*3/uL (ref 3.6–11.0)

## 2016-03-17 LAB — SEDIMENTATION RATE: Sed Rate: 33 mm/hr — ABNORMAL HIGH (ref 0–20)

## 2016-03-17 LAB — PROTIME-INR
INR: 1.12
Prothrombin Time: 14.5 seconds (ref 11.4–15.2)

## 2016-03-17 LAB — TROPONIN I

## 2016-03-17 LAB — FERRITIN: FERRITIN: 2 ng/mL — AB (ref 11–307)

## 2016-03-17 LAB — APTT: aPTT: 33 seconds (ref 24–36)

## 2016-03-17 MED ORDER — MIRTAZAPINE 15 MG PO TABS
30.0000 mg | ORAL_TABLET | Freq: Every day | ORAL | Status: DC
  Administered 2016-03-17 – 2016-03-18 (×2): 30 mg via ORAL
  Filled 2016-03-17 (×2): qty 2

## 2016-03-17 MED ORDER — ACETAMINOPHEN 325 MG PO TABS
650.0000 mg | ORAL_TABLET | Freq: Four times a day (QID) | ORAL | Status: DC | PRN
  Administered 2016-03-18: 650 mg via ORAL
  Filled 2016-03-17: qty 2

## 2016-03-17 MED ORDER — FAMOTIDINE 20 MG PO TABS
40.0000 mg | ORAL_TABLET | Freq: Every day | ORAL | Status: DC
  Administered 2016-03-17: 40 mg via ORAL
  Filled 2016-03-17: qty 2

## 2016-03-17 MED ORDER — ONDANSETRON HCL 4 MG/2ML IJ SOLN
4.0000 mg | Freq: Four times a day (QID) | INTRAMUSCULAR | Status: DC | PRN
  Administered 2016-03-17: 4 mg via INTRAVENOUS
  Filled 2016-03-17: qty 2

## 2016-03-17 MED ORDER — DIPHENHYDRAMINE HCL 25 MG PO CAPS
25.0000 mg | ORAL_CAPSULE | Freq: Once | ORAL | Status: AC
  Administered 2016-03-17: 25 mg via ORAL

## 2016-03-17 MED ORDER — SODIUM CHLORIDE 0.9% FLUSH
3.0000 mL | Freq: Two times a day (BID) | INTRAVENOUS | Status: DC
  Administered 2016-03-17: 3 mL via INTRAVENOUS

## 2016-03-17 MED ORDER — ACETAMINOPHEN 650 MG RE SUPP: 650.0000 mg | Freq: Four times a day (QID) | RECTAL | Status: DC | PRN

## 2016-03-17 MED ORDER — ONDANSETRON HCL 4 MG PO TABS: 4.0000 mg | ORAL_TABLET | Freq: Four times a day (QID) | ORAL | Status: DC | PRN

## 2016-03-17 MED ORDER — FLUTICASONE PROPIONATE 50 MCG/ACT NA SUSP
2.0000 | Freq: Every day | NASAL | Status: DC
  Administered 2016-03-18: 2 via NASAL
  Filled 2016-03-17: qty 16

## 2016-03-17 MED ORDER — DOCUSATE SODIUM 100 MG PO CAPS
100.0000 mg | ORAL_CAPSULE | Freq: Two times a day (BID) | ORAL | Status: DC
  Administered 2016-03-17 – 2016-03-18 (×2): 100 mg via ORAL
  Filled 2016-03-17: qty 1

## 2016-03-17 MED ORDER — DIPHENHYDRAMINE HCL 25 MG PO CAPS
ORAL_CAPSULE | ORAL | Status: AC
  Filled 2016-03-17: qty 1

## 2016-03-17 MED ORDER — SODIUM CHLORIDE 0.9% FLUSH
3.0000 mL | Freq: Two times a day (BID) | INTRAVENOUS | Status: DC
  Administered 2016-03-17 – 2016-03-19 (×2): 3 mL via INTRAVENOUS

## 2016-03-17 MED ORDER — OLANZAPINE 10 MG PO TABS
20.0000 mg | ORAL_TABLET | Freq: Every day | ORAL | Status: DC
  Administered 2016-03-17 – 2016-03-18 (×2): 20 mg via ORAL
  Filled 2016-03-17 (×3): qty 2

## 2016-03-17 MED ORDER — PANTOPRAZOLE SODIUM 40 MG IV SOLR
80.0000 mg | Freq: Once | INTRAVENOUS | Status: AC
  Administered 2016-03-17: 80 mg via INTRAVENOUS
  Filled 2016-03-17: qty 80

## 2016-03-17 MED ORDER — SODIUM CHLORIDE 0.9 % IV SOLN
8.0000 mg/h | INTRAVENOUS | Status: DC
  Administered 2016-03-17 – 2016-03-19 (×4): 8 mg/h via INTRAVENOUS
  Filled 2016-03-17 (×4): qty 80

## 2016-03-17 MED ORDER — HYDROMORPHONE HCL 1 MG/ML IJ SOLN
1.0000 mg | INTRAMUSCULAR | Status: DC | PRN
  Administered 2016-03-17 – 2016-03-18 (×2): 1 mg via INTRAVENOUS
  Filled 2016-03-17 (×2): qty 1

## 2016-03-17 MED ORDER — SODIUM CHLORIDE 0.9 % IV SOLN
250.0000 mL | INTRAVENOUS | Status: DC | PRN
  Administered 2016-03-19: 10:00:00 via INTRAVENOUS

## 2016-03-17 MED ORDER — ALBUTEROL SULFATE (2.5 MG/3ML) 0.083% IN NEBU: 3.0000 mL | INHALATION_SOLUTION | Freq: Four times a day (QID) | RESPIRATORY_TRACT | Status: DC | PRN

## 2016-03-17 MED ORDER — BISACODYL 10 MG RE SUPP
10.0000 mg | Freq: Every day | RECTAL | Status: DC | PRN
  Administered 2016-03-19: 10 mg via RECTAL
  Filled 2016-03-17: qty 1

## 2016-03-17 MED ORDER — FENTANYL CITRATE (PF) 100 MCG/2ML IJ SOLN
50.0000 ug | Freq: Once | INTRAMUSCULAR | Status: AC
  Administered 2016-03-17: 50 ug via INTRAVENOUS
  Filled 2016-03-17: qty 2

## 2016-03-17 MED ORDER — SODIUM CHLORIDE 0.9% FLUSH: 3.0000 mL | INTRAVENOUS | Status: DC | PRN

## 2016-03-17 MED ORDER — METOCLOPRAMIDE HCL 5 MG/ML IJ SOLN
10.0000 mg | Freq: Once | INTRAMUSCULAR | Status: AC
  Administered 2016-03-17: 10 mg via INTRAVENOUS
  Filled 2016-03-17: qty 2

## 2016-03-17 MED ORDER — DIAZEPAM 2 MG PO TABS: 2.0000 mg | ORAL_TABLET | Freq: Three times a day (TID) | ORAL | Status: DC | PRN

## 2016-03-17 NOTE — H&P (Signed)
History and Physical    Sherry Little [id number] DOB: [birth year] DOA: 03/17/2016  Referring physician: Dr. Mariea Clonts PCP: [facility]  Specialists: none  Chief Complaint: abdominal pain  HPI: Sherry Little is a 45 y.o. female has a past medical history significant for valvular heart disease s/p VSD repair and chronic anemia now with worsening anemia associated with 2 day hx of severe epigatric pain. Some nausea and diarrhea. No vomiting. Denies fever, CP or SOB. No melena or BRBPR. She is now admitted.  Review of Systems: The patient denies anorexia, fever, weight loss,, vision loss, decreased hearing, hoarseness, chest pain, syncope, dyspnea on exertion, peripheral edema, balance deficits, hemoptysis, melena, hematochezia, severe indigestion/heartburn, hematuria, incontinence, genital sores, muscle weakness, suspicious skin lesions, transient blindness, difficulty walking, depression, unusual weight change, abnormal bleeding, enlarged lymph nodes, angioedema, and breast masses.   Past Medical History:  Diagnosis Date  . Heart murmur   . Hypertension   . VSD (ventricular septal defect and aortic arch hypoplasia    Past Surgical History:  Procedure Laterality Date  . GANGLION CYST EXCISION  2008   left wrist  . HAND SURGERY     cyst romved from left thumb  . TUBAL LIGATION     Social History:  reports that she has been smoking Cigarettes.  She has been smoking about 0.50 packs per day. She has never used smokeless tobacco. She reports that she drinks alcohol. She reports that she uses drugs, including Marijuana.  Allergies  Allergen Reactions  . Bee Venom Swelling  . Mushroom Ext Cmplx(Shiitake-Reishi-Mait) Itching and Swelling    All mushrooms   . Mobic [Meloxicam] Rash  . Tramadol Rash    History reviewed. No pertinent family history.  Prior to Admission medications   Medication Sig Start Date End Date Taking? Authorizing Provider  albuterol (PROVENTIL  HFA;VENTOLIN HFA) 108 (90 Base) MCG/ACT inhaler Inhale 2 puffs into the lungs every 6 (six) hours as needed for wheezing or shortness of breath. 05/22/15   Orbie Pyo, MD  albuterol (PROVENTIL HFA;VENTOLIN HFA) 108 [phone] Base) MCG/ACT inhaler Inhale 2 puffs into the lungs every 6 (six) hours as needed for wheezing or shortness of breath. 12/28/15   Jenise V Bacon Menshew, PA-C  ALPRAZolam (XANAX) 0.5 MG tablet Take 1 tablet (0.5 mg total) by mouth at bedtime as needed for anxiety. 10/20/13   Cleatrice Burke, PA-C  ALPRAZolam Duanne Moron) 1 MG tablet Take 1 mg by mouth 3 (three) times daily.    Historical Provider, MD  diazepam (VALIUM) 2 MG tablet Take 1 tablet (2 mg total) by mouth every 8 (eight) hours as needed for muscle spasms. 02/11/16   Paulette Blanch, MD  diphenhydrAMINE (BENADRYL) 25 MG tablet Take 1 tablet (25 mg total) by mouth every 6 (six) hours as needed for itching (Rash). 10/20/13   Cleatrice Burke, PA-C  fluticasone (FLONASE) 50 MCG/ACT nasal spray Place 2 sprays into both nostrils daily. 12/28/15   Jenise V Bacon Menshew, PA-C  mirtazapine (REMERON) 30 MG tablet Take 30 mg by mouth at bedtime.    Historical Provider, MD  OLANZapine (ZYPREXA) 20 MG tablet Take 20 mg by mouth at bedtime.    Historical Provider, MD   Physical Exam: Vitals:   03/17/16 1412  BP: (!) 146/54  Pulse: (!) 56  Resp: 18  Temp: 97.9 F (36.6 C)  TempSrc: Oral  SpO2: 100%  Weight: 56.7 kg (125 lb)  Height: 5\' 2"  (1.575 m)  General:  No apparent distress, thin, [place]/AT  Eyes: PERRL, EOMI, no scleral icterus, conjunctiva pale  ENT: moist oropharynx without exudate, TM's benign, dentition fair  Neck: supple, no lymphadenopathy. No bruits or thyromegaly  Cardiovascular: regular rate with 2/6 systolic murmur noted throughout the precordium. No rubs or gallops; 2+ peripheral pulses, no JVD, no peripheral edema  Respiratory: CTA biL, good air movement without wheezing, rhonchi or crackled. Respiratory  effort normal  Abdomen: soft, tender to palpation in the epigastrum with guarding but no rebound, positive bowel sounds, no masses  Skin: no rashes or lesions  Musculoskeletal: normal bulk and tone, no joint swelling  Psychiatric: normal mood and affect, A&OX3  Neurologic: CN 2-12 grossly intact, Motor strength 5/5 in all 4 groups with symmetric DTR's and non-focal sensory exam  Labs on Admission:  Basic Metabolic Panel:  Recent Labs Lab 03/17/16 1418  NA 136  K 3.7  CL 109  CO2 22  GLUCOSE 90  BUN 8  CREATININE 0.57  CALCIUM 9.5   Liver Function Tests:  Recent Labs Lab 03/17/16 1418  AST 22  ALT 12*  ALKPHOS 93  BILITOT 0.6  PROT 7.4  ALBUMIN 3.9    Recent Labs Lab 03/17/16 1418  LIPASE 26   No results for input(s): AMMONIA in the last 168 hours. CBC:  Recent Labs Lab 03/17/16 1418  WBC 6.9  HGB 7.0*  HCT 23.6*  MCV 66.3*  PLT 509*   Cardiac Enzymes: No results for input(s): CKTOTAL, CKMB, CKMBINDEX, TROPONINI in the last 168 hours.  BNP (last 3 results) No results for input(s): BNP in the last 8760 hours.  ProBNP (last 3 results) No results for input(s): PROBNP in the last 8760 hours.  CBG: No results for input(s): GLUCAP in the last 168 hours.  Radiological Exams on Admission: Dg Chest 2 View  Result Date: 03/17/2016 CLINICAL DATA:  Shortness of Breath EXAM: CHEST  2 VIEW COMPARISON:  12/28/2015 FINDINGS: The heart size and mediastinal contours are within normal limits. Both lungs are clear. The visualized skeletal structures are unremarkable. IMPRESSION: No active cardiopulmonary disease. Electronically Signed   By: Inez Catalina M.D.   On: 03/17/2016 16:48    EKG: Independently reviewed.  Assessment/Plan Principal Problem:   Epigastric pain Active Problems:   Microcytic anemia   Valvular heart disease   Bradycardia   Will admit to floor and send off anemia labs and guaiac all stools. Protonix drip started in ER. Clear liquid  diet. Hold off on transfusion for now at pt's request. Consult GI and Hematology. Repeat labs in AM.  Diet: clear liquids Fluids: saline lock DVT Prophylaxis: TED hose  Code Status: FULL  Family Communication: none  Disposition Plan: home  Time spent: 55 min

## 2016-03-17 NOTE — ED Notes (Signed)
Pt called to triage from waiting area without answer.

## 2016-03-17 NOTE — ED Notes (Signed)
Patient c/o nausea and vomited a small amount.

## 2016-03-17 NOTE — ED Notes (Signed)
Patient is in imaging

## 2016-03-17 NOTE — ED Provider Notes (Signed)
[facility] Emergency Department Provider Note  ____________________________________________  Time seen: Approximately 4:27 PM  I have reviewed the triage vital signs and the nursing notes.   HISTORY  Chief Complaint Abdominal Pain and Diarrhea    HPI Sherry Little is a 45 y.o. female with a history of HTN, VSD and aortic arch hypoplasia, presenting with epigastric pain, exertional shortness of breath, and found to be anemic here. The patient reports that for the past several days she has hadepigastric abdominal cramping with normal stools. She specifically denies constipation or diarrhea, her last bowel movement was this morning and was brown, not black or tarry. The pain is worse with eating. No fevers, chills, blood in the stool, nausea or vomiting. She has had some exertional shortness of breath but no orthostatic lightheadedness.   Past Medical History:  Diagnosis Date  . Heart murmur   . Hypertension   . VSD (ventricular septal defect and aortic arch hypoplasia     Patient Active Problem List   Diagnosis Date Noted  . Epigastric pain 03/17/2016  . Microcytic anemia 03/17/2016  . Valvular heart disease 03/17/2016  . Bradycardia 03/17/2016  . Abdominal pain 03/17/2016    Past Surgical History:  Procedure Laterality Date  . GANGLION CYST EXCISION  2008   left wrist  . HAND SURGERY     cyst romved from left thumb  . TUBAL LIGATION        Allergies Bee venom; Mushroom ext cmplx(shiitake-reishi-mait); Mobic [meloxicam]; and Tramadol  History reviewed. No pertinent family history.  Social History Social History  Substance Use Topics  . Smoking status: Current Every Day Smoker    Packs/day: 0.50    Types: Cigarettes  . Smokeless tobacco: Never Used  . Alcohol use Yes     Comment: occas    Review of Systems Constitutional: No fever/chills.No lightheadedness. No syncope. Eyes: No visual changes. ENT: No sore throat. No congestion or  rhinorrhea. Cardiovascular: Denies chest pain. Denies palpitations. Respiratory: Positive exertional shortness of breath.  No cough. Gastrointestinal: Positive epigastric abdominal pain.  No nausea, no vomiting.  No diarrhea.  No constipation. Genitourinary: Negative for dysuria. No melena. Bloody stool. Musculoskeletal: Negative for back pain. Skin: Negative for rash. Neurological: Negative for headaches. No focal numbness, tingling or weakness.   10-point ROS otherwise negative.  ____________________________________________   PHYSICAL EXAM:  VITAL SIGNS: ED Triage Vitals  Enc Vitals Group     BP 03/17/16 1412 (!) 146/54     Pulse Rate 03/17/16 1412 (!) 56     Resp 03/17/16 1412 18     Temp 03/17/16 1412 97.9 F (36.6 C)     Temp Source 03/17/16 1412 Oral     SpO2 03/17/16 1412 100 %     Weight 03/17/16 1412 125 lb (56.7 kg)     Height 03/17/16 1412 5\' 2"  (1.575 m)     Head Circumference --      Peak Flow --      Pain Score 03/17/16 1413 10     Pain Loc --      Pain Edu? --      Excl. in [place]? --     Constitutional: Alert and oriented. Well appearing and in no acute distress. Answers questions appropriately. Eyes: Conjunctivae are normalAnd without pallor.  EOMI. No scleral icterus. Head: Atraumatic. Nose: No congestion/rhinnorhea. Mouth/Throat: Mucous membranes are moist.  Neck: No stridor.  Supple.   Cardiovascular: Normal rate, regular rhythm. Holosystolic murmur without rubs or gallops.  Respiratory: Normal respiratory effort.  No accessory muscle use or retractions. Lungs CTAB.  No wheezes, rales or ronchi. Gastrointestinal: Soft and nondistended.  No guarding or rebound.  No peritoneal signs.Tender to palpation in the epigastrium. Genitourinary: No evidence of external or palpable internal hemorrhoids. Stool is dark, but guaiac negative. Musculoskeletal: No LE edema. No ttp in the calves or palpable cords.  Negative Homan's sign. Neurologic:  A&Ox3.  Speech is clear.   Face and smile are symmetric.  EOMI.  Moves all extremities well. Skin:  Skin is warm, dry and intact and without pallor. No rash noted. Psychiatric: Mood and affect are normal. Speech and behavior are normal.  Normal judgement.  ____________________________________________   LABS (all labs ordered are listed, but only abnormal results are displayed)  Labs Reviewed  COMPREHENSIVE METABOLIC PANEL - Abnormal; Notable for the following:       Result Value   ALT 12 (*)    All other components within normal limits  CBC - Abnormal; Notable for the following:    RBC 3.56 (*)    Hemoglobin 7.0 (*)    HCT 23.6 (*)    MCV 66.3 (*)    MCH 19.6 (*)    MCHC 29.6 (*)    RDW 24.1 (*)    Platelets 509 (*)    All other components within normal limits  IRON AND TIBC - Abnormal; Notable for the following:    Iron 10 (*)    TIBC 503 (*)    Saturation Ratios 2 (*)    All other components within normal limits  FERRITIN - Abnormal; Notable for the following:    Ferritin 2 (*)    All other components within normal limits  SEDIMENTATION RATE - Abnormal; Notable for the following:    Sed Rate 33 (*)    All other components within normal limits  RETICULOCYTES - Abnormal; Notable for the following:    RBC. 3.52 (*)    All other components within normal limits  LIPASE, BLOOD  APTT  PROTIME-INR  TROPONIN I  URINALYSIS, COMPLETE (UACMP) WITH MICROSCOPIC  VITAMIN B12  OCCULT BLOOD X 1 CARD TO LAB, STOOL  COMPREHENSIVE METABOLIC PANEL  CBC  TYPE AND SCREEN   ____________________________________________  EKG  ED ECG REPORT I, Eula Listen, the attending physician, personally viewed and interpreted this ECG.   Date: 03/17/2016  Rate: 59  Rhythm: sinus bradycardia  Axis: normal  Intervals:none  ST&T Change: No STEMI  ____________________________________________  RADIOLOGY  Dg Chest 2 View  Result Date: 03/17/2016 CLINICAL DATA:  Shortness of Breath EXAM: CHEST  2 VIEW  COMPARISON:  12/28/2015 FINDINGS: The heart size and mediastinal contours are within normal limits. Both lungs are clear. The visualized skeletal structures are unremarkable. IMPRESSION: No active cardiopulmonary disease. Electronically Signed   By: Inez Catalina M.D.   On: 03/17/2016 16:48    ____________________________________________   PROCEDURES  Procedure(s) performed: None  Procedures  Critical Care performed: No ____________________________________________   INITIAL IMPRESSION / ASSESSMENT AND PLAN / ED COURSE  Pertinent labs & imaging results that were available during my care of the patient were reviewed by me and considered in my medical decision making (see chart for details).  45 y.o. female with a history of VSD and aortic arch hypoplasia, chronic anemia, presenting with epigastric pain, exertional shortness of breath. Overall, the patient has reassuring vital signs. She does have tenderness in the epigastrium and I'm concerned about peptic ulcer or gastric ulcer; she does not have  melena or blood from below today, but I will initiate her on protonic's. I talked to the patient about transfusion, and she says "I don't believe in that" because "I have some friends and got some bad blood." She will be calling her sister to make a final decision about transfusion. It is possible that her anemia is due to her anatomical abnormalities and chronic disease. I will plan to admit the patient for further evaluation and treatment.  ____________________________________________  FINAL CLINICAL IMPRESSION(S) / ED DIAGNOSES  Final diagnoses:  Epigastric pain  DOE (dyspnea on exertion)  Symptomatic anemia    Clinical Course       NEW MEDICATIONS STARTED DURING THIS VISIT:  Current Discharge Medication List        Eula Listen, MD 03/17/16 2053

## 2016-03-17 NOTE — ED Notes (Signed)
Waiting for transport

## 2016-03-17 NOTE — ED Triage Notes (Signed)
Pt presents to ED with c/o abdominal pain that started 2 days ago. Pt states when she eats she has cramps, c/o 2 episodes of diarrhea, denies any blood. Pt states nothing relieves abdominal pain but states "I have to just let it ease off". Pt is alert and oriented, NAD noted at this time.

## 2016-03-17 NOTE — ED Notes (Signed)
Update given to [facility] on the floor.

## 2016-03-18 DIAGNOSIS — N920 Excessive and frequent menstruation with regular cycle: Secondary | ICD-10-CM

## 2016-03-18 DIAGNOSIS — R1013 Epigastric pain: Secondary | ICD-10-CM

## 2016-03-18 DIAGNOSIS — I10 Essential (primary) hypertension: Secondary | ICD-10-CM

## 2016-03-18 DIAGNOSIS — Q210 Ventricular septal defect: Secondary | ICD-10-CM

## 2016-03-18 DIAGNOSIS — R634 Abnormal weight loss: Secondary | ICD-10-CM

## 2016-03-18 DIAGNOSIS — D509 Iron deficiency anemia, unspecified: Secondary | ICD-10-CM

## 2016-03-18 DIAGNOSIS — D649 Anemia, unspecified: Secondary | ICD-10-CM

## 2016-03-18 DIAGNOSIS — F17210 Nicotine dependence, cigarettes, uncomplicated: Secondary | ICD-10-CM

## 2016-03-18 LAB — COMPREHENSIVE METABOLIC PANEL
ALK PHOS: 89 U/L (ref 38–126)
ALT: 11 U/L — ABNORMAL LOW (ref 14–54)
AST: 21 U/L (ref 15–41)
Albumin: 3.7 g/dL (ref 3.5–5.0)
Anion gap: 4 — ABNORMAL LOW (ref 5–15)
BILIRUBIN TOTAL: 0.6 mg/dL (ref 0.3–1.2)
BUN: 7 mg/dL (ref 6–20)
CALCIUM: 9.5 mg/dL (ref 8.9–10.3)
CO2: 23 mmol/L (ref 22–32)
Chloride: 110 mmol/L (ref 101–111)
Creatinine, Ser: 0.56 mg/dL (ref 0.44–1.00)
GFR calc Af Amer: >60 mL/min (ref >60)
Glucose, Bld: 95 mg/dL (ref 65–99)
POTASSIUM: 3.7 mmol/L (ref 3.5–5.1)
Sodium: 137 mmol/L (ref 135–145)
TOTAL PROTEIN: 6.8 g/dL (ref 6.5–8.1)

## 2016-03-18 LAB — URINALYSIS, COMPLETE (UACMP) WITH MICROSCOPIC
BACTERIA UA: NONE SEEN
BILIRUBIN URINE: NEGATIVE
Glucose, UA: NEGATIVE mg/dL
Hgb urine dipstick: NEGATIVE
KETONES UR: NEGATIVE mg/dL
Nitrite: NEGATIVE
PROTEIN: NEGATIVE mg/dL
RBC / HPF: NONE SEEN RBC/hpf (ref 0–5)
Specific Gravity, Urine: 1.004 — ABNORMAL LOW (ref 1.005–1.030)
pH: 6 (ref 5.0–8.0)

## 2016-03-18 LAB — CBC
HEMATOCRIT: 23.4 % — AB (ref 35.0–47.0)
Hemoglobin: 6.9 g/dL — ABNORMAL LOW (ref 12.0–16.0)
MCH: 19.5 pg — ABNORMAL LOW (ref 26.0–34.0)
MCHC: 29.3 g/dL — ABNORMAL LOW (ref 32.0–36.0)
MCV: 66.5 fL — ABNORMAL LOW (ref 80.0–100.0)
PLATELETS: 516 10*3/uL — AB (ref 150–440)
RBC: 3.52 MIL/uL — ABNORMAL LOW (ref 3.80–5.20)
RDW: 24 % — AB (ref 11.5–14.5)
WBC: 6.9 10*3/uL (ref 3.6–11.0)

## 2016-03-18 LAB — ABO/RH: ABO/RH(D): A POS

## 2016-03-18 LAB — PREPARE RBC (CROSSMATCH)

## 2016-03-18 LAB — VITAMIN B12: VITAMIN B 12: 717 pg/mL (ref 180–914)

## 2016-03-18 LAB — DAT, POLYSPECIFIC AHG (ARMC ONLY): POLYSPECIFIC AHG TEST: NEGATIVE

## 2016-03-18 LAB — HEMOGLOBIN AND HEMATOCRIT, BLOOD
HEMATOCRIT: 21.8 % — AB (ref 35.0–47.0)
HEMOGLOBIN: 6.4 g/dL — AB (ref 12.0–16.0)

## 2016-03-18 MED ORDER — SODIUM CHLORIDE 0.9 % IV SOLN
Freq: Once | INTRAVENOUS | Status: AC
  Administered 2016-03-18: 19:00:00 via INTRAVENOUS

## 2016-03-18 MED ORDER — SODIUM CHLORIDE 0.9 % IV SOLN: Freq: Once | INTRAVENOUS | Status: DC

## 2016-03-18 MED ORDER — SODIUM CHLORIDE 0.9 % IV SOLN
Freq: Once | INTRAVENOUS | Status: AC
  Administered 2016-03-18: 23:00:00 via INTRAVENOUS

## 2016-03-18 MED ORDER — BOOST / RESOURCE BREEZE PO LIQD
1.0000 | Freq: Three times a day (TID) | ORAL | Status: DC
  Administered 2016-03-18 (×2): 1 via ORAL

## 2016-03-18 MED ORDER — PEG 3350-KCL-NA BICARB-NACL 420 G PO SOLR
4000.0000 mL | Freq: Once | ORAL | Status: AC
  Administered 2016-03-18: 4000 mL via ORAL
  Filled 2016-03-18: qty 4000

## 2016-03-18 MED ORDER — SODIUM CHLORIDE 0.9 % IV SOLN
150.0000 mg | Freq: Once | INTRAVENOUS | Status: AC
Start: 2016-03-18 — End: 2016-03-18
  Administered 2016-03-18: 150 mg via INTRAVENOUS
  Filled 2016-03-18: qty 7.5

## 2016-03-18 MED ORDER — DIPHENHYDRAMINE HCL 25 MG PO CAPS
25.0000 mg | ORAL_CAPSULE | Freq: Three times a day (TID) | ORAL | Status: DC | PRN
  Administered 2016-03-18: 25 mg via ORAL
  Filled 2016-03-18: qty 1

## 2016-03-18 NOTE — Progress Notes (Signed)
Dr Allen Norris concerned that anesthesia might not approve for EGD and Colonoscopy to be done tomorrow with low hgb.  He recommended that the pt have 2 units of blood rather than 1 that is currently ordered.  Dr Vianne Bulls notified and ordered for pt to have a 2nd unit

## 2016-03-18 NOTE — Progress Notes (Signed)
Pt alert with family member in room. CH is available.   03/18/16 1110  Clinical Encounter Type  Visited With Patient and family together  Visit Type Initial  Referral From Nurse  Spiritual Encounters  Spiritual Needs Emotional  Stress Factors  Patient Stress Factors None identified  Family Stress Factors None identified

## 2016-03-18 NOTE — Progress Notes (Signed)
Reported Hgb of 6.9 to Dr. Estanislado Pandy. No new orders at this time

## 2016-03-18 NOTE — Progress Notes (Signed)
[place] [place] NAME: Elesia Norrick    MR#:  [id number]  DATE OF BIRTH:  [birth year]  SUBJECTIVE: Admitted for abdominal cramps, found to have severe anemia. Patient has severe iron deficiency anemia. She feels better today and no abdominal pain or melena. Has been taking ibuprofen now about 2-3 tablets for the last 3 months. And she is taking it for back pain.   CHIEF COMPLAINT:   Chief Complaint  Patient presents with  . Abdominal Pain  . Diarrhea    REVIEW OF SYSTEMS:   ROS CONSTITUTIONAL: No fever, fatigue or weakness.  EYES: No blurred or double vision.  EARS, NOSE, AND THROAT: No tinnitus or ear pain.  RESPIRATORY: No cough, shortness of breath, wheezing or hemoptysis.  CARDIOVASCULAR: No chest pain, orthopnea, edema.  GASTROINTESTINAL: No nausea, vomiting, diarrhea or abdominal pain.  GENITOURINARY: No dysuria, hematuria.  ENDOCRINE: No polyuria, nocturia,  HEMATOLOGY: No anemia, easy bruising or bleeding SKIN: No rash or lesion. MUSCULOSKELETAL: No joint pain or arthritis.   NEUROLOGIC: No tingling, numbness, weakness.  PSYCHIATRY: No anxiety or depression.   DRUG ALLERGIES:   Allergies  Allergen Reactions  . Bee Venom Swelling  . Mushroom Ext Cmplx(Shiitake-Reishi-Mait) Itching and Swelling    All mushrooms   . Mobic [Meloxicam] Rash  . Tramadol Rash    VITALS:  Blood pressure (!) 113/52, pulse 98, temperature 98.2 F (36.8 C), temperature source Oral, resp. rate 20, height 5\' 2"  (1.575 m), weight 52.6 kg (116 lb), last menstrual period 03/11/2016, SpO2 97 %.  PHYSICAL EXAMINATION:  GENERAL:  45 y.o.-year-old patient lying in the bed with no acute distress.  EYES: Pupils equal, round, reactive to light and accommodation. No scleral icterus. Extraocular muscles intact.  HEENT: Head atraumatic, normocephalic. Oropharynx and nasopharynx clear.  NECK:  Supple, no jugular venous distention. No thyroid  enlargement, no tenderness.  LUNGS: Normal breath sounds bilaterally, no wheezing, rales,rhonchi or crepitation. No use of accessory muscles of respiration.  CARDIOVASCULAR: S1, S2 normal. No murmurs, rubs, or gallops.  ABDOMEN: Soft, nontender, nondistended. Bowel sounds present. No organomegaly or mass.  EXTREMITIES: No pedal edema, cyanosis, or clubbing.  NEUROLOGIC: Cranial nerves II through XII are intact. Muscle strength 5/5 in all extremities. Sensation intact. Gait not checked.  PSYCHIATRIC: The patient is alert and oriented x 3.  SKIN: No obvious rash, lesion, or ulcer.    LABORATORY PANEL:   CBC  Recent Labs Lab 03/18/16 0417  WBC 6.9  HGB 6.9*  HCT 23.4*  PLT 516*   ------------------------------------------------------------------------------------------------------------------  Chemistries   Recent Labs Lab 03/18/16 0417  NA 137  K 3.7  CL 110  CO2 23  GLUCOSE 95  BUN 7  CREATININE 0.56  CALCIUM 9.5  AST 21  ALT 11*  ALKPHOS 89  BILITOT 0.6   ------------------------------------------------------------------------------------------------------------------  Cardiac Enzymes  Recent Labs Lab 03/17/16 1704  TROPONINI <0.03   ------------------------------------------------------------------------------------------------------------------  RADIOLOGY:  Dg Chest 2 View  Result Date: 03/17/2016 CLINICAL DATA:  Shortness of Breath EXAM: CHEST  2 VIEW COMPARISON:  12/28/2015 FINDINGS: The heart size and mediastinal contours are within normal limits. Both lungs are clear. The visualized skeletal structures are unremarkable. IMPRESSION: No active cardiopulmonary disease. Electronically Signed   By: Inez Catalina M.D.   On: 03/17/2016 16:48    EKG:   Orders placed or performed during the hospital encounter of 03/17/16  . ED EKG  . ED EKG  . EKG 12-Lead  .  EKG 12-Lead    ASSESSMENT AND PLAN:   #1 severe symptomatic anemia: Likely secondary to slow  GI loss with NSAID induced gastritis/heavcy menstrual bleeding. For NSAID-induced gastritis: Continue the Protonix drip, GI consulted. 2.  Severe Iron deficiency anemia: IV Venofer ordered, transfuse 1 unit of packed RBC, discussed with patient about risks and benefits of transfusion.  3. Iron deficiency anemia in menstruating woman check ultrasound of the uterus to evaluate for fibroids. #4 hypertension;  All the records are reviewed and case discu E .ssed with Care Management/Social Workerr. Management plans discussed with the patient, family and they are in agreement.  CODE STATUS:full  TOTAL TIME TAKING CARE OF THIS PATIENT: 35  minutes.   POSSIBLE D/C IN 1-2 DAYS, DEPENDING ON CLINICAL CONDITION.   Epifanio Lesches M.D on 03/18/2016 at 2:48 PM  Between 7am to 6pm - Pager - [phone]  After 6pm go to [email or web address] - password [email or web address] [facility]  Office  [phone]  CC: Primary care physician; [facility]   Note: This dictation was prepared with Dragon dictation along with smaller phrase technology. Any transcriptional errors that result from this process are unintentional.

## 2016-03-18 NOTE — Consult Note (Signed)
Lucilla Lame, MD [address] [place], [place] [address] Phone: [phone] Fax : [phone]  Consultation  Referring Provider:     No ref. provider found Primary Care Physician:  [facility] Primary Gastroenterologist:  None         Reason for Consultation:     Iron deficiency anemia  Date of Admission:  03/17/2016 Date of Consultation:  03/18/2016         HPI:   Sherry Little is a 45 y.o. female who was admitted with a significant history of valvular heart disease and chronic anemia. The patient also reports that she's been having epigastric pain which she reports to be severe. The patient states that the pain is 10 out of 10. She also had some nausea and diarrhea. The patient denies ever seeing a gastrologist in the past. The patient does report that she has heavy menstrual periods. She denies any black stools or bloody stools. On admission the patient's hemoglobin was 7.0 which is gone down to 6.4 today. The patient also reports that she has been taking ibuprofen 2-3 tablets a day for the last 3 months. The ibuprofen is for back pain. She also reports that she is lost approximately 15 pounds in the last month.  Past Medical History:  Diagnosis Date  . Heart murmur   . Hypertension   . VSD (ventricular septal defect and aortic arch hypoplasia     Past Surgical History:  Procedure Laterality Date  . GANGLION CYST EXCISION  2008   left wrist  . HAND SURGERY     cyst romved from left thumb  . TUBAL LIGATION      Prior to Admission medications   Medication Sig Start Date End Date Taking? Authorizing Provider  albuterol (PROVENTIL HFA;VENTOLIN HFA) 108 (90 Base) MCG/ACT inhaler Inhale 2 puffs into the lungs every 6 (six) hours as needed for wheezing or shortness of breath. 05/22/15   Orbie Pyo, MD  albuterol (PROVENTIL HFA;VENTOLIN HFA) 108 [phone] Base) MCG/ACT inhaler Inhale 2 puffs into the lungs every 6 (six) hours as needed for wheezing  or shortness of breath. 12/28/15   Jenise V Bacon Menshew, PA-C  ALPRAZolam (XANAX) 0.5 MG tablet Take 1 tablet (0.5 mg total) by mouth at bedtime as needed for anxiety. 10/20/13   Cleatrice Burke, PA-C  ALPRAZolam Duanne Moron) 1 MG tablet Take 1 mg by mouth 3 (three) times daily.    Historical Provider, MD  diazepam (VALIUM) 2 MG tablet Take 1 tablet (2 mg total) by mouth every 8 (eight) hours as needed for muscle spasms. 02/11/16   Paulette Blanch, MD  diphenhydrAMINE (BENADRYL) 25 MG tablet Take 1 tablet (25 mg total) by mouth every 6 (six) hours as needed for itching (Rash). 10/20/13   Cleatrice Burke, PA-C  fluticasone (FLONASE) 50 MCG/ACT nasal spray Place 2 sprays into both nostrils daily. 12/28/15   Jenise V Bacon Menshew, PA-C  mirtazapine (REMERON) 30 MG tablet Take 30 mg by mouth at bedtime.    Historical Provider, MD  OLANZapine (ZYPREXA) 20 MG tablet Take 20 mg by mouth at bedtime.    Historical Provider, MD    History reviewed. No pertinent family history.   Social History  Substance Use Topics  . Smoking status: Current Every Day Smoker    Packs/day: 0.50    Types: Cigarettes  . Smokeless tobacco: Never Used  . Alcohol use Yes     Comment: occas    Allergies as of  03/17/2016 - Review Complete 03/17/2016  Allergen Reaction Noted  . Bee venom Swelling 03/17/2016  . Mushroom ext cmplx(shiitake-reishi-mait) Itching and Swelling 07/15/2011  . Mobic [meloxicam] Rash 11/14/2015  . Tramadol Rash 08/15/2014    Review of Systems:    All systems reviewed and negative except where noted in HPI.   Physical Exam:  Vital signs in last 24 hours: Temp:  [98 F (36.7 C)-98.2 F (36.8 C)] 98.2 F (36.8 C) (12/11 1300) Pulse Rate:  [44-98] 98 (12/11 1300) Resp:  [18-22] 20 (12/11 1300) BP: (113-161)/(52-88) 113/52 (12/11 1300) SpO2:  [97 %-100 %] 97 % (12/11 1300) Weight:  [116 lb (52.6 kg)] 116 lb (52.6 kg) (12/10 2024) Last BM Date: 03/17/16 General:   Pleasant, cooperative in NAD Head:   Normocephalic and atraumatic. Eyes:   No icterus.   Conjunctiva pink. PERRLA. Ears:  Normal auditory acuity. Neck:  Supple; no masses or thyroidomegaly Lungs: Respirations even and unlabored. Lungs clear to auscultation bilaterally.   No wheezes, crackles, or rhonchi.  Heart:  Regular rate and rhythm;  Without murmur, clicks, rubs or gallops Abdomen:  Soft, nondistended, nontender. Normal bowel sounds. No appreciable masses or hepatomegaly.  No rebound or guarding.  Rectal:  Not performed. Msk:  Symmetrical without gross deformities.    Extremities:  Without edema, cyanosis or clubbing. Neurologic:  Alert and oriented x3;  grossly normal neurologically. Skin:  Intact without significant lesions or rashes. Cervical Nodes:  No significant cervical adenopathy. Psych:  Alert and cooperative. Normal affect.  LAB RESULTS:  Recent Labs  03/17/16 1418 03/18/16 0417 03/18/16 1531  WBC 6.9 6.9  --   HGB 7.0* 6.9* 6.4*  HCT 23.6* 23.4* 21.8*  PLT 509* 516*  --    BMET  Recent Labs  03/17/16 1418 03/18/16 0417  NA 136 137  K 3.7 3.7  CL 109 110  CO2 22 23  GLUCOSE 90 95  BUN 8 7  CREATININE 0.57 0.56  CALCIUM 9.5 9.5   LFT  Recent Labs  03/18/16 0417  PROT 6.8  ALBUMIN 3.7  AST 21  ALT 11*  ALKPHOS 89  BILITOT 0.6   PT/INR  Recent Labs  03/17/16 1704  LABPROT 14.5  INR 1.12    STUDIES: Dg Chest 2 View  Result Date: 03/17/2016 CLINICAL DATA:  Shortness of Breath EXAM: CHEST  2 VIEW COMPARISON:  12/28/2015 FINDINGS: The heart size and mediastinal contours are within normal limits. Both lungs are clear. The visualized skeletal structures are unremarkable. IMPRESSION: No active cardiopulmonary disease. Electronically Signed   By: Inez Catalina M.D.   On: 03/17/2016 16:48      Impression / Plan:   Sherry Little is a 45 y.o. y/o female with Who was admitted with profound anemia. The patient has had a drop in her hemoglobin. This may be due to delusional since  there is no sign of any bleeding since admission. The patient has abdominal pain and has been taking NSAIDs. The patient also has lost 15 pounds in last 2 months. Due to the above-stated reason the patient will be set up for an EGD and colonoscopy. The patient has been explained the plan and will be set up for the procedures for tomorrow. She will undergo the prep tonight. I have discussed risks & benefits which include, but are not limited to, bleeding, infection, perforation & drug reaction.  The patient agrees with this plan & written consent will be obtained.      Thank you for  involving me in the care of this patient.      LOS: 1 day   Lucilla Lame, MD  03/18/2016, 4:36 PM   Note: This dictation was prepared with Dragon dictation along with smaller phrase technology. Any transcriptional errors that result from this process are unintentional.

## 2016-03-18 NOTE — Progress Notes (Signed)
Patient said she is itching due to the dilaudid and she needs to take benadryl when taking dilaudid.  Dr patel gave order for benadryl

## 2016-03-18 NOTE — Progress Notes (Signed)
Initial Nutrition Assessment  DOCUMENTATION CODES:   Severe malnutrition in context of acute illness/injury  INTERVENTION:  Provide Boost Breeze po TID, each supplement provides 250 kcal and 9 grams of protein.  Recommend Ensure Enlive when diet advanced past clears.  Encouraged adequate protein and calories through meals and beverages when diet advanced.  NUTRITION DIAGNOSIS:   Malnutrition (Severe) related to acute illness as evidenced by 14 percent weight loss over 1 month, moderate depletion of body fat, moderate depletions of muscle mass.  GOAL:   Patient will meet greater than or equal to 90% of their needs  MONITOR:   PO intake, Supplement acceptance, Diet advancement, Labs, Weight trends, I & O's  REASON FOR ASSESSMENT:   Malnutrition Screening Tool    ASSESSMENT:   45 y.o. female has a past medical history significant for valvular heart disease s/p VSD repair and chronic anemia now with worsening anemia associated with 2 day hx of severe epigatric pain.   Spoke with patient at bedside. She reports her appetite is good now. However, PTA she was experiencing early satiety and lack of hunger (anorexia) for "a while" (patient could not specify time period). She reports eating 3 meals per day and drinking 2-3 Ensure Plus per day at home. Patient reports nausea, diarrhea, abdominal pain for 2 days PTA and that they are all improving now.   Patient reports UBW of 110-130 lbs (could not narrow range). She is unsure if she has lost any weight. Per chart patient was 135 lbs on 11/5 (question accuracy as she was exactly the same weight 1 month prior). Per chart, she may have lost 19 lbs (14% body weight) over 1 month, which is significant for time frame.   Medications reviewed and include: Colace, famotidine, Remeron 30 mg daily HS, pantoprazole 80 mg in NS @ 25 ml/hr.  Labs reviewed: Anion gap 4, ALT 11.   Nutrition-Focused physical exam completed. Findings are mild-moderate  fat depletion, mild-moderate muscle depletion, and no edema.   Patient meets criteria for severe acute malnutrition in setting of 14% weight loss over 1 month per chart, moderate fat depletion, moderate muscle depletion.   Discussed with RN.   Diet Order:  Diet clear liquid Room service appropriate? Yes; Fluid consistency: Thin  Skin:  Reviewed, no issues  Last BM:  03/17/2016  Height:   Ht Readings from Last 1 Encounters:  03/17/16 5\' 2"  (1.575 m)    Weight:   Wt Readings from Last 1 Encounters:  03/17/16 116 lb (52.6 kg)    Ideal Body Weight:  50 kg  BMI:  Body mass index is 21.22 kg/m.  Estimated Nutritional Needs:   Kcal:  1460-1580 (HBE x 1.2-1.3)  Protein:  63-73 grams (1.2-1.4 grams/kg)  Fluid:  >/- 1.5 L/day (30 ml/kg)  EDUCATION NEEDS:   Education needs addressed  Willey Blade, MS, RD, LDN Pager: [phone] After Hours Pager: [phone]

## 2016-03-19 ENCOUNTER — Encounter
Admission: EM | Disposition: A | Discharge status: Home / Self Care | Payer: Self-pay | Source: Home / Self Care | Attending: Internal Medicine

## 2016-03-19 ENCOUNTER — Inpatient Hospital Stay: Payer: Self-pay

## 2016-03-19 ENCOUNTER — Inpatient Hospital Stay: Payer: Self-pay | Admitting: Anesthesiology

## 2016-03-19 DIAGNOSIS — K766 Portal hypertension: Secondary | ICD-10-CM

## 2016-03-19 DIAGNOSIS — K635 Polyp of colon: Secondary | ICD-10-CM

## 2016-03-19 DIAGNOSIS — D125 Benign neoplasm of sigmoid colon: Secondary | ICD-10-CM

## 2016-03-19 DIAGNOSIS — I8510 Secondary esophageal varices without bleeding: Secondary | ICD-10-CM

## 2016-03-19 DIAGNOSIS — K3189 Other diseases of stomach and duodenum: Secondary | ICD-10-CM

## 2016-03-19 DIAGNOSIS — K2970 Gastritis, unspecified, without bleeding: Secondary | ICD-10-CM

## 2016-03-19 DIAGNOSIS — R634 Abnormal weight loss: Secondary | ICD-10-CM

## 2016-03-19 DIAGNOSIS — D500 Iron deficiency anemia secondary to blood loss (chronic): Principal | ICD-10-CM

## 2016-03-19 HISTORY — PX: ESOPHAGOGASTRODUODENOSCOPY (EGD) WITH PROPOFOL: SHX5813

## 2016-03-19 HISTORY — PX: COLONOSCOPY WITH PROPOFOL: SHX5780

## 2016-03-19 LAB — POCT PREGNANCY, URINE: Preg Test, Ur: NEGATIVE

## 2016-03-19 LAB — HEMOGLOBIN AND HEMATOCRIT, BLOOD
HEMATOCRIT: 28.5 % — AB (ref 35.0–47.0)
HEMOGLOBIN: 9.1 g/dL — AB (ref 12.0–16.0)

## 2016-03-19 SURGERY — ESOPHAGOGASTRODUODENOSCOPY (EGD) WITH PROPOFOL
Anesthesia: General

## 2016-03-19 MED ORDER — BOOST / RESOURCE BREEZE PO LIQD: 1.0000 | Freq: Three times a day (TID) | ORAL | 0 refills | Status: DC

## 2016-03-19 MED ORDER — PROPOFOL 10 MG/ML IV BOLUS
INTRAVENOUS | Status: DC | PRN
  Administered 2016-03-19: 20 mg via INTRAVENOUS
  Administered 2016-03-19: 40 mg via INTRAVENOUS

## 2016-03-19 MED ORDER — MIDAZOLAM HCL 2 MG/2ML IJ SOLN
INTRAMUSCULAR | Status: DC | PRN
  Administered 2016-03-19: 1 mg via INTRAVENOUS

## 2016-03-19 MED ORDER — FENTANYL CITRATE (PF) 100 MCG/2ML IJ SOLN
INTRAMUSCULAR | Status: DC | PRN
  Administered 2016-03-19 (×2): 25 ug via INTRAVENOUS

## 2016-03-19 MED ORDER — FERROUS SULFATE 325 (65 FE) MG PO TABS: 325.0000 mg | ORAL_TABLET | Freq: Every day | ORAL | 3 refills | Status: DC

## 2016-03-19 MED ORDER — PROPOFOL 500 MG/50ML IV EMUL
INTRAVENOUS | Status: DC | PRN
  Administered 2016-03-19: 150 ug/kg/min via INTRAVENOUS

## 2016-03-19 NOTE — Transfer of Care (Signed)
Immediate Anesthesia Transfer of Care Note  Patient: Sherry Little  Procedure(s) Performed: Procedure(s): ESOPHAGOGASTRODUODENOSCOPY (EGD) WITH PROPOFOL (N/A) COLONOSCOPY WITH PROPOFOL (N/A)  Patient Location: PACU  Anesthesia Type:General  Level of Consciousness: awake  Airway & Oxygen Therapy: Patient Spontanous Breathing and Patient connected to nasal cannula oxygen  Post-op Assessment: Report given to RN and Post -op Vital signs reviewed and stable  Post vital signs: Reviewed and stable  Last Vitals:  Vitals:   03/19/16 0803 03/19/16 1017  BP: (!) 145/62 (!) 163/78  Pulse: (!) 51 (!) 45  Resp: 18 17  Temp:  36.1 C    Last Pain:  Vitals:   03/19/16 1017  TempSrc: Tympanic  PainSc:          Complications: No apparent anesthesia complications

## 2016-03-19 NOTE — Progress Notes (Signed)
Alert and oriented. Vital signs stable . No signs of acute distress. Discharge instructions given. Patient verbalized understanding. No other issues noted at this time.    

## 2016-03-19 NOTE — Anesthesia Procedure Notes (Signed)
Performed by: Gannon Heinzman Pre-anesthesia Checklist: Patient identified, Emergency Drugs available, Suction available, Patient being monitored and Timeout performed Patient Re-evaluated:Patient Re-evaluated prior to inductionOxygen Delivery Method: Nasal cannula Preoxygenation: Pre-oxygenation with 100% oxygen Intubation Type: IV induction       

## 2016-03-19 NOTE — Care Management (Signed)
Admitted for abdominal pain.  Colonoscopy and EGD today.  PCP Princella Ion.  Following for discharge medication needs

## 2016-03-19 NOTE — Progress Notes (Signed)
Patient did not finish golytely; only moderate stool flushed mixed with urine; NPO for upper endo today. Barbaraann Faster, RN 03/19/2016 6:30 AM

## 2016-03-19 NOTE — Anesthesia Preprocedure Evaluation (Signed)
Anesthesia Evaluation  Patient identified by MRN, date of birth, ID band Patient awake    Reviewed: Allergy & Precautions, NPO status , Patient's Chart, lab work & pertinent test results  History of Anesthesia Complications Negative for: history of anesthetic complications  Airway Mallampati: II       Dental  (+) Missing, Partial Upper   Pulmonary Current Smoker,           Cardiovascular hypertension, + Valvular Problems/Murmurs (VSD as a child, now "mostly closed")      Neuro/Psych negative neurological ROS     GI/Hepatic negative GI ROS, Neg liver ROS,   Endo/Other  negative endocrine ROS  Renal/GU negative Renal ROS     Musculoskeletal   Abdominal   Peds  Hematology  (+) anemia ,   Anesthesia Other Findings   Reproductive/Obstetrics                            Anesthesia Physical Anesthesia Plan  ASA: III  Anesthesia Plan: General   Post-op Pain Management:    Induction: Intravenous  Airway Management Planned: Nasal Cannula  Additional Equipment:   Intra-op Plan:   Post-operative Plan:   Informed Consent: I have reviewed the patients History and Physical, chart, labs and discussed the procedure including the risks, benefits and alternatives for the proposed anesthesia with the patient or authorized representative who has indicated his/her understanding and acceptance.     Plan Discussed with:   Anesthesia Plan Comments:         Anesthesia Quick Evaluation

## 2016-03-19 NOTE — Op Note (Signed)
[facility] Patient Name: Sherry Little Procedure Date: 03/19/2016 10:36 AM MRN: [id number] Account #: [id number] Date of Birth: [birth year] Admit Type: Inpatient Age: 45 Room: [facility] ENDO ROOM 4 Gender: Female Note Status: Finalized Procedure:            Colonoscopy Indications:          Iron deficiency anemia Providers:            Lucilla Lame MD, MD Referring MD:         [facility] (Referring MD) Medicines:            Propofol per Anesthesia Complications:        No immediate complications. Procedure:            Pre-Anesthesia Assessment:                       - Prior to the procedure, a History and Physical was                        performed, and patient medications and allergies were                        reviewed. The patient's tolerance of previous                        anesthesia was also reviewed. The risks and benefits of                        the procedure and the sedation options and risks were                        discussed with the patient. All questions were                        answered, and informed consent was obtained. Prior                        Anticoagulants: The patient has taken no previous                        anticoagulant or antiplatelet agents. ASA Grade                        Assessment: II - A patient with mild systemic disease.                        After reviewing the risks and benefits, the patient was                        deemed in satisfactory condition to undergo the                        procedure.                       After obtaining informed consent, the colonoscope was                        passed under direct vision. Throughout the procedure,  the patient's blood pressure, pulse, and oxygen                        saturations were monitored continuously. The                        Colonoscope was introduced through the anus and   advanced to the the cecum, identified by appendiceal                        orifice and ileocecal valve. The colonoscopy was                        performed without difficulty. The patient tolerated the                        procedure well. The quality of the bowel preparation                        was poor. Findings:      The perianal and digital rectal examinations were normal.      Two sessile polyps were found in the sigmoid colon. The polyps were 2 to       3 mm in size. These polyps were removed with a cold snare. Resection and       retrieval were complete.      Non-bleeding internal hemorrhoids were found during retroflexion. The       hemorrhoids were Grade II (internal hemorrhoids that prolapse but reduce       spontaneously). Impression:           - Preparation of the colon was poor.                       - Two 2 to 3 mm polyps in the sigmoid colon, removed                        with a cold snare. Resected and retrieved.                       - Non-bleeding internal hemorrhoids. Recommendation:       - Return patient to hospital ward for ongoing care. Procedure Code(s):    --- Professional ---                       [phone], Colonoscopy, flexible; with removal of tumor(s),                        polyp(s), or other lesion(s) by snare technique Diagnosis Code(s):    --- Professional ---                       D50.9, Iron deficiency anemia, unspecified                       D12.5, Benign neoplasm of sigmoid colon CPT copyright 2016 American Medical Association. All rights reserved. The codes documented in this report are preliminary and upon coder review may  be revised to meet current compliance requirements. Lucilla Lame MD, MD 03/19/2016 11:04:19 AM This report has been signed electronically. Number of Addenda: 0 Note Initiated On: 03/19/2016 10:36 AM Scope Withdrawal Time: 0  hours 9 minutes 56 seconds  Total Procedure Duration: 0 hours 14 minutes 26 seconds       [facility]

## 2016-03-19 NOTE — Op Note (Signed)
[facility] Patient Name: Sherry Little Procedure Date: 03/19/2016 10:37 AM MRN: [id number] Account #: [id number] Date of Birth: 06-05-1970 Admit Type: Inpatient Age: 45 Room: [facility] ENDO ROOM 4 Gender: Female Note Status: Finalized Procedure:            Upper GI endoscopy Indications:          Epigastric abdominal pain, Iron deficiency anemia,                        Weight loss Providers:            Lucilla Lame MD, MD Referring MD:         No Local Md, MD (Referring MD) Medicines:            Propofol per Anesthesia Complications:        No immediate complications. Procedure:            Pre-Anesthesia Assessment:                       - Prior to the procedure, a History and Physical was                        performed, and patient medications and allergies were                        reviewed. The patient's tolerance of previous                        anesthesia was also reviewed. The risks and benefits of                        the procedure and the sedation options and risks were                        discussed with the patient. All questions were                        answered, and informed consent was obtained. Prior                        Anticoagulants: The patient has taken no previous                        anticoagulant or antiplatelet agents. ASA Grade                        Assessment: II - A patient with mild systemic disease.                        After reviewing the risks and benefits, the patient was                        deemed in satisfactory condition to undergo the                        procedure.                       After obtaining informed consent, the endoscope was  passed under direct vision. Throughout the procedure,                        the patient's blood pressure, pulse, and oxygen                        saturations were monitored continuously. The Endoscope                        was  introduced through the mouth, and advanced to the                        second part of duodenum. The upper GI endoscopy was                        accomplished without difficulty. The patient tolerated                        the procedure well. Findings:      A small hiatal hernia was present.      Localized severe inflammation characterized by erosions and shallow       ulcerations was found in the gastric antrum.      The examined duodenum was normal. Impression:           - Small hiatal hernia.                       - Gastritis.                       - Normal examined duodenum.                       - No specimens collected. Recommendation:       - Perform a colonoscopy today. Procedure Code(s):    --- Professional ---                       [phone], Esophagogastroduodenoscopy, flexible, transoral;                        diagnostic, including collection of specimen(s) by                        brushing or washing, when performed (separate procedure) Diagnosis Code(s):    --- Professional ---                       R63.4, Abnormal weight loss                       D50.9, Iron deficiency anemia, unspecified                       R10.13, Epigastric pain                       K29.70, Gastritis, unspecified, without bleeding CPT copyright 2016 American Medical Association. All rights reserved. The codes documented in this report are preliminary and upon coder review may  be revised to meet current compliance requirements. Lucilla Lame MD, MD 03/19/2016 10:47:28 AM This report has been signed electronically. Number of Addenda: 0 Note Initiated On: 03/19/2016 10:37 AM  [facility]

## 2016-03-19 NOTE — Progress Notes (Signed)
Patient refused to drink last half of golytly until her bowels start to move; suppository given per patient request/MD order; Barbaraann Faster, RN2:27 AM 03/19/2016

## 2016-03-19 NOTE — Anesthesia Postprocedure Evaluation (Signed)
Anesthesia Post Note  Patient: Sherry Little  Procedure(s) Performed: Procedure(s) (LRB): ESOPHAGOGASTRODUODENOSCOPY (EGD) WITH PROPOFOL (N/A) COLONOSCOPY WITH PROPOFOL (N/A)  Patient location during evaluation: Endoscopy Anesthesia Type: General Level of consciousness: awake and alert Pain management: pain level controlled Vital Signs Assessment: post-procedure vital signs reviewed and stable Respiratory status: spontaneous breathing and respiratory function stable Cardiovascular status: stable Anesthetic complications: no    Last Vitals:  Vitals:   03/19/16 0803 03/19/16 1017  BP: (!) 145/62 (!) 163/78  Pulse: (!) 51 (!) 45  Resp: 18 17  Temp:  36.1 C    Last Pain:  Vitals:   03/19/16 1017  TempSrc: Tympanic  PainSc:                  KEPHART,WILLIAM K

## 2016-03-19 NOTE — Consult Note (Signed)
[facility]  Date of admission:  03/17/2016  Inpatient day:  03/18/2016  Consulting physician: Dr. Fulton Reek  Reason for Consultation:  Anemia  Chief Complaint: Sherry Little is a 45 y.o. female who was admitted through the emergency room with epigastric pain and a severe microcytic anemia.  HPI:  The patient states a history of mild anemia for a number of years. Previously she was on oral iron. She has not been on oral iron in a while.  Regarding her diet, she eats meat daily. She also eats green leafy vegetables. She has craved starches for years. She denies any ice pica.  She denies any melena or hematochezia.  She notes that her heavy menses.  Menses occur every month and last 4-5 days. She will typically go through 72 pads a month secondary to heavy flow. She just completed her menses last week.  She denies any excess bruising.  The patient notes severe epigastric pain for the past 2-3 days. She denies any nausea, vomiting, diarrhea. She denies any melena or hematochezia. She notes a history of gastritis. She has never had a transfusion prior to this admission.  She notes a 2 month history of unexplained weight loss. She has lost about 15 pounds. She is eating normally.  CBC 6 years ago revealed a hematocrit of 31.1, hemoglobin 10 and MCV 77.2.  CBC on admission included a hematocrit 23.6, hemoglobin 7, MCV 66.3, platelets 509,000, white count 6900.  Ferritin was 2.  Iron studies included a saturation of 2% and a TIBC of 503.  B12 was 717.  Reticulocyte count was 2.2% (low for level of anemia).  Coombs was negative.   Past Medical History:  Diagnosis Date  . Heart murmur   . Hypertension   . VSD (ventricular septal defect and aortic arch hypoplasia     Past Surgical History:  Procedure Laterality Date  . GANGLION CYST EXCISION  2008   left wrist  . HAND SURGERY     cyst romved from left thumb  . TUBAL LIGATION      History reviewed. No pertinent  family history.  Social History:  reports that she has been smoking Cigarettes.  She has been smoking about 0.50 packs per day. She has never used smokeless tobacco. She reports that she drinks alcohol. She reports that she uses drugs, including Marijuana.  The patient is alone today.  Allergies:  Allergies  Allergen Reactions  . Bee Venom Swelling  . Mushroom Ext Cmplx(Shiitake-Reishi-Mait) Itching and Swelling    All mushrooms   . Mobic [Meloxicam] Rash  . Tramadol Rash    Medications Prior to Admission  Medication Sig Dispense Refill  . albuterol (PROVENTIL HFA;VENTOLIN HFA) 108 (90 Base) MCG/ACT inhaler Inhale 2 puffs into the lungs every 6 (six) hours as needed for wheezing or shortness of breath. 1 Inhaler 0  . albuterol (PROVENTIL HFA;VENTOLIN HFA) 108 (90 Base) MCG/ACT inhaler Inhale 2 puffs into the lungs every 6 (six) hours as needed for wheezing or shortness of breath. 1 Inhaler 0  . ALPRAZolam (XANAX) 0.5 MG tablet Take 1 tablet (0.5 mg total) by mouth at bedtime as needed for anxiety. 10 tablet 0  . ALPRAZolam (XANAX) 1 MG tablet Take 1 mg by mouth 3 (three) times daily.    . diazepam (VALIUM) 2 MG tablet Take 1 tablet (2 mg total) by mouth every 8 (eight) hours as needed for muscle spasms. 15 tablet 0  . diphenhydrAMINE (BENADRYL) 25 MG tablet Take  1 tablet (25 mg total) by mouth every 6 (six) hours as needed for itching (Rash). 30 tablet 0  . fluticasone (FLONASE) 50 MCG/ACT nasal spray Place 2 sprays into both nostrils daily. 16 g 0  . mirtazapine (REMERON) 30 MG tablet Take 30 mg by mouth at bedtime.    Marland Kitchen OLANZapine (ZYPREXA) 20 MG tablet Take 20 mg by mouth at bedtime.      Review of Systems: GENERAL:  Fatigue.  No fevers or sweats.  Weight loss of 15 pounds in 2 months (unexplained). PERFORMANCE STATUS (ECOG):  1 HEENT:  No visual changes, runny nose, sore throat, mouth sores or tenderness. Lungs: Shortness of breath x 1 month.  No cough.  No hemoptysis. Cardiac:   No chest pain, palpitations, orthopnea, or PND. GI:  Epigastric pain.  No nausea, vomiting, diarrhea, constipation, melena or hematochezia. GU:  No urgency, frequency, dysuria, or hematuria.  Heavy menses. Musculoskeletal:  No back pain.  No joint pain.  No muscle tenderness. Extremities:  No pain or swelling. Skin:  No rashes or skin changes. Neuro:  No headache, numbness or weakness, balance or coordination issues. Endocrine:  No diabetes, thyroid issues, hot flashes or night sweats. Psych:  No mood changes, depression or anxiety. Pain:  No focal pain. Review of systems:  All other systems reviewed and found to be negative.  Physical Exam:  Blood pressure (!) 141/65, pulse 60, temperature 98.3 F (36.8 C), temperature source Oral, resp. rate 20, height 5' 2"  (1.575 m), weight 116 lb (52.6 kg), last menstrual period 03/11/2016, SpO2 99 %.  GENERAL:  Thin woman lying comfortably on the medical unit in no acute distress. MENTAL STATUS:  Alert and oriented to person, place and time. HEAD:  Braided brown hair.  Normocephalic, atraumatic, face symmetric, no Cushingoid features. EYES:  Brown eyes.  Pupils equal round and reactive to light and accomodation.  No conjunctivitis or scleral icterus. ENT:  Oropharynx clear without lesion.  Tongue normal. Mucous membranes moist.  RESPIRATORY:  Clear to auscultation without rales, wheezes or rhonchi. CARDIOVASCULAR:  Regular rate and rhythm without murmur, rub or gallop. ABDOMEN:  Soft, tender in the epigastric region without guarding or rebound tenderness.  Active bowel sounds and no hepatosplenomegaly.  Hard mobile left sided suprapubic mass. SKIN:  No rashes, ulcers or lesions. EXTREMITIES: No edema, no skin discoloration or tenderness.  No palpable cords. LYMPH NODES: No palpable cervical, supraclavicular, axillary or inguinal adenopathy  NEUROLOGICAL: Unremarkable. PSYCH:  Appropriate.   Results for orders placed or performed during the  hospital encounter of 03/17/16 (from the past 48 hour(s))  Lipase, blood     Status: None   Collection Time: 03/17/16  2:18 PM  Result Value Ref Range   Lipase 26 11 - 51 U/L  Comprehensive metabolic panel     Status: Abnormal   Collection Time: 03/17/16  2:18 PM  Result Value Ref Range   Sodium 136 135 - 145 mmol/L   Potassium 3.7 3.5 - 5.1 mmol/L   Chloride 109 101 - 111 mmol/L   CO2 22 22 - 32 mmol/L   Glucose, Bld 90 65 - 99 mg/dL   BUN 8 6 - 20 mg/dL   Creatinine, Ser 0.57 0.44 - 1.00 mg/dL   Calcium 9.5 8.9 - 10.3 mg/dL   Total Protein 7.4 6.5 - 8.1 g/dL   Albumin 3.9 3.5 - 5.0 g/dL   AST 22 15 - 41 U/L   ALT 12 (L) 14 - 54 U/L  Alkaline Phosphatase 93 38 - 126 U/L   Total Bilirubin 0.6 0.3 - 1.2 mg/dL   GFR calc non Af Amer >60 >60 mL/min   GFR calc Af Amer >60 >60 mL/min    Comment: (NOTE) The eGFR has been calculated using the CKD EPI equation. This calculation has not been validated in all clinical situations. eGFR's persistently <60 mL/min signify possible Chronic Kidney Disease.    Anion gap 5 5 - 15  CBC     Status: Abnormal   Collection Time: 03/17/16  2:18 PM  Result Value Ref Range   WBC 6.9 3.6 - 11.0 K/uL   RBC 3.56 (L) 3.80 - 5.20 MIL/uL   Hemoglobin 7.0 (L) 12.0 - 16.0 g/dL   HCT 23.6 (L) 35.0 - 47.0 %   MCV 66.3 (L) 80.0 - 100.0 fL   MCH 19.6 (L) 26.0 - 34.0 pg   MCHC 29.6 (L) 32.0 - 36.0 g/dL   RDW 24.1 (H) 11.5 - 14.5 %   Platelets 509 (H) 150 - 440 K/uL  Iron and TIBC     Status: Abnormal   Collection Time: 03/17/16  2:18 PM  Result Value Ref Range   Iron 10 (L) 28 - 170 ug/dL   TIBC 503 (H) 250 - 450 ug/dL   Saturation Ratios 2 (L) 10.4 - 31.8 %   UIBC 493 ug/dL  Ferritin     Status: Abnormal   Collection Time: 03/17/16  2:18 PM  Result Value Ref Range   Ferritin 2 (L) 11 - 307 ng/mL  Sedimentation rate     Status: Abnormal   Collection Time: 03/17/16  2:18 PM  Result Value Ref Range   Sed Rate 33 (H) 0 - 20 mm/hr  Reticulocytes      Status: Abnormal   Collection Time: 03/17/16  2:18 PM  Result Value Ref Range   Retic Ct Pct 2.2 0.4 - 3.1 %   RBC. 3.52 (L) 3.80 - 5.20 MIL/uL   Retic Count, Manual 77.4 19.0 - 183.0 K/uL  Type and screen Bear     Status: None (Preliminary result)   Collection Time: 03/17/16  3:22 PM  Result Value Ref Range   ISSUE DATE / TIME [id number]    Blood Product Unit Number [id number]    PRODUCT CODE [id number]    Unit Type and Rh 0600    Blood Product Expiration Date 383818403754    Blood Product Unit Number [id number]    Unit Type and Rh 6200    Blood Product Expiration Date 481859093112   Vitamin B12     Status: None   Collection Time: 03/17/16  3:22 PM  Result Value Ref Range   Vitamin B-12 717 180 - 914 pg/mL    Comment: (NOTE) This assay is not validated for testing neonatal or myeloproliferative syndrome specimens for Vitamin B12 levels. Performed at [facility]   Prepare RBC     Status: None   Collection Time: 03/17/16  3:22 PM  Result Value Ref Range   Order Confirmation ORDER PROCESSED BY BLOOD BANK   Prepare RBC     Status: None   Collection Time: 03/17/16  3:22 PM  Result Value Ref Range   Order Confirmation ORDER PROCESSED BY BLOOD BANK   APTT     Status: None   Collection Time: 03/17/16  5:04 PM  Result Value Ref Range   aPTT 33 24 - 36 seconds  Protime-INR     Status: None  Collection Time: 03/17/16  5:04 PM  Result Value Ref Range   Prothrombin Time 14.5 11.4 - 15.2 seconds   INR 1.12   Troponin I     Status: None   Collection Time: 03/17/16  5:04 PM  Result Value Ref Range   Troponin I <0.03 <0.03 ng/mL  Comprehensive metabolic panel     Status: Abnormal   Collection Time: 03/18/16  4:17 AM  Result Value Ref Range   Sodium 137 135 - 145 mmol/L   Potassium 3.7 3.5 - 5.1 mmol/L   Chloride 110 101 - 111 mmol/L   CO2 23 22 - 32 mmol/L   Glucose, Bld 95 65 - 99 mg/dL   BUN 7 6 - 20 mg/dL   Creatinine, Ser 0.56  0.44 - 1.00 mg/dL   Calcium 9.5 8.9 - 10.3 mg/dL   Total Protein 6.8 6.5 - 8.1 g/dL   Albumin 3.7 3.5 - 5.0 g/dL   AST 21 15 - 41 U/L   ALT 11 (L) 14 - 54 U/L   Alkaline Phosphatase 89 38 - 126 U/L   Total Bilirubin 0.6 0.3 - 1.2 mg/dL   GFR calc non Af Amer >60 >60 mL/min   GFR calc Af Amer >60 >60 mL/min    Comment: (NOTE) The eGFR has been calculated using the CKD EPI equation. This calculation has not been validated in all clinical situations. eGFR's persistently <60 mL/min signify possible Chronic Kidney Disease.    Anion gap 4 (L) 5 - 15  CBC     Status: Abnormal   Collection Time: 03/18/16  4:17 AM  Result Value Ref Range   WBC 6.9 3.6 - 11.0 K/uL   RBC 3.52 (L) 3.80 - 5.20 MIL/uL   Hemoglobin 6.9 (L) 12.0 - 16.0 g/dL   HCT 23.4 (L) 35.0 - 47.0 %   MCV 66.5 (L) 80.0 - 100.0 fL   MCH 19.5 (L) 26.0 - 34.0 pg   MCHC 29.3 (L) 32.0 - 36.0 g/dL   RDW 24.0 (H) 11.5 - 14.5 %   Platelets 516 (H) 150 - 440 K/uL  Urinalysis, Complete w Microscopic     Status: Abnormal   Collection Time: 03/18/16 10:04 AM  Result Value Ref Range   Color, Urine STRAW (A) YELLOW   APPearance CLEAR (A) CLEAR   Specific Gravity, Urine 1.004 (L) 1.005 - 1.030   pH 6.0 5.0 - 8.0   Glucose, UA NEGATIVE NEGATIVE mg/dL   Hgb urine dipstick NEGATIVE NEGATIVE   Bilirubin Urine NEGATIVE NEGATIVE   Ketones, ur NEGATIVE NEGATIVE mg/dL   Protein, ur NEGATIVE NEGATIVE mg/dL   Nitrite NEGATIVE NEGATIVE   Leukocytes, UA TRACE (A) NEGATIVE   RBC / HPF NONE SEEN 0 - 5 RBC/hpf   WBC, UA 0-5 0 - 5 WBC/hpf   Bacteria, UA NONE SEEN NONE SEEN   Squamous Epithelial / LPF 0-5 (A) NONE SEEN   Mucous PRESENT   Hemoglobin and Hematocrit     Status: Abnormal   Collection Time: 03/18/16  3:31 PM  Result Value Ref Range   Hemoglobin 6.4 (L) 12.0 - 16.0 g/dL   HCT 21.8 (L) 35.0 - 47.0 %  DAT, polyspecific, AHG ([facility] only)     Status: None   Collection Time: 03/18/16  3:31 PM  Result Value Ref Range   Polyspecific  AHG test NEG   ABO/Rh     Status: None   Collection Time: 03/18/16  3:31 PM  Result Value Ref Range   ABO/RH(D)  A POS    Dg Chest 2 View  Result Date: 03/17/2016 CLINICAL DATA:  Shortness of Breath EXAM: CHEST  2 VIEW COMPARISON:  12/28/2015 FINDINGS: The heart size and mediastinal contours are within normal limits. Both lungs are clear. The visualized skeletal structures are unremarkable. IMPRESSION: No active cardiopulmonary disease. Electronically Signed   By: Inez Catalina M.D.   On: 03/17/2016 16:48    Assessment:  The patient is a 45 y.o. woman with with severe iron deficiency anemia likely secondary to menorrhagia and possible upper GI bleed.  She has a history of gastritis.  Diet is good.  She has a 15 pound weight loss in the past 2 months.  Exam reveals epigastric tenderness and a left sided suprapubic mass.  Plan:   1.  Hematology:  Work-up confirms iron deficiency anemia likely secondary to chronic blood loss.  Patient receiving 2 units of PRBCs today prior to planned EGD and colonoscopy.  Patient also received Venofer 150 mg x 1.  Anticipate continuation of either oral iron or weekly IV iron in outpatient department.  2.  Oncology:  Unexplained 15 pound weight loss in past 2 months.  Diet is good.  No nausea, vomiting or diarrhea.  Check thyroid studies.  Consider imaging of pelvis +/- abdomen given palpable suprapubic mass.  Thank you for allowing me to participate in [place] 's care.  I will follow her closely with you while hospitalized and after discharge in the outpatient department.   Lequita Asal, MD  03/18/2016

## 2016-03-20 LAB — TYPE AND SCREEN
BLOOD PRODUCT EXPIRATION DATE: 201712132359
BLOOD PRODUCT EXPIRATION DATE: 201712192359
ISSUE DATE / TIME: 201712112250
ISSUE DATE / TIME: 201712111820
UNIT TYPE AND RH: 600
Unit Type and Rh: 6200

## 2016-03-20 LAB — SURGICAL PATHOLOGY

## 2016-03-20 NOTE — Discharge Summary (Signed)
Sherry Little, is a 45 y.o. female  DOB [birth year]  MRN [id number].  Admission date:  03/17/2016  Admitting Physician  Idelle Crouch, MD  Discharge Date:  03/19/2016   Primary MD  Avis  Recommendations for primary care physician for things to follow:  Follow-up with primary doctor in 1 week   Admission Diagnosis  Epigastric pain [R10.13] DOE (dyspnea on exertion) [R06.09] Symptomatic anemia [D64.9]   Discharge Diagnosis  Epigastric pain [R10.13] DOE (dyspnea on exertion) [R06.09] Symptomatic anemia [D64.9]    Principal Problem:   Abdominal pain, epigastric Active Problems:   Symptomatic anemia   Valvular heart disease   Bradycardia   Abdominal pain   Portal hypertension (HCC)   Other diseases of stomach and duodenum   Secondary esophageal varices without bleeding (HCC)   Loss of weight   Iron deficiency anemia due to chronic blood loss   Gastritis without bleeding   Polyp of sigmoid colon      Past Medical History:  Diagnosis Date  . Heart murmur   . Hypertension   . VSD (ventricular septal defect and aortic arch hypoplasia     Past Surgical History:  Procedure Laterality Date  . GANGLION CYST EXCISION  2008   left wrist  . HAND SURGERY     cyst romved from left thumb  . TUBAL LIGATION         History of present illness and  Hospital Course:     Kindly see H&P for history of present illness and admission details, please review complete Labs, Consult reports and Test reports for all details in brief  HPI  from the history and physical done on the day of admission 45 year old female patient admitted cause of abdominal pain. Patient has history of VSD, , chronic anemia. Admitted because of epigastric pains and some nausea. Found to have severe anemia with hemoglobin of 7 on  admission.    Hospital Course  : 1.Abdominal cramps nonspecific; likely  Musculoskeletal.  #2 severe symptomatic anemia;. Patient seen by gastroenterology, had EGD, colonoscopy. Patient EGD showed some gastritis but colonoscopy did not show any colon polyps. Patient received 2 units of packed RBC transfusion. Hemoglobin improved to 9.1 from 7.. Patient did have ultrasound of her abdomen including uterus, showed a lot of fibroids. Patient complains of heavy menstrual bleeding.  seen by Nolon Stalls from oncology.  2.severe irondeficiency anemia, ; patient iron level X on admission, TIBC 503. Patient did have ultrasound of  uterus, pelvis which showed multiple fibroids. Patient is received 150 mg of IV Venofer. Discharge her home with ferrous sulfate 325 mg by mouth twice a day. He is to follow with primary doctor, GYN, needs repeat iron levels and possibly need iron injections.  #3 severe malnutrition, seen by dietary, continue nutritional supplements.  #4 depression: Continue home medication.  Discharge Condition: stable   Follow UP  Follow-up Information    Lequita Asal, MD Follow up in 1 week(s).   Specialty:  Hematology and Oncology Contact information: [place] [address] [place] Follow up in 1 week(s).   Specialty:  General Practice Contact information: [place] [address] [phone]             Discharge Instructions  and  Discharge Medications        Medication List    STOP taking these medications  diazepam 2 MG tablet Commonly known as:  VALIUM     TAKE these medications   albuterol 108 (90 Base) MCG/ACT inhaler Commonly known as:  PROVENTIL HFA;VENTOLIN HFA Inhale 2 puffs into the lungs every 6 (six) hours as needed for wheezing or shortness of breath.   albuterol 108 (90 Base) MCG/ACT inhaler Commonly known as:  PROVENTIL HFA;VENTOLIN  HFA Inhale 2 puffs into the lungs every 6 (six) hours as needed for wheezing or shortness of breath.   ALPRAZolam 0.5 MG tablet Commonly known as:  XANAX Take 1 tablet (0.5 mg total) by mouth at bedtime as needed for anxiety. What changed:  Another medication with the same name was removed. Continue taking this medication, and follow the directions you see here.   diphenhydrAMINE 25 MG tablet Commonly known as:  BENADRYL Take 1 tablet (25 mg total) by mouth every 6 (six) hours as needed for itching (Rash).   feeding supplement Liqd Take 1 Container by mouth 3 (three) times daily between meals.   ferrous sulfate 325 (65 FE) MG tablet Commonly known as:  FERROUSUL Take 1 tablet (325 mg total) by mouth daily with breakfast.   fluticasone 50 MCG/ACT nasal spray Commonly known as:  FLONASE Place 2 sprays into both nostrils daily.   mirtazapine 30 MG tablet Commonly known as:  REMERON Take 30 mg by mouth at bedtime.   OLANZapine 20 MG tablet Commonly known as:  ZYPREXA Take 20 mg by mouth at bedtime.         Diet and Activity recommendation: See Discharge Instructions above   Consults obtained;GI,oncology   Major procedures and Radiology Reports - PLEASE review detailed and final reports for all details, in brief -     Dg Chest 2 View  Result Date: 03/17/2016 CLINICAL DATA:  Shortness of Breath EXAM: CHEST  2 VIEW COMPARISON:  12/28/2015 FINDINGS: The heart size and mediastinal contours are within normal limits. Both lungs are clear. The visualized skeletal structures are unremarkable. IMPRESSION: No active cardiopulmonary disease. Electronically Signed   By: Inez Catalina M.D.   On: 03/17/2016 16:48   US Transvaginal Non-ob  Result Date: 03/19/2016 CLINICAL DATA:  Heavy menstrual bleeding for proximally EXAM: TRANSABDOMINAL AND TRANSVAGINAL ULTRASOUND OF PELVIS TECHNIQUE: Both transabdominal and transvaginal ultrasound examinations of the pelvis were performed.  Transabdominal technique was performed for global imaging of the pelvis including uterus, ovaries, adnexal regions, and pelvic cul-de-sac. It was necessary to proceed with endovaginal exam following the transabdominal exam to visualize the endometrium and adnexae in better detail. COMPARISON:  None available FINDINGS: Uterus Measurements: 14 x 7.4 x 9.9 cm. Enlarged nodular heterogeneous appearance related to numerous uterine fibroids. Three dominant fibroids are measured. Right fundal fibroid measures 6 x 4 x 4.3 cm. Left mid uterine fibroid measures 7.8 x 7.0 x 6.6 cm. Left mid lateral fibroid measures 4.0 x 4.2 x 3.4 cm Endometrium Thickness: 18 mm. Limited assessment because of the numerous uterine fibroids. Right ovary Measurements: 4.8 x 1.8 x 3.3 cm. Right ovary is only seen transabdominally. There is a right ovarian hypoechoic cyst with a echogenic debris level measuring 2.9 x 2.5 x 2.6 cm compatible with a hemorrhagic ovarian cyst. Left ovary Not visualized Other findings No abnormal free fluid. IMPRESSION: Enlarged fibroid uterus, 3 dominant uterine fibroids are measured as above largest measuring 7.8 cm in diameter. Overall uterine size is 14 cm in length. Limited assessment of the endometrium because of the fibroids but roughly measuring 18 mm in AP thickness.  2.9 cm hemorrhagic right ovarian cyst suspected Nonvisualization of the left ovary No free fluid Electronically Signed   By: Jerilynn Mages.  Shick M.D.   On: 03/19/2016 16:32   US Pelvis Complete  Result Date: 03/19/2016 CLINICAL DATA:  Heavy menstrual bleeding for proximally EXAM: TRANSABDOMINAL AND TRANSVAGINAL ULTRASOUND OF PELVIS TECHNIQUE: Both transabdominal and transvaginal ultrasound examinations of the pelvis were performed. Transabdominal technique was performed for global imaging of the pelvis including uterus, ovaries, adnexal regions, and pelvic cul-de-sac. It was necessary to proceed with endovaginal exam following the transabdominal exam to  visualize the endometrium and adnexae in better detail. COMPARISON:  None available FINDINGS: Uterus Measurements: 14 x 7.4 x 9.9 cm. Enlarged nodular heterogeneous appearance related to numerous uterine fibroids. Three dominant fibroids are measured. Right fundal fibroid measures 6 x 4 x 4.3 cm. Left mid uterine fibroid measures 7.8 x 7.0 x 6.6 cm. Left mid lateral fibroid measures 4.0 x 4.2 x 3.4 cm Endometrium Thickness: 18 mm. Limited assessment because of the numerous uterine fibroids. Right ovary Measurements: 4.8 x 1.8 x 3.3 cm. Right ovary is only seen transabdominally. There is a right ovarian hypoechoic cyst with a echogenic debris level measuring 2.9 x 2.5 x 2.6 cm compatible with a hemorrhagic ovarian cyst. Left ovary Not visualized Other findings No abnormal free fluid. IMPRESSION: Enlarged fibroid uterus, 3 dominant uterine fibroids are measured as above largest measuring 7.8 cm in diameter. Overall uterine size is 14 cm in length. Limited assessment of the endometrium because of the fibroids but roughly measuring 18 mm in AP thickness. 2.9 cm hemorrhagic right ovarian cyst suspected Nonvisualization of the left ovary No free fluid Electronically Signed   By: Jerilynn Mages.  Shick M.D.   On: 03/19/2016 16:32    Micro Results    No results found for this or any previous visit (from the past 240 hour(s)).     Today   Subjective:   Sherry Little today has no headache,no chest abdominal pain,no new weakness tingling or numbness, feels much better wants to go home today  Objective:   Blood pressure (!) 182/83, pulse (!) 50, temperature 97.5 F (36.4 C), temperature source Oral, resp. rate 20, height 5\' 2"  (1.575 m), weight 54 kg (119 lb 1.6 oz), last menstrual period 03/11/2016, SpO2 100 %.  No intake or output data in the 24 hours ending 03/20/16 1655  Exam Awake Alert, Oriented x 3, No new F.N deficits, Normal affect [place].AT,PERRAL Supple Neck,No JVD, No cervical lymphadenopathy appriciated.   Symmetrical Chest wall movement, Good air movement bilaterally, CTAB RRR,No Gallops,Rubs or new Murmurs, No Parasternal Heave +ve B.Sounds, Abd Soft, Non tender, No organomegaly appriciated, No rebound -guarding or rigidity. No Cyanosis, Clubbing or edema, No new Rash or bruise  Data Review   CBC w Diff:  Lab Results  Component Value Date   WBC 6.9 03/18/2016   HGB 9.1 (L) 03/19/2016   HGB 8.6 (L) 01/02/2014   HCT 28.5 (L) 03/19/2016   HCT 29.0 (L) 01/02/2014   PLT 516 (H) 03/18/2016   PLT 499 (H) 01/02/2014   LYMPHOPCT 9.2 01/02/2014   MONOPCT 4.1 01/02/2014   EOSPCT 0.2 01/02/2014   BASOPCT 0.3 01/02/2014    CMP:  Lab Results  Component Value Date   NA 137 03/18/2016   NA 140 01/02/2014   K 3.7 03/18/2016   K 3.7 01/02/2014   CL 110 03/18/2016   CL 110 (H) 01/02/2014   CO2 23 03/18/2016   CO2 23 01/02/2014  BUN 7 03/18/2016   BUN 8 01/02/2014   CREATININE 0.56 03/18/2016   CREATININE 0.74 01/02/2014   PROT 6.8 03/18/2016   PROT 7.3 01/02/2014   ALBUMIN 3.7 03/18/2016   ALBUMIN 3.6 01/02/2014   BILITOT 0.6 03/18/2016   BILITOT 0.3 01/02/2014   ALKPHOS 89 03/18/2016   ALKPHOS 105 01/02/2014   AST 21 03/18/2016   AST 20 01/02/2014   ALT 11 (L) 03/18/2016   ALT 15 01/02/2014  .   Total Time in preparing paper work, data evaluation and todays exam - 64 minutes  Teryl Gubler M.D on 03/19/2016 at 4:55 PM    Note: This dictation was prepared with Dragon dictation along with smaller phrase technology. Any transcriptional errors that result from this process are unintentional.

## 2016-03-21 ENCOUNTER — Encounter: Payer: Self-pay | Admitting: Gastroenterology

## 2016-03-21 ENCOUNTER — Emergency Department
Admission: EM | Admit: 2016-03-21 | Discharge: 2016-03-21 | Disposition: A | Discharge status: Home / Self Care | Payer: Self-pay | Attending: Emergency Medicine | Admitting: Emergency Medicine

## 2016-03-21 DIAGNOSIS — R1084 Generalized abdominal pain: Secondary | ICD-10-CM | POA: Insufficient documentation

## 2016-03-21 DIAGNOSIS — Z79899 Other long term (current) drug therapy: Secondary | ICD-10-CM | POA: Insufficient documentation

## 2016-03-21 DIAGNOSIS — F17210 Nicotine dependence, cigarettes, uncomplicated: Secondary | ICD-10-CM | POA: Insufficient documentation

## 2016-03-21 DIAGNOSIS — I10 Essential (primary) hypertension: Secondary | ICD-10-CM | POA: Insufficient documentation

## 2016-03-21 LAB — URINALYSIS, ROUTINE W REFLEX MICROSCOPIC
BACTERIA UA: NONE SEEN
BILIRUBIN URINE: NEGATIVE
GLUCOSE, UA: NEGATIVE mg/dL
HGB URINE DIPSTICK: NEGATIVE
Ketones, ur: NEGATIVE mg/dL
NITRITE: NEGATIVE
PROTEIN: NEGATIVE mg/dL
SPECIFIC GRAVITY, URINE: 1.019 (ref 1.005–1.030)
pH: 6 (ref 5.0–8.0)

## 2016-03-21 LAB — COMPREHENSIVE METABOLIC PANEL
ALBUMIN: 3.8 g/dL (ref 3.5–5.0)
ALT: 9 U/L — ABNORMAL LOW (ref 14–54)
ANION GAP: 5 (ref 5–15)
AST: 20 U/L (ref 15–41)
Alkaline Phosphatase: 92 U/L (ref 38–126)
BUN: 6 mg/dL (ref 6–20)
CALCIUM: 9.7 mg/dL (ref 8.9–10.3)
CO2: 23 mmol/L (ref 22–32)
Chloride: 109 mmol/L (ref 101–111)
Creatinine, Ser: 0.61 mg/dL (ref 0.44–1.00)
GFR calc non Af Amer: >60 mL/min (ref >60)
GLUCOSE: 108 mg/dL — AB (ref 65–99)
POTASSIUM: 3.3 mmol/L — AB (ref 3.5–5.1)
SODIUM: 137 mmol/L (ref 135–145)
Total Bilirubin: 0.2 mg/dL — ABNORMAL LOW (ref 0.3–1.2)
Total Protein: 6.9 g/dL (ref 6.5–8.1)

## 2016-03-21 LAB — CBC
HCT: 31.7 % — ABNORMAL LOW (ref 35.0–47.0)
HEMOGLOBIN: 10 g/dL — AB (ref 12.0–16.0)
MCH: 22.8 pg — AB (ref 26.0–34.0)
MCHC: 31.4 g/dL — AB (ref 32.0–36.0)
MCV: 72.7 fL — ABNORMAL LOW (ref 80.0–100.0)
PLATELETS: 478 10*3/uL — AB (ref 150–440)
RBC: 4.36 MIL/uL (ref 3.80–5.20)
RDW: 28.4 % — AB (ref 11.5–14.5)
WBC: 8.4 10*3/uL (ref 3.6–11.0)

## 2016-03-21 LAB — LIPASE, BLOOD: Lipase: 19 U/L (ref 11–51)

## 2016-03-21 LAB — PREGNANCY, URINE: PREG TEST UR: NEGATIVE

## 2016-03-21 MED ORDER — TRAMADOL HCL 50 MG PO TABS: 50.0000 mg | ORAL_TABLET | Freq: Four times a day (QID) | ORAL | 0 refills | Status: DC | PRN

## 2016-03-21 MED ORDER — GI COCKTAIL ~~LOC~~
30.0000 mL | Freq: Once | ORAL | Status: AC
  Administered 2016-03-21: 30 mL via ORAL
  Filled 2016-03-21: qty 30

## 2016-03-21 MED ORDER — HYDROCODONE-ACETAMINOPHEN 5-325 MG PO TABS: 1.0000 | ORAL_TABLET | ORAL | 0 refills | Status: DC | PRN

## 2016-03-21 NOTE — Discharge Instructions (Signed)
Please follow-up with your primary care doctor in the next 1-2 days for recheck/reevaluation. Return to the emergency department for any acute worsening of abdominal pain, fever, or any other symptom personally concerning to yourself.

## 2016-03-21 NOTE — ED Provider Notes (Signed)
[facility] Emergency Department Provider Note  Time seen: 7:32 PM  I have reviewed the triage vital signs and the nursing notes.   HISTORY  Chief Complaint Abdominal Pain    HPI Sherry Little is a 45 y.o. female who presents to the emergency department with abdominal pain. Patient states abdominal pain has been ongoing for multiple months. Patient was admitted to the hospital 1210 through 1212 for abdominal pain and symptomatic anemia. Patient was transfused during her admission. Patient states her abdominal pain was somewhat controlled in the hospital with medication as she was receiving but was told to take Tylenol going home. Patient states the Tylenol has not been helping so she returned to the emergency department for evaluation. Denies any worsening of the abdominal pain. The patient refusing blood work in the emergency department however is now agreeable to have it drawn.  Past Medical History:  Diagnosis Date  . Heart murmur   . Hypertension   . VSD (ventricular septal defect and aortic arch hypoplasia     Patient Active Problem List   Diagnosis Date Noted  . Portal hypertension ([place])   . Other diseases of stomach and duodenum   . Secondary esophageal varices without bleeding ([place])   . Loss of weight   . Iron deficiency anemia due to chronic blood loss   . Gastritis without bleeding   . Polyp of sigmoid colon   . Abdominal pain, epigastric 03/17/2016  . Symptomatic anemia 03/17/2016  . Valvular heart disease 03/17/2016  . Bradycardia 03/17/2016  . Abdominal pain 03/17/2016    Past Surgical History:  Procedure Laterality Date  . COLONOSCOPY WITH PROPOFOL N/A 03/19/2016   Procedure: COLONOSCOPY WITH PROPOFOL;  Surgeon: Lucilla Lame, MD;  Location: [facility] ENDOSCOPY;  Service: Endoscopy;  Laterality: N/A;  . ESOPHAGOGASTRODUODENOSCOPY (EGD) WITH PROPOFOL N/A 03/19/2016   Procedure: ESOPHAGOGASTRODUODENOSCOPY (EGD) WITH PROPOFOL;  Surgeon: Lucilla Lame, MD;  Location: [facility] ENDOSCOPY;  Service: Endoscopy;  Laterality: N/A;  . GANGLION CYST EXCISION  2008   left wrist  . HAND SURGERY     cyst romved from left thumb  . TUBAL LIGATION      Prior to Admission medications   Medication Sig Start Date End Date Taking? Authorizing Provider  albuterol (PROVENTIL HFA;VENTOLIN HFA) 108 (90 Base) MCG/ACT inhaler Inhale 2 puffs into the lungs every 6 (six) hours as needed for wheezing or shortness of breath. 05/22/15   Orbie Pyo, MD  albuterol (PROVENTIL HFA;VENTOLIN HFA) 108 [phone] Base) MCG/ACT inhaler Inhale 2 puffs into the lungs every 6 (six) hours as needed for wheezing or shortness of breath. 12/28/15   Jenise V Bacon Menshew, PA-C  ALPRAZolam (XANAX) 0.5 MG tablet Take 1 tablet (0.5 mg total) by mouth at bedtime as needed for anxiety. 10/20/13   Cleatrice Burke, PA-C  diphenhydrAMINE (BENADRYL) 25 MG tablet Take 1 tablet (25 mg total) by mouth every 6 (six) hours as needed for itching (Rash). 10/20/13   Cleatrice Burke, PA-C  feeding supplement (BOOST / RESOURCE BREEZE) LIQD Take 1 Container by mouth 3 (three) times daily between meals. 03/19/16   Epifanio Lesches, MD  ferrous sulfate (FERROUSUL) 325 (65 FE) MG tablet Take 1 tablet (325 mg total) by mouth daily with breakfast. 03/19/16   Epifanio Lesches, MD  fluticasone (FLONASE) 50 MCG/ACT nasal spray Place 2 sprays into both nostrils daily. 12/28/15   Jenise V Bacon Menshew, PA-C  mirtazapine (REMERON) 30 MG tablet Take 30 mg by mouth at bedtime.  Historical Provider, MD  OLANZapine (ZYPREXA) 20 MG tablet Take 20 mg by mouth at bedtime.    Historical Provider, MD    Allergies  Allergen Reactions  . Bee Venom Swelling  . Mushroom Ext Cmplx(Shiitake-Reishi-Mait) Itching and Swelling    All mushrooms   . Mobic [Meloxicam] Rash  . Tramadol Rash    History reviewed. No pertinent family history.  Social History Social History  Substance Use Topics  . Smoking status:  Current Every Day Smoker    Packs/day: 0.50    Types: Cigarettes  . Smokeless tobacco: Never Used  . Alcohol use No    Review of Systems Constitutional: Negative for fever. Cardiovascular: Negative for chest pain. Respiratory: Negative for shortness of breath. Gastrointestinal: Positive for diffuse abdominal pain. Negative for vomiting or diarrhea. Genitourinary: Negative for dysuria. Neurological: Negative for headache 10-point ROS otherwise negative.  ____________________________________________   PHYSICAL EXAM:  VITAL SIGNS: ED Triage Vitals  Enc Vitals Group     BP 03/21/16 1856 137/69     Pulse Rate 03/21/16 1856 (!) 47     Resp 03/21/16 1856 16     Temp 03/21/16 1856 97.9 F (36.6 C)     Temp Source 03/21/16 1856 Oral     SpO2 03/21/16 1856 96 %     Weight 03/21/16 1856 116 lb (52.6 kg)     Height 03/21/16 1856 5\' 2"  (1.575 m)     Head Circumference --      Peak Flow --      Pain Score 03/21/16 1857 10     Pain Loc --      Pain Edu? --      Excl. in [place]? --     Constitutional: Alert and oriented. Well appearing and in no distress. Eyes: Normal exam ENT   Head: Normocephalic and atraumatic.   Mouth/Throat: Mucous membranes are moist. Cardiovascular: Normal rate, regular rhythm. No murmur Respiratory: Normal respiratory effort without tachypnea nor retractions. Breath sounds are clear  Gastrointestinal: Soft, mild diffuse abdominal tenderness to palpation. Musculoskeletal: Nontender with normal range of motion in all extremities.  Neurologic:  Normal speech and language. No gross focal neurologic deficits  Skin:  Skin is warm, dry and intact.  Psychiatric: Mood and affect are normal.   ____________________________________________   INITIAL IMPRESSION / ASSESSMENT AND PLAN / ED COURSE  Pertinent labs & imaging results that were available during my care of the patient were reviewed by me and considered in my medical decision making (see chart for  details).  Patient presents to the emergency department with continued abdominal pain. Patient was recently admitted for anemia and continued abdominal pain. Found to have fibroids on ultrasound. No definitive source for the patient's abdominal pain found. Patient states she has gastric ulcers, had a colonoscopy and endoscopy performed during her last admission. Anemia was thought to be due to iron deficiency. Patient has been evaluated by oncology for this.  Overall the patient appears very well, mild diffuse abdominal tenderness to palpation. No rebound or guarding. No distention. We will recheck labs, and closely monitor in the emergency department.  Patient's workup is largely unchanged. Hemoglobin is stable. We will discharge the patient on a short course of Ultram, she has a follow-up appointment with Princella Ion. I provided return precautions for any worsening pain.  ____________________________________________   FINAL CLINICAL IMPRESSION(S) / ED DIAGNOSES  Abdominal pain    Harvest Dark, MD 03/21/16 2302

## 2016-03-21 NOTE — ED Triage Notes (Signed)
Pt states hx of ulcers and hernia.  Pt refusing blood work in triage.

## 2016-03-21 NOTE — ED Triage Notes (Signed)
Pt to ED c/o generalized abd pain that is cramping x5-6 days.  States here 2 days ago with the same and given medication while in hospital with relief but no prescriptions.  Denies n/v/d.

## 2016-03-21 NOTE — ED Notes (Signed)
Pt told we needed a urine sample and give specimen cup and wipe. And told to call out to be unhooked from monitor when ready to give sample.

## 2016-06-02 ENCOUNTER — Encounter: Payer: Self-pay | Admitting: Emergency Medicine

## 2016-06-02 ENCOUNTER — Emergency Department
Admission: EM | Admit: 2016-06-02 | Discharge: 2016-06-02 | Disposition: A | Discharge status: Home / Self Care | Payer: Self-pay | Attending: Emergency Medicine | Admitting: Emergency Medicine

## 2016-06-02 DIAGNOSIS — M5441 Lumbago with sciatica, right side: Secondary | ICD-10-CM | POA: Insufficient documentation

## 2016-06-02 DIAGNOSIS — I10 Essential (primary) hypertension: Secondary | ICD-10-CM | POA: Insufficient documentation

## 2016-06-02 DIAGNOSIS — M461 Sacroiliitis, not elsewhere classified: Secondary | ICD-10-CM | POA: Insufficient documentation

## 2016-06-02 DIAGNOSIS — M5431 Sciatica, right side: Secondary | ICD-10-CM

## 2016-06-02 DIAGNOSIS — F17210 Nicotine dependence, cigarettes, uncomplicated: Secondary | ICD-10-CM | POA: Insufficient documentation

## 2016-06-02 DIAGNOSIS — Z79899 Other long term (current) drug therapy: Secondary | ICD-10-CM | POA: Insufficient documentation

## 2016-06-02 MED ORDER — NAPROXEN 500 MG PO TABS: 500.0000 mg | ORAL_TABLET | Freq: Two times a day (BID) | ORAL | 0 refills | Status: DC

## 2016-06-02 MED ORDER — KETOROLAC TROMETHAMINE 60 MG/2ML IM SOLN
30.0000 mg | Freq: Once | INTRAMUSCULAR | Status: AC
  Administered 2016-06-02: 30 mg via INTRAMUSCULAR
  Filled 2016-06-02: qty 2

## 2016-06-02 MED ORDER — CYCLOBENZAPRINE HCL 5 MG PO TABS: 5.0000 mg | ORAL_TABLET | Freq: Three times a day (TID) | ORAL | 0 refills | Status: AC | PRN
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
## 2016-06-02 NOTE — ED Notes (Signed)
NAD noted at time of D/C. Pt denies questions or concerns. Pt ambulatory to the lobby at this time.  
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
## 2016-08-23 ENCOUNTER — Encounter: Payer: Self-pay | Admitting: Emergency Medicine

## 2016-08-23 DIAGNOSIS — R112 Nausea with vomiting, unspecified: Secondary | ICD-10-CM | POA: Insufficient documentation

## 2016-08-23 DIAGNOSIS — F17210 Nicotine dependence, cigarettes, uncomplicated: Secondary | ICD-10-CM | POA: Insufficient documentation

## 2016-08-23 DIAGNOSIS — I10 Essential (primary) hypertension: Secondary | ICD-10-CM | POA: Insufficient documentation

## 2016-08-23 DIAGNOSIS — R197 Diarrhea, unspecified: Secondary | ICD-10-CM | POA: Insufficient documentation

## 2016-08-23 DIAGNOSIS — Z79899 Other long term (current) drug therapy: Secondary | ICD-10-CM | POA: Insufficient documentation

## 2016-08-23 MED ORDER — ONDANSETRON 4 MG PO TBDP
4.0000 mg | ORAL_TABLET | Freq: Once | ORAL | Status: AC | PRN
  Administered 2016-08-24: 4 mg via ORAL
  Filled 2016-08-23: qty 1

## 2016-08-24 ENCOUNTER — Emergency Department
Admission: EM | Admit: 2016-08-24 | Discharge: 2016-08-24 | Disposition: A | Discharge status: Home / Self Care | Payer: Self-pay | Attending: Emergency Medicine | Admitting: Emergency Medicine

## 2016-08-24 DIAGNOSIS — R197 Diarrhea, unspecified: Secondary | ICD-10-CM

## 2016-08-24 DIAGNOSIS — R112 Nausea with vomiting, unspecified: Secondary | ICD-10-CM

## 2016-08-24 LAB — CBC
HEMATOCRIT: 39 % (ref 35.0–47.0)
Hemoglobin: 13.1 g/dL (ref 12.0–16.0)
MCH: 29.4 pg (ref 26.0–34.0)
MCHC: 33.7 g/dL (ref 32.0–36.0)
MCV: 87.2 fL (ref 80.0–100.0)
Platelets: 257 10*3/uL (ref 150–440)
RBC: 4.47 MIL/uL (ref 3.80–5.20)
RDW: 14.9 % — ABNORMAL HIGH (ref 11.5–14.5)
WBC: 10.7 10*3/uL (ref 3.6–11.0)

## 2016-08-24 LAB — COMPREHENSIVE METABOLIC PANEL
ALK PHOS: 93 U/L (ref 38–126)
ALT: 15 U/L (ref 14–54)
ANION GAP: 8 (ref 5–15)
AST: 21 U/L (ref 15–41)
Albumin: 4.3 g/dL (ref 3.5–5.0)
BILIRUBIN TOTAL: 0.7 mg/dL (ref 0.3–1.2)
BUN: 10 mg/dL (ref 6–20)
CALCIUM: 10.3 mg/dL (ref 8.9–10.3)
CO2: 20 mmol/L — ABNORMAL LOW (ref 22–32)
Chloride: 108 mmol/L (ref 101–111)
Creatinine, Ser: 0.74 mg/dL (ref 0.44–1.00)
GFR calc Af Amer: >60 mL/min (ref >60)
Glucose, Bld: 105 mg/dL — ABNORMAL HIGH (ref 65–99)
POTASSIUM: 3.8 mmol/L (ref 3.5–5.1)
Sodium: 136 mmol/L (ref 135–145)
TOTAL PROTEIN: 7.5 g/dL (ref 6.5–8.1)

## 2016-08-24 LAB — LIPASE, BLOOD: Lipase: 24 U/L (ref 11–51)

## 2016-08-24 MED ORDER — ONDANSETRON HCL 4 MG PO TABS: ORAL_TABLET | ORAL | 0 refills | Status: DC

## 2016-08-24 NOTE — Discharge Instructions (Signed)

## 2016-08-24 NOTE — ED Triage Notes (Signed)
Pt comes into the ED via POV c/o N/V/D that started today.  Patient describes constant nausea with no abdominal pain noted at this time.  ^ episodes of emesis since this morning.  Patient denies any chest pain, shortness of breath, or other symptoms.  Patient vomiting in triage room at this time, but in no apparent distress with even and unlabored respirations noted.

## 2016-08-24 NOTE — ED Notes (Signed)
Pt wanting to leave ED at this time. Pt consented to sign out for DC and take DC paperwork.

## 2016-08-24 NOTE — ED Provider Notes (Signed)
[facility] Emergency Department Provider Note  ____________________________________________   First MD Initiated Contact with Patient 08/24/16 0144     (approximate)  I have reviewed the triage vital signs and the nursing notes.   HISTORY  Chief Complaint Nausea and Diarrhea    HPI Sherry Little is a 46 y.o. female with no contributory past medical history presents for evaluation of acute onset vomiting and diarrhea that started early this morning, approximately 18 hours prior to arrival in the emergency department.  She states that she was in her normal state of health last night and when she awoke this morning she was feeling nauseated and began to vomit and have episodes of diarrhea.  She believes she has had at least 5 or 6 episodes of each thrift course the day.  She describes her symptoms as severe and nothing helps them get better or makes them worse.  She denies fever/chills, chest pain, shortness of breath, abdominal pain, dysuria, or increased urinary frequency.  She identifies herself as "gay" and states that there is no possibility she could be pregnant.  Her symptoms have improved significantly after getting his Zofran and triage and she has not vomited over the last few hours.  She has not been traveling recently but states that multiple coworkers at the [employer] at which she works have had the same symptoms recently.   Past Medical History:  Diagnosis Date  . Heart murmur   . Hypertension   . VSD (ventricular septal defect and aortic arch hypoplasia     Patient Active Problem List   Diagnosis Date Noted  . Portal hypertension ([place])   . Other diseases of stomach and duodenum   . Secondary esophageal varices without bleeding ([place])   . Loss of weight   . Iron deficiency anemia due to chronic blood loss   . Gastritis without bleeding   . Polyp of sigmoid colon   . Abdominal pain, epigastric 03/17/2016  . Symptomatic anemia  03/17/2016  . Valvular heart disease 03/17/2016  . Bradycardia 03/17/2016  . Abdominal pain 03/17/2016    Past Surgical History:  Procedure Laterality Date  . COLONOSCOPY WITH PROPOFOL N/A 03/19/2016   Procedure: COLONOSCOPY WITH PROPOFOL;  Surgeon: Lucilla Lame, MD;  Location: [facility] ENDOSCOPY;  Service: Endoscopy;  Laterality: N/A;  . ESOPHAGOGASTRODUODENOSCOPY (EGD) WITH PROPOFOL N/A 03/19/2016   Procedure: ESOPHAGOGASTRODUODENOSCOPY (EGD) WITH PROPOFOL;  Surgeon: Lucilla Lame, MD;  Location: [facility] ENDOSCOPY;  Service: Endoscopy;  Laterality: N/A;  . GANGLION CYST EXCISION  2008   left wrist  . HAND SURGERY     cyst romved from left thumb  . TUBAL LIGATION      Prior to Admission medications   Medication Sig Start Date End Date Taking? Authorizing Provider  albuterol (PROVENTIL HFA;VENTOLIN HFA) 108 (90 Base) MCG/ACT inhaler Inhale 2 puffs into the lungs every 6 (six) hours as needed for wheezing or shortness of breath. 12/28/15   Menshew, Dannielle Karvonen, PA-C  ALPRAZolam (XANAX) 0.5 MG tablet Take 1 tablet (0.5 mg total) by mouth at bedtime as needed for anxiety. Patient taking differently: Take 1 mg by mouth 3 (three) times daily.  10/20/13   Cleatrice Burke, PA-C  diphenhydrAMINE (BENADRYL) 25 MG tablet Take 1 tablet (25 mg total) by mouth every 6 (six) hours as needed for itching (Rash). 10/20/13   Cleatrice Burke, PA-C  feeding supplement (BOOST / RESOURCE BREEZE) LIQD Take 1 Container by mouth 3 (three) times daily between meals. 03/19/16  Epifanio Lesches, MD  ferrous sulfate (FERROUSUL) 325 (65 FE) MG tablet Take 1 tablet (325 mg total) by mouth daily with breakfast. 03/19/16   Epifanio Lesches, MD  fluticasone (FLONASE) 50 MCG/ACT nasal spray Place 2 sprays into both nostrils daily. 12/28/15   Menshew, Dannielle Karvonen, PA-C  HYDROcodone-acetaminophen (NORCO/VICODIN) 5-325 MG tablet Take 1 tablet by mouth every 4 (four) hours as needed. 03/21/16   Harvest Dark, MD    mirtazapine (REMERON) 30 MG tablet Take 30 mg by mouth at bedtime.    [provider]  naproxen (NAPROSYN) 500 MG tablet Take 1 tablet (500 mg total) by mouth 2 (two) times daily with a meal. 06/02/16 06/02/17  Laban Emperor, PA-C  OLANZapine (ZYPREXA) 20 MG tablet Take 20 mg by mouth at bedtime.    [provider]  ondansetron (ZOFRAN) 4 MG tablet Take 1-2 tabs by mouth every 8 hours as needed for nausea/vomiting 08/24/16   Hinda Kehr, MD    Allergies Bee venom; Mushroom ext cmplx(shiitake-reishi-mait); Mobic [meloxicam]; and Tramadol  No family history on file.  Social History Social History  Substance Use Topics  . Smoking status: Current Every Day Smoker    Packs/day: 0.50    Types: Cigarettes  . Smokeless tobacco: Never Used  . Alcohol use No    Review of Systems Constitutional: No fever/chills Eyes: No visual changes. ENT: No sore throat. Cardiovascular: Denies chest pain. Respiratory: Denies shortness of breath. Gastrointestinal: No abdominal pain.  Multiple episodes of vomiting and diarrhea throughout the course of the day. Genitourinary: Negative for dysuria. Musculoskeletal: Negative for neck pain.  Negative for back pain. Integumentary: Negative for rash. Neurological: Negative for headaches, focal weakness or numbness.   ____________________________________________   PHYSICAL EXAM:  VITAL SIGNS: ED Triage Vitals  Enc Vitals Group     BP 08/23/16 2359 (!) 149/101     Pulse Rate 08/23/16 2359 76     Resp 08/23/16 2359 18     Temp 08/23/16 2359 98.7 F (37.1 C)     Temp Source 08/23/16 2359 Oral     SpO2 08/23/16 2359 99 %     Weight 08/23/16 2358 135 lb (61.2 kg)     Height 08/23/16 2358 5\' 2"  (1.575 m)     Head Circumference --      Peak Flow --      Pain Score 08/23/16 2358 2     Pain Loc --      Pain Edu? --      Excl. in [place]? --     Constitutional: Alert and oriented. Well appearing and in no acute distress. Eyes:  Conjunctivae are normal.  Head: Atraumatic. Cardiovascular: Normal rate, regular rhythm. Good peripheral circulation. Grossly normal heart sounds. Respiratory: Normal respiratory effort.  No retractions. Lungs CTAB. Gastrointestinal: Soft and nontender. No distention.  Musculoskeletal: No lower extremity tenderness nor edema. No gross deformities of extremities. Neurologic:  Normal speech and language. No gross focal neurologic deficits are appreciated.  Skin:  Skin is warm, dry and intact. No rash noted. Psychiatric: Mood and affect are normal. Speech and behavior are normal.  ____________________________________________   LABS (all labs ordered are listed, but only abnormal results are displayed)  Labs Reviewed  COMPREHENSIVE METABOLIC PANEL - Abnormal; Notable for the following:       Result Value   CO2 20 (*)    Glucose, Bld 105 (*)    All other components within normal limits  CBC - Abnormal; Notable for the following:  RDW 14.9 (*)    All other components within normal limits  LIPASE, BLOOD  URINALYSIS, COMPLETE (UACMP) WITH MICROSCOPIC   ____________________________________________  EKG  None - EKG not ordered by ED physician ____________________________________________  RADIOLOGY   No results found.  ____________________________________________   PROCEDURES  Critical Care performed: No   Procedure(s) performed:   Procedures   ____________________________________________   INITIAL IMPRESSION / ASSESSMENT AND PLAN / ED COURSE  Pertinent labs & imaging results that were available during my care of the patient were reviewed by me and considered in my medical decision making (see chart for details).  The patient is well-appearing and in no acute distress with normal vital signs and she has not had any vomiting for several hours.  She states she feels better would like to go home.  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  I gave my usual and customary management recommendations and return precautions.   Clinical Course as of Aug 25 214  Sat Aug 24, 2016  0214 Also provided [email or web address] coupon for Zofran.  [CF]    Clinical Course User Index [CF] Hinda Kehr, MD    ____________________________________________  FINAL CLINICAL IMPRESSION(S) / ED DIAGNOSES  Final diagnoses:  Nausea vomiting and diarrhea     MEDICATIONS GIVEN DURING THIS VISIT:  Medications  ondansetron (ZOFRAN-ODT) disintegrating tablet 4 mg (4 mg Oral Given 08/24/16 0002)     NEW OUTPATIENT MEDICATIONS STARTED DURING THIS VISIT:  New Prescriptions   ONDANSETRON (ZOFRAN) 4 MG TABLET    Take 1-2 tabs by mouth every 8 hours as needed for nausea/vomiting    Modified Medications   No medications on file    Discontinued Medications   No medications on file     Note:  This document was prepared using Dragon voice recognition software and may include unintentional dictation errors.    Hinda Kehr, MD 08/24/16 [phone]

## 2016-10-13 ENCOUNTER — Emergency Department
Admission: EM | Admit: 2016-10-13 | Discharge: 2016-10-13 | Disposition: A | Discharge status: Home / Self Care | Payer: Self-pay | Attending: Emergency Medicine | Admitting: Emergency Medicine

## 2016-10-13 DIAGNOSIS — Z5321 Procedure and treatment not carried out due to patient leaving prior to being seen by health care provider: Secondary | ICD-10-CM | POA: Insufficient documentation

## 2016-10-13 DIAGNOSIS — R109 Unspecified abdominal pain: Secondary | ICD-10-CM | POA: Insufficient documentation

## 2016-10-13 LAB — CBC
HCT: 32.2 % — ABNORMAL LOW (ref 35.0–47.0)
Hemoglobin: 11.1 g/dL — ABNORMAL LOW (ref 12.0–16.0)
MCH: 29.9 pg (ref 26.0–34.0)
MCHC: 34.3 g/dL (ref 32.0–36.0)
MCV: 86.9 fL (ref 80.0–100.0)
PLATELETS: 238 10*3/uL (ref 150–440)
RBC: 3.7 MIL/uL — ABNORMAL LOW (ref 3.80–5.20)
RDW: 14.9 % — AB (ref 11.5–14.5)
WBC: 7.6 10*3/uL (ref 3.6–11.0)

## 2016-10-13 LAB — COMPREHENSIVE METABOLIC PANEL
ALK PHOS: 84 U/L (ref 38–126)
ALT: 11 U/L — AB (ref 14–54)
AST: 20 U/L (ref 15–41)
Albumin: 3.7 g/dL (ref 3.5–5.0)
Anion gap: 6 (ref 5–15)
BILIRUBIN TOTAL: 0.4 mg/dL (ref 0.3–1.2)
BUN: 7 mg/dL (ref 6–20)
CO2: 24 mmol/L (ref 22–32)
CREATININE: 0.79 mg/dL (ref 0.44–1.00)
Calcium: 9.9 mg/dL (ref 8.9–10.3)
Chloride: 108 mmol/L (ref 101–111)
GFR calc Af Amer: >60 mL/min (ref >60)
GFR calc non Af Amer: >60 mL/min (ref >60)
Glucose, Bld: 100 mg/dL — ABNORMAL HIGH (ref 65–99)
Potassium: 3.3 mmol/L — ABNORMAL LOW (ref 3.5–5.1)
Sodium: 138 mmol/L (ref 135–145)
TOTAL PROTEIN: 6.8 g/dL (ref 6.5–8.1)

## 2016-10-13 LAB — URINALYSIS, COMPLETE (UACMP) WITH MICROSCOPIC
BILIRUBIN URINE: NEGATIVE
Bacteria, UA: NONE SEEN
Glucose, UA: NEGATIVE mg/dL
HGB URINE DIPSTICK: NEGATIVE
Ketones, ur: NEGATIVE mg/dL
NITRITE: NEGATIVE
PH: 5 (ref 5.0–8.0)
Protein, ur: NEGATIVE mg/dL
SPECIFIC GRAVITY, URINE: 1.016 (ref 1.005–1.030)

## 2016-10-13 LAB — LIPASE, BLOOD: Lipase: 28 U/L (ref 11–51)

## 2016-10-13 NOTE — ED Triage Notes (Signed)
Pt presents to ED via POV with c/o heartburn and right-sided abdominal pain d/t "my hernia that needs pushed back in". Pt reports s/x's have been on-going for 3-4 days. Pt denies CP or SHOB, no fever, no N/V/D. Pt constantly talking on cell phone to s/o during Triage assessment.

## 2016-10-14 ENCOUNTER — Emergency Department (HOSPITAL_COMMUNITY): Admission: EM | Admit: 2016-10-14 | Discharge: 2016-10-14 | Discharge status: Against Medical Advice | Payer: Self-pay

## 2016-10-14 NOTE — ED Notes (Signed)
Called in waiting room for triage, no response

## 2016-10-14 NOTE — ED Notes (Signed)
No answer x 4  

## 2016-10-16 DIAGNOSIS — R1084 Generalized abdominal pain: Secondary | ICD-10-CM | POA: Insufficient documentation

## 2016-10-16 DIAGNOSIS — N83201 Unspecified ovarian cyst, right side: Secondary | ICD-10-CM | POA: Insufficient documentation

## 2016-10-16 DIAGNOSIS — I10 Essential (primary) hypertension: Secondary | ICD-10-CM | POA: Insufficient documentation

## 2016-10-16 DIAGNOSIS — N390 Urinary tract infection, site not specified: Secondary | ICD-10-CM | POA: Insufficient documentation

## 2016-10-16 DIAGNOSIS — N83202 Unspecified ovarian cyst, left side: Secondary | ICD-10-CM | POA: Insufficient documentation

## 2016-10-16 DIAGNOSIS — K5900 Constipation, unspecified: Secondary | ICD-10-CM | POA: Insufficient documentation

## 2016-10-16 DIAGNOSIS — F17210 Nicotine dependence, cigarettes, uncomplicated: Secondary | ICD-10-CM | POA: Insufficient documentation

## 2016-10-16 DIAGNOSIS — D259 Leiomyoma of uterus, unspecified: Secondary | ICD-10-CM | POA: Insufficient documentation

## 2016-10-16 LAB — URINALYSIS, COMPLETE (UACMP) WITH MICROSCOPIC
Bilirubin Urine: NEGATIVE
Glucose, UA: NEGATIVE mg/dL
HGB URINE DIPSTICK: NEGATIVE
Ketones, ur: NEGATIVE mg/dL
Nitrite: NEGATIVE
PROTEIN: NEGATIVE mg/dL
Specific Gravity, Urine: 1.015 (ref 1.005–1.030)
pH: 6 (ref 5.0–8.0)

## 2016-10-16 LAB — COMPREHENSIVE METABOLIC PANEL
ALBUMIN: 3.8 g/dL (ref 3.5–5.0)
ALT: 12 U/L — AB (ref 14–54)
AST: 20 U/L (ref 15–41)
Alkaline Phosphatase: 85 U/L (ref 38–126)
Anion gap: 7 (ref 5–15)
BUN: 15 mg/dL (ref 6–20)
CHLORIDE: 106 mmol/L (ref 101–111)
CO2: 23 mmol/L (ref 22–32)
Calcium: 10.9 mg/dL — ABNORMAL HIGH (ref 8.9–10.3)
Creatinine, Ser: 0.73 mg/dL (ref 0.44–1.00)
GFR calc Af Amer: >60 mL/min (ref >60)
GFR calc non Af Amer: >60 mL/min (ref >60)
GLUCOSE: 107 mg/dL — AB (ref 65–99)
Potassium: 4.2 mmol/L (ref 3.5–5.1)
SODIUM: 136 mmol/L (ref 135–145)
Total Bilirubin: 0.4 mg/dL (ref 0.3–1.2)
Total Protein: 7.1 g/dL (ref 6.5–8.1)

## 2016-10-16 LAB — CBC
HCT: 33.6 % — ABNORMAL LOW (ref 35.0–47.0)
HEMOGLOBIN: 11.5 g/dL — AB (ref 12.0–16.0)
MCH: 29.3 pg (ref 26.0–34.0)
MCHC: 34.1 g/dL (ref 32.0–36.0)
MCV: 86 fL (ref 80.0–100.0)
PLATELETS: 272 10*3/uL (ref 150–440)
RBC: 3.91 MIL/uL (ref 3.80–5.20)
RDW: 15.5 % — ABNORMAL HIGH (ref 11.5–14.5)
WBC: 8.9 10*3/uL (ref 3.6–11.0)

## 2016-10-16 LAB — LIPASE, BLOOD: Lipase: 33 U/L (ref 11–51)

## 2016-10-16 NOTE — ED Triage Notes (Signed)
Pt in with co mid abd pain that started on Saturday states she has a known hernia and feels the same. States was here Saturday but did not wait to be seen.

## 2016-10-17 ENCOUNTER — Encounter: Payer: Self-pay | Admitting: Radiology

## 2016-10-17 ENCOUNTER — Emergency Department: Payer: Self-pay

## 2016-10-17 ENCOUNTER — Emergency Department
Admission: EM | Admit: 2016-10-17 | Discharge: 2016-10-17 | Disposition: A | Discharge status: Home / Self Care | Payer: Self-pay | Attending: Emergency Medicine | Admitting: Emergency Medicine

## 2016-10-17 DIAGNOSIS — R1084 Generalized abdominal pain: Secondary | ICD-10-CM

## 2016-10-17 DIAGNOSIS — K5900 Constipation, unspecified: Secondary | ICD-10-CM

## 2016-10-17 DIAGNOSIS — N83201 Unspecified ovarian cyst, right side: Secondary | ICD-10-CM

## 2016-10-17 DIAGNOSIS — N83202 Unspecified ovarian cyst, left side: Secondary | ICD-10-CM

## 2016-10-17 DIAGNOSIS — D259 Leiomyoma of uterus, unspecified: Secondary | ICD-10-CM

## 2016-10-17 DIAGNOSIS — N390 Urinary tract infection, site not specified: Secondary | ICD-10-CM

## 2016-10-17 LAB — LACTIC ACID, PLASMA: LACTIC ACID, VENOUS: 1.9 mmol/L (ref 0.5–1.9)

## 2016-10-17 MED ORDER — LACTULOSE 10 GM/15ML PO SOLN: 20.0000 g | Freq: Every day | ORAL | 0 refills | Status: DC | PRN

## 2016-10-17 MED ORDER — ONDANSETRON HCL 4 MG/2ML IJ SOLN
4.0000 mg | Freq: Once | INTRAMUSCULAR | Status: AC
  Administered 2016-10-17: 4 mg via INTRAVENOUS
  Filled 2016-10-17: qty 2

## 2016-10-17 MED ORDER — SODIUM CHLORIDE 0.9 % IV BOLUS (SEPSIS)
1000.0000 mL | Freq: Once | INTRAVENOUS | Status: AC
  Administered 2016-10-17: 1000 mL via INTRAVENOUS

## 2016-10-17 MED ORDER — IOPAMIDOL (ISOVUE-300) INJECTION 61%
100.0000 mL | Freq: Once | INTRAVENOUS | Status: AC | PRN
  Administered 2016-10-17: 100 mL via INTRAVENOUS

## 2016-10-17 MED ORDER — DEXTROSE 5 % IV SOLN
1.0000 g | INTRAVENOUS | Status: DC
  Administered 2016-10-17: 1 g via INTRAVENOUS
  Filled 2016-10-17: qty 10

## 2016-10-17 MED ORDER — MORPHINE SULFATE (PF) 4 MG/ML IV SOLN
4.0000 mg | Freq: Once | INTRAVENOUS | Status: AC
  Administered 2016-10-17: 4 mg via INTRAVENOUS
  Filled 2016-10-17: qty 1

## 2016-10-17 MED ORDER — HYDROCODONE-ACETAMINOPHEN 5-325 MG PO TABS: 1.0000 | ORAL_TABLET | Freq: Four times a day (QID) | ORAL | 0 refills | Status: DC | PRN

## 2016-10-17 MED ORDER — IOPAMIDOL (ISOVUE-300) INJECTION 61%
30.0000 mL | Freq: Once | INTRAVENOUS | Status: AC
  Administered 2016-10-17: 30 mL via ORAL

## 2016-10-17 MED ORDER — CEPHALEXIN 500 MG PO CAPS: 500.0000 mg | ORAL_CAPSULE | Freq: Three times a day (TID) | ORAL | 0 refills | Status: DC

## 2016-10-17 NOTE — Discharge Instructions (Signed)
1. Take antibiotic as prescribed (Keflex 500 mg 3 times daily 7 days). 2. Take lactulose as needed for bowel movements. 3. Return to the ER for worsening symptoms, persistent vomiting, difficulty breathing or other concerns.

## 2016-10-17 NOTE — ED Provider Notes (Signed)
[facility] Emergency Department Provider Note   ____________________________________________   First MD Initiated Contact with Patient 10/17/16 0118     (approximate)  I have reviewed the triage vital signs and the nursing notes.   HISTORY  Chief Complaint Abdominal Pain    HPI Sherry Little is a 46 y.o. female who presents to the ED from home with a chief complaint of abdominal pain.Patient reports known ventral hernia and has been experiencing generalized abdominal pain for the past week. Left without being seen over the weekend. Reports generalized abdominal pain not associated with nausea, vomiting, diarrhea or constipation. Denies associated fever, chills, chest pain, shortness of breath, dysuria. Denies recent travel, trauma or recent antibiotic use. Nothing makes her symptoms better or worse.   Past Medical History:  Diagnosis Date  . Heart murmur   . Hypertension   . VSD (ventricular septal defect and aortic arch hypoplasia     Patient Active Problem List   Diagnosis Date Noted  . Portal hypertension ([place])   . Other diseases of stomach and duodenum   . Secondary esophageal varices without bleeding ([place])   . Loss of weight   . Iron deficiency anemia due to chronic blood loss   . Gastritis without bleeding   . Polyp of sigmoid colon   . Abdominal pain, epigastric 03/17/2016  . Symptomatic anemia 03/17/2016  . Valvular heart disease 03/17/2016  . Bradycardia 03/17/2016  . Abdominal pain 03/17/2016    Past Surgical History:  Procedure Laterality Date  . COLONOSCOPY WITH PROPOFOL N/A 03/19/2016   Procedure: COLONOSCOPY WITH PROPOFOL;  Surgeon: Lucilla Lame, MD;  Location: [facility] ENDOSCOPY;  Service: Endoscopy;  Laterality: N/A;  . ESOPHAGOGASTRODUODENOSCOPY (EGD) WITH PROPOFOL N/A 03/19/2016   Procedure: ESOPHAGOGASTRODUODENOSCOPY (EGD) WITH PROPOFOL;  Surgeon: Lucilla Lame, MD;  Location: [facility] ENDOSCOPY;  Service: Endoscopy;  Laterality:  N/A;  . GANGLION CYST EXCISION  2008   left wrist  . HAND SURGERY     cyst romved from left thumb  . TUBAL LIGATION      Prior to Admission medications   Medication Sig Start Date End Date Taking? Authorizing Provider  albuterol (PROVENTIL HFA;VENTOLIN HFA) 108 (90 Base) MCG/ACT inhaler Inhale 2 puffs into the lungs every 6 (six) hours as needed for wheezing or shortness of breath. 12/28/15   Menshew, Dannielle Karvonen, PA-C  ALPRAZolam (XANAX) 0.5 MG tablet Take 1 tablet (0.5 mg total) by mouth at bedtime as needed for anxiety. Patient taking differently: Take 1 mg by mouth 3 (three) times daily.  10/20/13   Cleatrice Burke, PA-C  diphenhydrAMINE (BENADRYL) 25 MG tablet Take 1 tablet (25 mg total) by mouth every 6 (six) hours as needed for itching (Rash). 10/20/13   Cleatrice Burke, PA-C  feeding supplement (BOOST / RESOURCE BREEZE) LIQD Take 1 Container by mouth 3 (three) times daily between meals. 03/19/16   Epifanio Lesches, MD  ferrous sulfate (FERROUSUL) 325 (65 FE) MG tablet Take 1 tablet (325 mg total) by mouth daily with breakfast. 03/19/16   Epifanio Lesches, MD  fluticasone (FLONASE) 50 MCG/ACT nasal spray Place 2 sprays into both nostrils daily. 12/28/15   Menshew, Dannielle Karvonen, PA-C  HYDROcodone-acetaminophen (NORCO/VICODIN) 5-325 MG tablet Take 1 tablet by mouth every 4 (four) hours as needed. 03/21/16   Harvest Dark, MD  mirtazapine (REMERON) 30 MG tablet Take 30 mg by mouth at bedtime.    [provider]  naproxen (NAPROSYN) 500 MG tablet Take 1 tablet (500 mg total)  by mouth 2 (two) times daily with a meal. 06/02/16 06/02/17  Laban Emperor, PA-C  OLANZapine (ZYPREXA) 20 MG tablet Take 20 mg by mouth at bedtime.    [provider]  ondansetron (ZOFRAN) 4 MG tablet Take 1-2 tabs by mouth every 8 hours as needed for nausea/vomiting 08/24/16   Hinda Kehr, MD    Allergies Bee venom; Mushroom ext cmplx(shiitake-reishi-mait); Mobic [meloxicam]; and  Tramadol  No family history on file.  Social History Social History  Substance Use Topics  . Smoking status: Current Every Day Smoker    Packs/day: 0.50    Types: Cigarettes  . Smokeless tobacco: Never Used  . Alcohol use No    Review of Systems  Constitutional: No fever/chills. Eyes: No visual changes. ENT: No sore throat. Cardiovascular: Denies chest pain. Respiratory: Denies shortness of breath. Gastrointestinal: Positive for abdominal pain.  No nausea, no vomiting.  No diarrhea.  No constipation. Genitourinary: Negative for dysuria. Musculoskeletal: Negative for back pain. Skin: Negative for rash. Neurological: Negative for headaches, focal weakness or numbness.   ____________________________________________   PHYSICAL EXAM:  VITAL SIGNS: ED Triage Vitals  Enc Vitals Group     BP 10/17/16 0020 (!) 142/72     Pulse Rate 10/16/16 2328 68     Resp 10/16/16 2328 18     Temp 10/16/16 2328 99.8 F (37.7 C)     Temp Source 10/16/16 2328 Oral     SpO2 10/16/16 2328 98 %     Weight 10/16/16 2328 125 lb (56.7 kg)     Height 10/16/16 2328 5\' 2"  (1.575 m)     Head Circumference --      Peak Flow --      Pain Score 10/16/16 2328 10     Pain Loc --      Pain Edu? --      Excl. in [place]? --     Constitutional: Alert and oriented. Well appearing and in no acute distress. Eyes: Conjunctivae are normal. PERRL. EOMI. Head: Atraumatic. Nose: No congestion/rhinnorhea. Mouth/Throat: Mucous membranes are moist.  Oropharynx non-erythematous. Neck: No stridor.   Cardiovascular: Normal rate, regular rhythm. Grossly normal heart sounds.  Good peripheral circulation. Respiratory: Normal respiratory effort.  No retractions. Lungs CTAB. Gastrointestinal: Soft and mildly tender to palpation diffusely without rebound or guarding. No distention. No abdominal bruits. No CVA tenderness. Musculoskeletal: No lower extremity tenderness nor edema.  No joint effusions. Neurologic:  Normal  speech and language. No gross focal neurologic deficits are appreciated. No gait instability. Skin:  Skin is warm, dry and intact. No rash noted. Psychiatric: Mood and affect are normal. Speech and behavior are normal.  ____________________________________________   LABS (all labs ordered are listed, but only abnormal results are displayed)  Labs Reviewed  CBC - Abnormal; Notable for the following:       Result Value   Hemoglobin 11.5 (*)    HCT 33.6 (*)    RDW 15.5 (*)    All other components within normal limits  COMPREHENSIVE METABOLIC PANEL - Abnormal; Notable for the following:    Glucose, Bld 107 (*)    Calcium 10.9 (*)    ALT 12 (*)    All other components within normal limits  URINALYSIS, COMPLETE (UACMP) WITH MICROSCOPIC - Abnormal; Notable for the following:    Color, Urine YELLOW (*)    APPearance CLEAR (*)    Leukocytes, UA LARGE (*)    Bacteria, UA RARE (*)    Squamous Epithelial / LPF 0-5 (*)  All other components within normal limits  LIPASE, BLOOD  LACTIC ACID, PLASMA  LACTIC ACID, PLASMA  POC URINE PREG, ED   ____________________________________________  EKG  None ____________________________________________  RADIOLOGY  Ct Abdomen Pelvis W Contrast  Result Date: 10/17/2016 CLINICAL DATA:  Mid abdominal pain EXAM: CT ABDOMEN AND PELVIS WITH CONTRAST TECHNIQUE: Multidetector CT imaging of the abdomen and pelvis was performed using the standard protocol following bolus administration of intravenous contrast. CONTRAST:  116mL ISOVUE-300 IOPAMIDOL (ISOVUE-300) INJECTION 61% COMPARISON:  None. FINDINGS: Lower chest: Lung bases demonstrate no acute consolidation or pleural effusion. The heart is nonenlarged. Hepatobiliary: Subcentimeter hypodensities in the liver too small to further characterize. No calcified gallstone or biliary dilatation. Pancreas: Unremarkable. No pancreatic ductal dilatation or surrounding inflammatory changes. Spleen: Nonspecific 6 mm  hypodensity in the anterior spleen. Adrenals/Urinary Tract: Adrenal glands are unremarkable. Kidneys are normal, without renal calculi, focal lesion, or hydronephrosis. Bladder is unremarkable. Stomach/Bowel: Stomach is within normal limits. Appendix not well seen. No right lower quadrant inflammation. Extensive stool in the colon. No evidence of bowel wall thickening, distention, or inflammatory changes. Vascular/Lymphatic: Aortic atherosclerosis. No aneurysmal dilatation. No significantly enlarged lymph nodes. Reproductive: Enlarged uterus with multiple enhancing masses consistent with fibroids. Probable cysts within the bilateral ovaries, measuring 3.1 cm on the right and 3.4 cm on the left. Other: No free air.  No free fluid. Musculoskeletal: No acute or significant osseous findings. IMPRESSION: 1. No CT evidence for acute intra-abdominal or pelvic pathology 2. Probable ovarian cysts. Suggest 6-12 week pelvic ultrasound follow-up. 3. Enlarged uterus with multiple enhancing masses consistent with fibroids. Electronically Signed   By: Donavan Foil M.D.   On: 10/17/2016 03:58    ____________________________________________   PROCEDURES  Procedure(s) performed: None  Procedures  Critical Care performed: No  ____________________________________________   INITIAL IMPRESSION / ASSESSMENT AND PLAN / ED COURSE  Pertinent labs & imaging results that were available during my care of the patient were reviewed by me and considered in my medical decision making (see chart for details).  46 year old female who presents with a one-week history of generalized abdominal pain without associated symptoms. Known ventral hernia per her report. Laboratory and urinalysis results are unremarkable. Will add lactate and proceed to CT abdomen/pelvis to evaluate incarceration. IV Rocephin given for UTI.  Clinical Course as of Oct 17 428  Thu Oct 17, 2016  0428 Patient feeling better and resting in no acute  distress. Updated her of CT imaging results. Will discharge home on antibiotics and she will follow-up with her PCP as well as GYN. Strict return precautions given. Patient verbalizes understanding and agrees with plan of care.  [JS]    Clinical Course User Index [JS] Paulette Blanch, MD     ____________________________________________   FINAL CLINICAL IMPRESSION(S) / ED DIAGNOSES  Final diagnoses:  Generalized abdominal pain  Lower urinary tract infectious disease  Constipation, unspecified constipation type  Uterine leiomyoma, unspecified location  Bilateral ovarian cysts      NEW MEDICATIONS STARTED DURING THIS VISIT:  New Prescriptions   No medications on file     Note:  This document was prepared using Dragon voice recognition software and may include unintentional dictation errors.    Paulette Blanch, MD 10/17/16 [phone]

## 2016-10-17 NOTE — ED Notes (Signed)
Pt states that she has hernia located above her belly button that is giving her a lot of pain. Started hurting a "few days ago". Family at bedside

## 2016-10-28 ENCOUNTER — Emergency Department
Admission: EM | Admit: 2016-10-28 | Discharge: 2016-10-28 | Disposition: A | Discharge status: Home / Self Care | Payer: Self-pay | Attending: Emergency Medicine | Admitting: Emergency Medicine

## 2016-10-28 ENCOUNTER — Encounter: Payer: Self-pay | Admitting: Emergency Medicine

## 2016-10-28 DIAGNOSIS — Z5321 Procedure and treatment not carried out due to patient leaving prior to being seen by health care provider: Secondary | ICD-10-CM | POA: Insufficient documentation

## 2016-10-28 DIAGNOSIS — H53149 Visual discomfort, unspecified: Secondary | ICD-10-CM | POA: Insufficient documentation

## 2016-10-28 DIAGNOSIS — R112 Nausea with vomiting, unspecified: Secondary | ICD-10-CM | POA: Insufficient documentation

## 2016-10-28 DIAGNOSIS — R51 Headache: Secondary | ICD-10-CM | POA: Insufficient documentation

## 2016-10-28 HISTORY — DX: Migraine, unspecified, not intractable, without status migrainosus: G43.909

## 2016-10-28 NOTE — ED Triage Notes (Signed)
Pt reports headache that began last night. Pt reports headache is causing nausea and vomiting and sensitivity to light. Denies fever. Reports history of migraines.

## 2016-10-28 NOTE — ED Notes (Signed)
Pt called in lobby with no response 

## 2016-10-28 NOTE — ED Notes (Signed)
Pt seen getting into car and leaving ER parking lot

## 2016-10-29 ENCOUNTER — Telehealth: Payer: Self-pay | Admitting: Emergency Medicine

## 2016-10-29 NOTE — Telephone Encounter (Signed)
Called patient due to lwot to inquire about condition and follow up plans. Left message.   

## 2016-11-17 ENCOUNTER — Encounter: Payer: Self-pay | Admitting: Emergency Medicine

## 2016-11-17 ENCOUNTER — Emergency Department
Admission: EM | Admit: 2016-11-17 | Discharge: 2016-11-17 | Disposition: A | Discharge status: Home / Self Care | Payer: Self-pay | Attending: Emergency Medicine | Admitting: Emergency Medicine

## 2016-11-17 DIAGNOSIS — K766 Portal hypertension: Secondary | ICD-10-CM | POA: Insufficient documentation

## 2016-11-17 DIAGNOSIS — Z79899 Other long term (current) drug therapy: Secondary | ICD-10-CM | POA: Insufficient documentation

## 2016-11-17 DIAGNOSIS — M5431 Sciatica, right side: Secondary | ICD-10-CM | POA: Insufficient documentation

## 2016-11-17 DIAGNOSIS — F17210 Nicotine dependence, cigarettes, uncomplicated: Secondary | ICD-10-CM | POA: Insufficient documentation

## 2016-11-17 MED ORDER — KETOROLAC TROMETHAMINE 30 MG/ML IJ SOLN
30.0000 mg | Freq: Once | INTRAMUSCULAR | Status: DC
  Filled 2016-11-17: qty 1

## 2016-11-17 MED ORDER — ORPHENADRINE CITRATE 30 MG/ML IJ SOLN
60.0000 mg | INTRAMUSCULAR | Status: AC
  Administered 2016-11-17: 60 mg via INTRAMUSCULAR
  Filled 2016-11-17: qty 2

## 2016-11-17 MED ORDER — KETOROLAC TROMETHAMINE 60 MG/2ML IM SOLN
30.0000 mg | Freq: Once | INTRAMUSCULAR | Status: AC
  Administered 2016-11-17: 30 mg via INTRAMUSCULAR

## 2016-11-17 MED ORDER — CYCLOBENZAPRINE HCL 5 MG PO TABS: 5.0000 mg | ORAL_TABLET | Freq: Three times a day (TID) | ORAL | 0 refills | Status: DC | PRN

## 2016-11-17 MED ORDER — KETOROLAC TROMETHAMINE 10 MG PO TABS: 10.0000 mg | ORAL_TABLET | Freq: Three times a day (TID) | ORAL | 0 refills | Status: DC

## 2016-11-17 NOTE — ED Provider Notes (Signed)
[facility] Emergency Department Provider Note ____________________________________________  Time seen: 1330  I have reviewed the triage vital signs and the nursing notes.  HISTORY  Chief Complaint  Back Pain  HPI Sherry Little is a 46 y.o. female presents to the ED for evaluation of low back pain and right buttocks pain. She describes pain going down the right buttocks and around the lateral right hip. She denies any bladder or bowel incontinence. She denies any recent injury, accident, trauma. She reports the symptoms are aggravated by her prolonged walking and standing at work. She reports previous episodes of sciatic pain, but denies any foot drop, leg weakness, or paresthesias beyond the knee.  Past Medical History:  Diagnosis Date  . Heart murmur   . Hypertension   . Migraine   . VSD (ventricular septal defect and aortic arch hypoplasia     Patient Active Problem List   Diagnosis Date Noted  . Portal hypertension ([place])   . Other diseases of stomach and duodenum   . Secondary esophageal varices without bleeding ([place])   . Loss of weight   . Iron deficiency anemia due to chronic blood loss   . Gastritis without bleeding   . Polyp of sigmoid colon   . Abdominal pain, epigastric 03/17/2016  . Symptomatic anemia 03/17/2016  . Valvular heart disease 03/17/2016  . Bradycardia 03/17/2016  . Abdominal pain 03/17/2016    Past Surgical History:  Procedure Laterality Date  . COLONOSCOPY WITH PROPOFOL N/A 03/19/2016   Procedure: COLONOSCOPY WITH PROPOFOL;  Surgeon: Lucilla Lame, MD;  Location: [facility] ENDOSCOPY;  Service: Endoscopy;  Laterality: N/A;  . ESOPHAGOGASTRODUODENOSCOPY (EGD) WITH PROPOFOL N/A 03/19/2016   Procedure: ESOPHAGOGASTRODUODENOSCOPY (EGD) WITH PROPOFOL;  Surgeon: Lucilla Lame, MD;  Location: [facility] ENDOSCOPY;  Service: Endoscopy;  Laterality: N/A;  . GANGLION CYST EXCISION  2008   left wrist  . HAND SURGERY     cyst romved from left thumb   . TUBAL LIGATION      Prior to Admission medications   Medication Sig Start Date End Date Taking? Authorizing Provider  albuterol (PROVENTIL HFA;VENTOLIN HFA) 108 (90 Base) MCG/ACT inhaler Inhale 2 puffs into the lungs every 6 (six) hours as needed for wheezing or shortness of breath. 12/28/15   Sharry Beining, Dannielle Karvonen, PA-C  ALPRAZolam (XANAX) 0.5 MG tablet Take 1 tablet (0.5 mg total) by mouth at bedtime as needed for anxiety. Patient taking differently: Take 1 mg by mouth 3 (three) times daily.  10/20/13   Cleatrice Burke, PA-C  cephALEXin (KEFLEX) 500 MG capsule Take 1 capsule (500 mg total) by mouth 3 (three) times daily. 10/17/16   Paulette Blanch, MD  cyclobenzaprine (FLEXERIL) 5 MG tablet Take 1 tablet (5 mg total) by mouth 3 (three) times daily as needed for muscle spasms. 11/17/16   Jotham Ahn, Dannielle Karvonen, PA-C  diphenhydrAMINE (BENADRYL) 25 MG tablet Take 1 tablet (25 mg total) by mouth every 6 (six) hours as needed for itching (Rash). 10/20/13   Cleatrice Burke, PA-C  feeding supplement (BOOST / RESOURCE BREEZE) LIQD Take 1 Container by mouth 3 (three) times daily between meals. 03/19/16   Epifanio Lesches, MD  ferrous sulfate (FERROUSUL) 325 (65 FE) MG tablet Take 1 tablet (325 mg total) by mouth daily with breakfast. 03/19/16   Epifanio Lesches, MD  fluticasone (FLONASE) 50 MCG/ACT nasal spray Place 2 sprays into both nostrils daily. 12/28/15   Lorah Kalina, Dannielle Karvonen, PA-C  HYDROcodone-acetaminophen (NORCO) 5-325 MG tablet Take 1 tablet  by mouth every 6 (six) hours as needed for moderate pain. 10/17/16   Paulette Blanch, MD  ketorolac (TORADOL) 10 MG tablet Take 1 tablet (10 mg total) by mouth every 8 (eight) hours. 11/17/16   Dawit Tankard, Dannielle Karvonen, PA-C  lactulose (CHRONULAC) 10 GM/15ML solution Take 30 mLs (20 g total) by mouth daily as needed for mild constipation. 10/17/16   Paulette Blanch, MD  mirtazapine (REMERON) 30 MG tablet Take 30 mg by mouth at bedtime.    [provider]  naproxen (NAPROSYN) 500 MG tablet Take 1 tablet (500 mg total) by mouth 2 (two) times daily with a meal. 06/02/16 06/02/17  Laban Emperor, PA-C  OLANZapine (ZYPREXA) 20 MG tablet Take 20 mg by mouth at bedtime.    [provider]  ondansetron (ZOFRAN) 4 MG tablet Take 1-2 tabs by mouth every 8 hours as needed for nausea/vomiting 08/24/16   Hinda Kehr, MD    Allergies Bee venom; Mushroom ext cmplx(shiitake-reishi-mait); Mobic [meloxicam]; and Tramadol  No family history on file.  Social History Social History  Substance Use Topics  . Smoking status: Current Every Day Smoker    Packs/day: 0.50    Types: Cigarettes  . Smokeless tobacco: Never Used  . Alcohol use No    Review of Systems  Constitutional: Negative for fever. Cardiovascular: Negative for chest pain. Respiratory: Negative for shortness of breath. Gastrointestinal: Negative for abdominal pain, vomiting and diarrhea. Genitourinary: Negative for dysuria. Musculoskeletal: positivefor back and right buttock pain. Neurological: Negative for headaches, focal weakness or numbness. ____________________________________________  PHYSICAL EXAM:  VITAL SIGNS: ED Triage Vitals  Enc Vitals Group     BP 11/17/16 1200 138/88     Pulse Rate 11/17/16 1200 80     Resp 11/17/16 1200 18     Temp 11/17/16 1200 98.2 F (36.8 C)     Temp Source 11/17/16 1200 Oral     SpO2 11/17/16 1200 98 %     Weight 11/17/16 1201 130 lb (59 kg)     Height 11/17/16 1201 5\' 2"  (1.575 m)     Head Circumference --      Peak Flow --      Pain Score 11/17/16 1205 10     Pain Loc --      Pain Edu? --      Excl. in [place]? --     Constitutional: Alert and oriented. Well appearing and in no distress. Cardiovascular: Normal rate, regular rhythm. Normal distal pulses. Respiratory: Normal respiratory effort. No wheezes/rales/rhonchi. Musculoskeletal: normal spinal alignment without midline tenderness, spasm, deformity, or step-off. Patient  transitions from supine to sit without assistance. Negative seated straight leg raise. She is tender to palpation over the lateral gluteal musculature. Nontender with normal range of motion in all extremities.  Neurologic:  Cranial nerves II through XII grossly intact. Normal LE DTRs bilaterally. Antalgic gait without ataxia. Normal speech and language. No gross focal neurologic deficits are appreciated. Skin:  Skin is warm, dry and intact. No rash noted. ____________________________________________  PROCEDURES  Toradol 30 mg IM Norflex 60 mg IM ____________________________________________  INITIAL IMPRESSION / ASSESSMENT AND PLAN / ED COURSE  Patient with ED evaluation of right hip and buttock pain consistent with her history of sciatica. She is improved after ED medication administration. She'll be discharged with a prescription for Flexeril and Toradol.A work note is provided for 2 days as requested.  ____________________________________________  FINAL CLINICAL IMPRESSION(S) / ED DIAGNOSES  Final diagnoses:  Sciatica of right  side     Tallie Hevia, Dannielle Karvonen, PA-C 11/17/16 1625    [address], Dannielle Karvonen, PA-C 11/17/16 1714    Delman Kitten, MD 11/17/16 2043

## 2016-11-17 NOTE — ED Notes (Signed)
See triage note presents with pain to lower back which is moving into right hip area  Ambulates well

## 2016-11-17 NOTE — Discharge Instructions (Signed)
You should take the prescription meds as directed. Follow-up with your provider as needed.

## 2016-11-17 NOTE — ED Triage Notes (Signed)
Pt reports that she is having lower back pain on the right side. Pt states that pain goes down her buttock into her hip. Pt denies any known injury to the area.

## 2016-12-16 ENCOUNTER — Encounter: Payer: Self-pay | Admitting: Emergency Medicine

## 2016-12-16 ENCOUNTER — Emergency Department
Admission: EM | Admit: 2016-12-16 | Discharge: 2016-12-16 | Disposition: A | Discharge status: Home / Self Care | Payer: Self-pay | Attending: Emergency Medicine | Admitting: Emergency Medicine

## 2016-12-16 DIAGNOSIS — Z2089 Contact with and (suspected) exposure to other communicable diseases: Secondary | ICD-10-CM

## 2016-12-16 DIAGNOSIS — Z207 Contact with and (suspected) exposure to pediculosis, acariasis and other infestations: Secondary | ICD-10-CM | POA: Insufficient documentation

## 2016-12-16 DIAGNOSIS — F17210 Nicotine dependence, cigarettes, uncomplicated: Secondary | ICD-10-CM | POA: Insufficient documentation

## 2016-12-16 DIAGNOSIS — W57XXXA Bitten or stung by nonvenomous insect and other nonvenomous arthropods, initial encounter: Secondary | ICD-10-CM | POA: Insufficient documentation

## 2016-12-16 DIAGNOSIS — Y939 Activity, unspecified: Secondary | ICD-10-CM | POA: Insufficient documentation

## 2016-12-16 DIAGNOSIS — I10 Essential (primary) hypertension: Secondary | ICD-10-CM | POA: Insufficient documentation

## 2016-12-16 DIAGNOSIS — L700 Acne vulgaris: Secondary | ICD-10-CM | POA: Insufficient documentation

## 2016-12-16 DIAGNOSIS — Y929 Unspecified place or not applicable: Secondary | ICD-10-CM | POA: Insufficient documentation

## 2016-12-16 DIAGNOSIS — Y998 Other external cause status: Secondary | ICD-10-CM | POA: Insufficient documentation

## 2016-12-16 DIAGNOSIS — Z79899 Other long term (current) drug therapy: Secondary | ICD-10-CM | POA: Insufficient documentation

## 2016-12-16 MED ORDER — PERMETHRIN 5 % EX CREA: 1.0000 "application " | TOPICAL_CREAM | Freq: Once | CUTANEOUS | 0 refills | Status: AC

## 2016-12-16 NOTE — ED Notes (Signed)
Pt reports she was exposed to scabies as well. No itching or bites reported.

## 2016-12-16 NOTE — ED Provider Notes (Signed)
[facility] Emergency Department Provider Note  ____________________________________________  Time seen: Approximately 8:22 PM  I have reviewed the triage vital signs and the nursing notes.   HISTORY  Chief Complaint Insect Bite    HPI Sherry Little is a 46 y.o. female who presents emergency department complaining of October the posterior neck. Patient reports that she was bitten or stung by an insect approximately a month ago. Symptoms have resolved but now she has a bump to the back or neck. She is unsure whether this is related or not. Patient reports area is tender. She denies any fevers or chills, nasal congestion, sore throat, chest pain, abdominal pain, nausea or vomiting.  Patient has also been exposed to scabies from a family member and is requesting scabies treatment at this time.   Past Medical History:  Diagnosis Date  . Heart murmur   . Hypertension   . Migraine   . VSD (ventricular septal defect and aortic arch hypoplasia     Patient Active Problem List   Diagnosis Date Noted  . Portal hypertension ([place])   . Other diseases of stomach and duodenum   . Secondary esophageal varices without bleeding ([place])   . Loss of weight   . Iron deficiency anemia due to chronic blood loss   . Gastritis without bleeding   . Polyp of sigmoid colon   . Abdominal pain, epigastric 03/17/2016  . Symptomatic anemia 03/17/2016  . Valvular heart disease 03/17/2016  . Bradycardia 03/17/2016  . Abdominal pain 03/17/2016    Past Surgical History:  Procedure Laterality Date  . COLONOSCOPY WITH PROPOFOL N/A 03/19/2016   Procedure: COLONOSCOPY WITH PROPOFOL;  Surgeon: Lucilla Lame, MD;  Location: [facility] ENDOSCOPY;  Service: Endoscopy;  Laterality: N/A;  . ESOPHAGOGASTRODUODENOSCOPY (EGD) WITH PROPOFOL N/A 03/19/2016   Procedure: ESOPHAGOGASTRODUODENOSCOPY (EGD) WITH PROPOFOL;  Surgeon: Lucilla Lame, MD;  Location: [facility] ENDOSCOPY;  Service: Endoscopy;  Laterality:  N/A;  . GANGLION CYST EXCISION  2008   left wrist  . HAND SURGERY     cyst romved from left thumb  . TUBAL LIGATION      Prior to Admission medications   Medication Sig Start Date End Date Taking? Authorizing Provider  albuterol (PROVENTIL HFA;VENTOLIN HFA) 108 (90 Base) MCG/ACT inhaler Inhale 2 puffs into the lungs every 6 (six) hours as needed for wheezing or shortness of breath. 12/28/15   Menshew, Dannielle Karvonen, PA-C  ALPRAZolam (XANAX) 0.5 MG tablet Take 1 tablet (0.5 mg total) by mouth at bedtime as needed for anxiety. Patient taking differently: Take 1 mg by mouth 3 (three) times daily.  10/20/13   Cleatrice Burke, PA-C  cephALEXin (KEFLEX) 500 MG capsule Take 1 capsule (500 mg total) by mouth 3 (three) times daily. 10/17/16   Paulette Blanch, MD  cyclobenzaprine (FLEXERIL) 5 MG tablet Take 1 tablet (5 mg total) by mouth 3 (three) times daily as needed for muscle spasms. 11/17/16   Menshew, Dannielle Karvonen, PA-C  diphenhydrAMINE (BENADRYL) 25 MG tablet Take 1 tablet (25 mg total) by mouth every 6 (six) hours as needed for itching (Rash). 10/20/13   Cleatrice Burke, PA-C  feeding supplement (BOOST / RESOURCE BREEZE) LIQD Take 1 Container by mouth 3 (three) times daily between meals. 03/19/16   Epifanio Lesches, MD  ferrous sulfate (FERROUSUL) 325 (65 FE) MG tablet Take 1 tablet (325 mg total) by mouth daily with breakfast. 03/19/16   Epifanio Lesches, MD  fluticasone (FLONASE) 50 MCG/ACT nasal spray Place 2 sprays  into both nostrils daily. 12/28/15   Menshew, Dannielle Karvonen, PA-C  HYDROcodone-acetaminophen (NORCO) 5-325 MG tablet Take 1 tablet by mouth every 6 (six) hours as needed for moderate pain. 10/17/16   Paulette Blanch, MD  ketorolac (TORADOL) 10 MG tablet Take 1 tablet (10 mg total) by mouth every 8 (eight) hours. 11/17/16   Menshew, Dannielle Karvonen, PA-C  lactulose (CHRONULAC) 10 GM/15ML solution Take 30 mLs (20 g total) by mouth daily as needed for mild constipation. 10/17/16   Paulette Blanch, MD  mirtazapine (REMERON) 30 MG tablet Take 30 mg by mouth at bedtime.    [provider]  naproxen (NAPROSYN) 500 MG tablet Take 1 tablet (500 mg total) by mouth 2 (two) times daily with a meal. 06/02/16 06/02/17  Laban Emperor, PA-C  OLANZapine (ZYPREXA) 20 MG tablet Take 20 mg by mouth at bedtime.    [provider]  ondansetron (ZOFRAN) 4 MG tablet Take 1-2 tabs by mouth every 8 hours as needed for nausea/vomiting 08/24/16   Hinda Kehr, MD  permethrin (ELIMITE) 5 % cream Apply 1 application topically once. 12/16/16 12/16/16  Cuthriell, Charline Bills, PA-C    Allergies Bee venom; Mushroom ext cmplx(shiitake-reishi-mait); Mobic [meloxicam]; and Tramadol  History reviewed. No pertinent family history.  Social History Social History  Substance Use Topics  . Smoking status: Current Every Day Smoker    Packs/day: 0.50    Types: Cigarettes  . Smokeless tobacco: Never Used  . Alcohol use No     Review of Systems  Constitutional: No fever/chills Eyes: No visual changes. No discharge ENT: No upper respiratory complaints. Cardiovascular: no chest pain. Respiratory: no cough. No SOB. Gastrointestinal: No abdominal pain.  No nausea, no vomiting.  No diarrhea.  No constipation. Musculoskeletal: Negative for musculoskeletal pain. Skin: positive for lesion to the posterior neck. Exposure to scabies. Neurological: Negative for headaches, focal weakness or numbness. 10-point ROS otherwise negative.  ____________________________________________   PHYSICAL EXAM:  VITAL SIGNS: ED Triage Vitals  Enc Vitals Group     BP 12/16/16 1933 131/78     Pulse Rate 12/16/16 1933 87     Resp 12/16/16 1933 16     Temp --      Temp src --      SpO2 12/16/16 1933 100 %     Weight 12/16/16 1934 130 lb (59 kg)     Height --      Head Circumference --      Peak Flow --      Pain Score 12/16/16 1947 7     Pain Loc --      Pain Edu? --      Excl. in [place]? --       Constitutional: Alert and oriented. Well appearing and in no acute distress. Eyes: Conjunctivae are normal. PERRL. EOMI. Head: Atraumatic. ENT:      Ears:       Nose: No congestion/rhinnorhea.      Mouth/Throat: Mucous membranes are moist.  Neck: No stridor.    Cardiovascular: Normal rate, regular rhythm. Normal S1 and S2.  Good peripheral circulation. Respiratory: Normal respiratory effort without tachypnea or retractions. Lungs CTAB. Good air entry to the bases with no decreased or absent breath sounds. Musculoskeletal: Full range of motion to all extremities. No gross deformities appreciated. Neurologic:  Normal speech and language. No gross focal neurologic deficits are appreciated.  Skin:  Skin is warm, dry and intact. No rash noted. positive for single lesion to  the posterior neck consistent with a blackhead. No sign of surrounding erythema/edema concerning for infection. No rash consistent with scabies is visualized at this time. Psychiatric: Mood and affect are normal. Speech and behavior are normal. Patient exhibits appropriate insight and judgement.   ____________________________________________   LABS (all labs ordered are listed, but only abnormal results are displayed)  Labs Reviewed - No data to display ____________________________________________  EKG   ____________________________________________  RADIOLOGY   No results found.  ____________________________________________    PROCEDURES  Procedure(s) performed:    Procedures    Medications - No data to display   ____________________________________________   INITIAL IMPRESSION / ASSESSMENT AND PLAN / ED COURSE  Pertinent labs & imaging results that were available during my care of the patient were reviewed by me and considered in my medical decision making (see chart for details).  Review of the [place] CSRS was performed in accordance of the [place] prior to dispensing any controlled drugs.      Patient's diagnosis is consistent with Black the posterior neck and the exposure to scabies. No signs of current scabies lesions.. Patient will be discharged home with prescriptions for permethrin. Use exfoliating wash to posterior neck. Patient is to follow up with primary care as needed or otherwise directed. Patient is given ED precautions to return to the ED for any worsening or new symptoms.     ____________________________________________  FINAL CLINICAL IMPRESSION(S) / ED DIAGNOSES  Final diagnoses:  Blackhead  Exposure to scabies      NEW MEDICATIONS STARTED DURING THIS VISIT:  Discharge Medication List as of 12/16/2016  8:31 PM    START taking these medications   Details  permethrin (ELIMITE) 5 % cream Apply 1 application topically once., Starting Mon 12/16/2016, Print            This chart was dictated using voice recognition software/Dragon. Despite best efforts to proofread, errors can occur which can change the meaning. Any change was purely unintentional.    Darletta Moll, PA-C 12/16/16 2043    Nance Pear, MD 12/16/16 2203

## 2016-12-16 NOTE — ED Triage Notes (Signed)
Pt ambulatory to triage with steady gait, no distress noted. Pt c/o possible insect bite to the back of neck x 3 days ago. Pt denies itching to area. On assessment pt has raised area with white center to the right posterior neck.

## 2016-12-16 NOTE — ED Notes (Signed)
Pt states that she was bite about a month ago on her neck. States that she never saw what bit her. She slapped it and it went away. A couple days ago she noticed a knot and a burning feeling in the same area on her neck.

## 2017-01-15 ENCOUNTER — Emergency Department: Payer: Self-pay

## 2017-01-15 ENCOUNTER — Encounter: Payer: Self-pay | Admitting: Emergency Medicine

## 2017-01-15 ENCOUNTER — Emergency Department
Admission: EM | Admit: 2017-01-15 | Discharge: 2017-01-15 | Disposition: A | Discharge status: Home / Self Care | Payer: Self-pay | Attending: Emergency Medicine | Admitting: Emergency Medicine

## 2017-01-15 DIAGNOSIS — S46912A Strain of unspecified muscle, fascia and tendon at shoulder and upper arm level, left arm, initial encounter: Secondary | ICD-10-CM | POA: Insufficient documentation

## 2017-01-15 DIAGNOSIS — X509XXA Other and unspecified overexertion or strenuous movements or postures, initial encounter: Secondary | ICD-10-CM | POA: Insufficient documentation

## 2017-01-15 DIAGNOSIS — M47892 Other spondylosis, cervical region: Secondary | ICD-10-CM | POA: Insufficient documentation

## 2017-01-15 DIAGNOSIS — I10 Essential (primary) hypertension: Secondary | ICD-10-CM | POA: Insufficient documentation

## 2017-01-15 DIAGNOSIS — Z791 Long term (current) use of non-steroidal anti-inflammatories (NSAID): Secondary | ICD-10-CM | POA: Insufficient documentation

## 2017-01-15 DIAGNOSIS — Z79899 Other long term (current) drug therapy: Secondary | ICD-10-CM | POA: Insufficient documentation

## 2017-01-15 DIAGNOSIS — F17210 Nicotine dependence, cigarettes, uncomplicated: Secondary | ICD-10-CM | POA: Insufficient documentation

## 2017-01-15 DIAGNOSIS — Y929 Unspecified place or not applicable: Secondary | ICD-10-CM | POA: Insufficient documentation

## 2017-01-15 DIAGNOSIS — Y999 Unspecified external cause status: Secondary | ICD-10-CM | POA: Insufficient documentation

## 2017-01-15 DIAGNOSIS — Y939 Activity, unspecified: Secondary | ICD-10-CM | POA: Insufficient documentation

## 2017-01-15 MED ORDER — CYCLOBENZAPRINE HCL 10 MG PO TABS
10.0000 mg | ORAL_TABLET | Freq: Once | ORAL | Status: AC
  Administered 2017-01-15: 10 mg via ORAL
  Filled 2017-01-15: qty 1

## 2017-01-15 MED ORDER — CYCLOBENZAPRINE HCL 10 MG PO TABS: 10.0000 mg | ORAL_TABLET | Freq: Three times a day (TID) | ORAL | 0 refills | Status: DC | PRN

## 2017-01-15 MED ORDER — KETOROLAC TROMETHAMINE 10 MG PO TABS: 10.0000 mg | ORAL_TABLET | Freq: Four times a day (QID) | ORAL | 0 refills | Status: DC | PRN

## 2017-01-15 MED ORDER — KETOROLAC TROMETHAMINE 60 MG/2ML IM SOLN
60.0000 mg | Freq: Once | INTRAMUSCULAR | Status: AC
  Administered 2017-01-15: 60 mg via INTRAMUSCULAR
  Filled 2017-01-15: qty 2

## 2017-01-15 NOTE — ED Provider Notes (Signed)
[facility] Emergency Department Provider Note   ____________________________________________   First MD Initiated Contact with Patient 01/15/17 [phone]     (approximate)  I have reviewed the triage vital signs and the nursing notes.   HISTORY  Chief Complaint Shoulder Pain    HPI Sherry Little is a 46 y.o. female patient complain of neck and left shoulder pain is worse in the past week. Patient has a history of chronic neck pain secondary to osteoarthritis. Patient state shoulder pain is a new onset. Patient states pain increase with abduction overhead reaching. Patient posted [facility] joint as a source of pain in the left shoulder. His pain as a 10 over 10. Patient described a pain as "achy". No palliative measures for complaint. Patient is right-hand dominant.   Past Medical History:  Diagnosis Date  . Heart murmur   . Hypertension   . Migraine   . VSD (ventricular septal defect and aortic arch hypoplasia     Patient Active Problem List   Diagnosis Date Noted  . Portal hypertension ([place])   . Other diseases of stomach and duodenum   . Secondary esophageal varices without bleeding ([place])   . Loss of weight   . Iron deficiency anemia due to chronic blood loss   . Gastritis without bleeding   . Polyp of sigmoid colon   . Abdominal pain, epigastric 03/17/2016  . Symptomatic anemia 03/17/2016  . Valvular heart disease 03/17/2016  . Bradycardia 03/17/2016  . Abdominal pain 03/17/2016    Past Surgical History:  Procedure Laterality Date  . COLONOSCOPY WITH PROPOFOL N/A 03/19/2016   Procedure: COLONOSCOPY WITH PROPOFOL;  Surgeon: Lucilla Lame, MD;  Location: [facility] ENDOSCOPY;  Service: Endoscopy;  Laterality: N/A;  . ESOPHAGOGASTRODUODENOSCOPY (EGD) WITH PROPOFOL N/A 03/19/2016   Procedure: ESOPHAGOGASTRODUODENOSCOPY (EGD) WITH PROPOFOL;  Surgeon: Lucilla Lame, MD;  Location: [facility] ENDOSCOPY;  Service: Endoscopy;  Laterality: N/A;  . GANGLION CYST EXCISION   2008   left wrist  . HAND SURGERY     cyst romved from left thumb  . TUBAL LIGATION      Prior to Admission medications   Medication Sig Start Date End Date Taking? Authorizing Provider  albuterol (PROVENTIL HFA;VENTOLIN HFA) 108 (90 Base) MCG/ACT inhaler Inhale 2 puffs into the lungs every 6 (six) hours as needed for wheezing or shortness of breath. 12/28/15   Menshew, Dannielle Karvonen, PA-C  ALPRAZolam (XANAX) 0.5 MG tablet Take 1 tablet (0.5 mg total) by mouth at bedtime as needed for anxiety. Patient taking differently: Take 1 mg by mouth 3 (three) times daily.  10/20/13   Cleatrice Burke, PA-C  cephALEXin (KEFLEX) 500 MG capsule Take 1 capsule (500 mg total) by mouth 3 (three) times daily. 10/17/16   Paulette Blanch, MD  cyclobenzaprine (FLEXERIL) 5 MG tablet Take 1 tablet (5 mg total) by mouth 3 (three) times daily as needed for muscle spasms. 11/17/16   Menshew, Dannielle Karvonen, PA-C  diphenhydrAMINE (BENADRYL) 25 MG tablet Take 1 tablet (25 mg total) by mouth every 6 (six) hours as needed for itching (Rash). 10/20/13   Cleatrice Burke, PA-C  feeding supplement (BOOST / RESOURCE BREEZE) LIQD Take 1 Container by mouth 3 (three) times daily between meals. 03/19/16   Epifanio Lesches, MD  ferrous sulfate (FERROUSUL) 325 (65 FE) MG tablet Take 1 tablet (325 mg total) by mouth daily with breakfast. 03/19/16   Epifanio Lesches, MD  fluticasone (FLONASE) 50 MCG/ACT nasal spray Place 2 sprays into both nostrils  daily. 12/28/15   Menshew, Dannielle Karvonen, PA-C  HYDROcodone-acetaminophen (NORCO) 5-325 MG tablet Take 1 tablet by mouth every 6 (six) hours as needed for moderate pain. 10/17/16   Paulette Blanch, MD  ketorolac (TORADOL) 10 MG tablet Take 1 tablet (10 mg total) by mouth every 8 (eight) hours. 11/17/16   Menshew, Dannielle Karvonen, PA-C  lactulose (CHRONULAC) 10 GM/15ML solution Take 30 mLs (20 g total) by mouth daily as needed for mild constipation. 10/17/16   Paulette Blanch, MD  mirtazapine (REMERON)  30 MG tablet Take 30 mg by mouth at bedtime.    [provider]  naproxen (NAPROSYN) 500 MG tablet Take 1 tablet (500 mg total) by mouth 2 (two) times daily with a meal. 06/02/16 06/02/17  Laban Emperor, PA-C  OLANZapine (ZYPREXA) 20 MG tablet Take 20 mg by mouth at bedtime.    [provider]  ondansetron (ZOFRAN) 4 MG tablet Take 1-2 tabs by mouth every 8 hours as needed for nausea/vomiting 08/24/16   Hinda Kehr, MD    Allergies Bee venom; Mushroom ext cmplx(shiitake-reishi-mait); Mobic [meloxicam]; and Tramadol  No family history on file.  Social History Social History  Substance Use Topics  . Smoking status: Current Every Day Smoker    Packs/day: 0.50    Types: Cigarettes  . Smokeless tobacco: Never Used  . Alcohol use No    Review of Systems  Constitutional: No fever/chills Eyes: No visual changes. ENT: No sore throat. Cardiovascular: Denies chest pain. Respiratory: Denies shortness of breath. Gastrointestinal: No abdominal pain.  No nausea, no vomiting.  No diarrhea.  No constipation. Genitourinary: Negative for dysuria. Musculoskeletal: Neck and left shoulder pain Skin: Negative for rash. Neurological: Negative for headaches, focal weakness or numbness.   ____________________________________________   PHYSICAL EXAM:  VITAL SIGNS: ED Triage Vitals [01/15/17 0939]  Enc Vitals Group     BP (!) 157/87     Pulse Rate 63     Resp 18     Temp 98.7 F (37.1 C)     Temp Source Oral     SpO2 100 %     Weight 130 lb (59 kg)     Height 5\' 5"  (1.651 m)     Head Circumference      Peak Flow      Pain Score 0     Pain Loc      Pain Edu?      Excl. in [place]?     Constitutional: Alert and oriented. Well appearing and in no acute distress.  non-erythematous. Neck: No stridor.   cervical spine tenderness to palpation C3-C5 Hematological/Lymphatic/Immunilogical: No cervical lymphadenopathy. Cardiovascular: Normal rate, regular rhythm. Grossly normal  heart sounds.  Good peripheral circulation. Respiratory: Normal respiratory effort.  No retractions. Lungs CTAB. Gastrointestinal: Soft and nontender. No distention. No abdominal bruits. No CVA tenderness. Musculoskeletal: No obvious deformity to the left shoulder. Moderate guarding palpation GH joint. Decreased range of motion limited by complaining of pain.  Neurologic:  Normal speech and language. No gross focal neurologic deficits are appreciated. No gait instability. Skin:  Skin is warm, dry and intact. No rash noted. Psychiatric: Mood and affect are normal. Speech and behavior are normal.  ____________________________________________   LABS (all labs ordered are listed, but only abnormal results are displayed)  Labs Reviewed - No data to display ____________________________________________  EKG   ____________________________________________  RADIOLOGY  Dg Shoulder Left  Result Date: 01/15/2017 CLINICAL DATA:  Pain in glenohumeral joint with decreased range  of motion. EXAM: LEFT SHOULDER - 2+ VIEW COMPARISON:  None FINDINGS: There is no evidence of fracture or dislocation. There is no evidence of arthropathy or other focal bone abnormality. Soft tissues are unremarkable. IMPRESSION: Negative. Electronically Signed   By: Kerby Moors M.D.   On: 01/15/2017 10:32    __No acute findings on x-ray of the left shoulder. __________________________________________   PROCEDURES  Procedure(s) performed: None  Procedures  Critical Care performed: No  ____________________________________________   INITIAL IMPRESSION / ASSESSMENT AND PLAN / ED COURSE  As part of my medical decision making, I reviewed the following data within the electronic MEDICAL RECORD NUMBER Notes from prior ED visits and [place] Controlled Substance Database   Patient complaining of neck and left shoulder pain for 1 week. Patient has a history of chronic neck pain secondary to changes in the cervical spine.  Discussed negative x-ray findings of the left shoulder. Reviewed CT of the cervical spine taken 2 years ago. Discuss arthritic changes of the cervical spine with patient. Patient given discharge Instructions and a work note. Patient get a prescription for Toradol and Flexeril . Patient advised to follow-up with the child drug [facility] for continual care.      ____________________________________________   FINAL CLINICAL IMPRESSION(S) / ED DIAGNOSES  Final diagnoses:  Strain of left shoulder, initial encounter  Other osteoarthritis of spine, cervical region      NEW MEDICATIONS STARTED DURING THIS VISIT:  New Prescriptions   No medications on file     Note:  This document was prepared using Dragon voice recognition software and may include unintentional dictation errors.    Sable Feil, PA-C 01/15/17 1047    Nena Polio, MD 01/15/17 [phone]

## 2017-01-15 NOTE — ED Triage Notes (Signed)
Left shoulder /neck pain worsen x1 week

## 2017-01-15 NOTE — ED Notes (Signed)
Pt reports left shoulder pain radiating from side of the neck for the past week. Pt states she has been using tylenol and ibuprofen with minimal relief. Pt state the pain is worse when lifting up her arm.

## 2017-05-29 ENCOUNTER — Emergency Department
Admission: EM | Admit: 2017-05-29 | Discharge: 2017-05-29 | Disposition: A | Discharge status: Home / Self Care | Payer: Self-pay | Attending: Emergency Medicine | Admitting: Emergency Medicine

## 2017-05-29 ENCOUNTER — Encounter: Payer: Self-pay | Admitting: Emergency Medicine

## 2017-05-29 DIAGNOSIS — I10 Essential (primary) hypertension: Secondary | ICD-10-CM | POA: Insufficient documentation

## 2017-05-29 DIAGNOSIS — M5431 Sciatica, right side: Secondary | ICD-10-CM | POA: Insufficient documentation

## 2017-05-29 DIAGNOSIS — F17210 Nicotine dependence, cigarettes, uncomplicated: Secondary | ICD-10-CM | POA: Insufficient documentation

## 2017-05-29 DIAGNOSIS — Z79899 Other long term (current) drug therapy: Secondary | ICD-10-CM | POA: Insufficient documentation

## 2017-05-29 MED ORDER — DICLOFENAC SODIUM 75 MG PO TBEC: 75.0000 mg | DELAYED_RELEASE_TABLET | Freq: Two times a day (BID) | ORAL | 1 refills | Status: DC

## 2017-05-29 MED ORDER — CYCLOBENZAPRINE HCL 10 MG PO TABS
10.0000 mg | ORAL_TABLET | Freq: Once | ORAL | Status: AC
  Administered 2017-05-29: 10 mg via ORAL
  Filled 2017-05-29: qty 1

## 2017-05-29 MED ORDER — DICLOFENAC SODIUM 25 MG PO TBEC
50.0000 mg | DELAYED_RELEASE_TABLET | Freq: Once | ORAL | Status: AC
  Administered 2017-05-29: 50 mg via ORAL
  Filled 2017-05-29: qty 2

## 2017-05-29 MED ORDER — CYCLOBENZAPRINE HCL 5 MG PO TABS: 5.0000 mg | ORAL_TABLET | Freq: Three times a day (TID) | ORAL | 0 refills | Status: DC | PRN
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
## 2017-05-29 NOTE — ED Notes (Signed)
See triage note   Presents with right hip/lower back pain a few days ago  States pain radiates into right leg  Denies any recent injury or urinary sxs'

## 2017-05-29 NOTE — Discharge Instructions (Addendum)
Your exam is consistent with sciatic nerve flare. Take the prescription meds as directed. Follow-up with with your provider for continued symptoms.
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
## 2017-05-29 NOTE — ED Notes (Signed)
bp is still elevated - pt states stopped taking her bp meds "a long time ago" and that that is not the reason her bp is high, it's because she is in pain.

## 2017-09-24 ENCOUNTER — Ambulatory Visit: Payer: Self-pay

## 2017-09-25 ENCOUNTER — Ambulatory Visit: Payer: Self-pay

## 2017-09-25 ENCOUNTER — Ambulatory Visit: Payer: Self-pay | Admitting: Adult Health

## 2017-09-25 ENCOUNTER — Telehealth: Payer: Self-pay | Admitting: Pharmacy Technician

## 2017-09-25 NOTE — Telephone Encounter (Signed)
Received updated proof of income.  Patient eligible to receive medication assistance at [facility] through 2019, as long as eligibility requirements continue to be met.  [facility]

## 2017-10-14 ENCOUNTER — Ambulatory Visit: Payer: Self-pay | Admitting: Pharmacy Technician

## 2017-10-14 ENCOUNTER — Encounter (INDEPENDENT_AMBULATORY_CARE_PROVIDER_SITE_OTHER): Payer: Self-pay

## 2017-10-14 DIAGNOSIS — Z79899 Other long term (current) drug therapy: Secondary | ICD-10-CM

## 2017-10-14 NOTE — Progress Notes (Signed)
Patient already completed all eligibility paperwork.  No new documentation needed.  Eligibility appointment scheduled in error.  [facility]

## 2017-10-15 ENCOUNTER — Encounter: Payer: Self-pay | Admitting: Internal Medicine

## 2017-10-15 ENCOUNTER — Ambulatory Visit: Payer: Self-pay | Admitting: Internal Medicine

## 2017-10-15 VITALS — BP 145/85 | HR 70 | Temp 98.1°F | Ht 62.75 in | Wt 131.3 lb

## 2017-10-15 DIAGNOSIS — K0889 Other specified disorders of teeth and supporting structures: Secondary | ICD-10-CM

## 2017-10-15 DIAGNOSIS — Z0000 Encounter for general adult medical examination without abnormal findings: Secondary | ICD-10-CM

## 2017-10-15 NOTE — Progress Notes (Signed)
   Subjective:    Patient ID: Sherry Little, female    DOB: Nov 27, 1970, 47 y.o.   MRN: [id number]  HPI  Patient in for tooth pain; reports taking ibuprofen for pain.    Review of Systems  Patient Active Problem List   Diagnosis Date Noted  . Portal hypertension ([place])   . Other diseases of stomach and duodenum   . Secondary esophageal varices without bleeding ([place])   . Loss of weight   . Iron deficiency anemia due to chronic blood loss   . Gastritis without bleeding   . Polyp of sigmoid colon   . Abdominal pain, epigastric 03/17/2016  . Symptomatic anemia 03/17/2016  . Valvular heart disease 03/17/2016  . Bradycardia 03/17/2016  . Abdominal pain 03/17/2016   Allergies as of 10/15/2017      Reactions   Bee Venom Swelling   Mushroom Ext Cmplx(shiitake-reishi-mait) Itching, Swelling   All mushrooms   Mobic [meloxicam] Rash   Tramadol Rash      Medication List        Accurate as of 10/15/17 11:29 AM. Always use your most recent med list.          cyclobenzaprine 5 MG tablet Commonly known as:  FLEXERIL Take 1 tablet (5 mg total) by mouth 3 (three) times daily as needed for muscle spasms.   diclofenac 75 MG EC tablet Commonly known as:  VOLTAREN Take 1 tablet (75 mg total) by mouth 2 (two) times daily.   feeding supplement Liqd Take 1 Container by mouth 3 (three) times daily between meals.   ferrous sulfate 325 (65 FE) MG tablet Commonly known as:  FERROUSUL Take 1 tablet (325 mg total) by mouth daily with breakfast.   fluticasone 50 MCG/ACT nasal spray Commonly known as:  FLONASE Place 2 sprays into both nostrils daily.   mirtazapine 30 MG tablet Commonly known as:  REMERON Take 30 mg by mouth at bedtime.   OLANZapine 20 MG tablet Commonly known as:  ZYPREXA Take 20 mg by mouth at bedtime.            Objective:   Physical Exam  Constitutional: She is oriented to person, place, and time.  Cardiovascular: Normal rate, regular rhythm and normal heart  sounds.  Pulmonary/Chest: Effort normal and breath sounds normal.  Neurological: She is alert and oriented to person, place, and time.  Vitals reviewed.   BP (!) 145/85 (BP Location: Left Arm, Patient Position: Sitting)   Pulse 70   Temp 98.1 F (36.7 C) (Oral)   Ht 5' 2.75" (1.594 m)   Wt 131 lb 4.8 oz (59.6 kg)   BMI 23.44 kg/m   Appears that left upper incisor seems to be broke      Assessment & Plan:   Refer to dentist for broken tooth with pain; needs urgent attention.   Following up at [facility] for hyperthyroidism.   Routine labs today: Met C, CBC, Lipid, TSH, UA

## 2017-10-16 LAB — COMPREHENSIVE METABOLIC PANEL
A/G RATIO: 1.7 (ref 1.2–2.2)
ALT: 18 IU/L (ref 0–32)
AST: 21 IU/L (ref 0–40)
Albumin: 4.3 g/dL (ref 3.5–5.5)
Alkaline Phosphatase: 106 IU/L (ref 39–117)
BUN/Creatinine Ratio: 14 (ref 9–23)
BUN: 10 mg/dL (ref 6–24)
Bilirubin Total: 0.2 mg/dL (ref 0.0–1.2)
CALCIUM: 10.4 mg/dL — AB (ref 8.7–10.2)
CHLORIDE: 104 mmol/L (ref 96–106)
CO2: 21 mmol/L (ref 20–29)
Creatinine, Ser: 0.7 mg/dL (ref 0.57–1.00)
GFR calc Af Amer: 119 mL/min/{1.73_m2} (ref >59)
GFR calc non Af Amer: 104 mL/min/{1.73_m2} (ref >59)
Globulin, Total: 2.6 g/dL (ref 1.5–4.5)
Glucose: 75 mg/dL (ref 65–99)
POTASSIUM: 5.2 mmol/L (ref 3.5–5.2)
Sodium: 139 mmol/L (ref 134–144)
Total Protein: 6.9 g/dL (ref 6.0–8.5)

## 2017-10-16 LAB — CBC
HEMOGLOBIN: 11.1 g/dL (ref 11.1–15.9)
Hematocrit: 35.1 % (ref 34.0–46.6)
MCH: 26.8 pg (ref 26.6–33.0)
MCHC: 31.6 g/dL (ref 31.5–35.7)
MCV: 85 fL (ref 79–97)
Platelets: 306 10*3/uL (ref 150–450)
RBC: 4.14 x10E6/uL (ref 3.77–5.28)
RDW: 14.6 % (ref 12.3–15.4)
WBC: 8.5 10*3/uL (ref 3.4–10.8)

## 2017-10-16 LAB — TSH: TSH: 0.836 u[IU]/mL (ref 0.450–4.500)

## 2017-10-16 LAB — LIPID PANEL
CHOLESTEROL TOTAL: 227 mg/dL — AB (ref 100–199)
Chol/HDL Ratio: 2.9 ratio (ref 0.0–4.4)
HDL: 77 mg/dL (ref >39)
LDL CALC: 135 mg/dL — AB (ref 0–99)
Triglycerides: 73 mg/dL (ref 0–149)
VLDL CHOLESTEROL CAL: 15 mg/dL (ref 5–40)

## 2017-10-24 ENCOUNTER — Telehealth: Payer: Self-pay

## 2017-10-24 NOTE — Telephone Encounter (Signed)
-----   Message from Marcie Mowers sent at 10/23/2017  5:01 PM EDT ----- Regarding: FW: Last wk labs Can you call patient and make her FU lab appt for Serum protein electrophoresis, PSA for possible multi myeloma evaluation.  The original exam showed mild calcium elevation and mild anemia and he is not sure why. ----- Message ----- From: Tawni Millers, MD Sent: 10/22/2017   8:56 AM To: Marcie Mowers Subject: Last wk labs                                   Mild calcium elevation and mild anemia.  not sure why. Need fu labs. PSA, Serum protein electrophoresis for possible multi myeloma evaluation. Other labs good. ----- Message ----- From: Lurena Nida D Sent: 10/16/2017   8:46 AM To: Tawni Millers, MD    ----- Message ----- From: Lavone Neri Lab Results In Sent: 10/16/2017   7:36 AM To: Bod Results Pool

## 2017-10-24 NOTE — Telephone Encounter (Signed)
Gave pt lab results. Scheduled f/u labs

## 2017-10-28 ENCOUNTER — Other Ambulatory Visit: Payer: Self-pay

## 2017-11-14 ENCOUNTER — Emergency Department: Payer: Self-pay

## 2017-11-14 ENCOUNTER — Other Ambulatory Visit: Payer: Self-pay

## 2017-11-14 ENCOUNTER — Emergency Department
Admission: EM | Admit: 2017-11-14 | Discharge: 2017-11-14 | Disposition: A | Discharge status: Home / Self Care | Payer: Self-pay | Attending: Emergency Medicine | Admitting: Emergency Medicine

## 2017-11-14 DIAGNOSIS — F17210 Nicotine dependence, cigarettes, uncomplicated: Secondary | ICD-10-CM | POA: Insufficient documentation

## 2017-11-14 DIAGNOSIS — F1210 Cannabis abuse, uncomplicated: Secondary | ICD-10-CM | POA: Insufficient documentation

## 2017-11-14 DIAGNOSIS — I10 Essential (primary) hypertension: Secondary | ICD-10-CM | POA: Insufficient documentation

## 2017-11-14 DIAGNOSIS — M67911 Unspecified disorder of synovium and tendon, right shoulder: Secondary | ICD-10-CM | POA: Insufficient documentation

## 2017-11-14 DIAGNOSIS — Z79899 Other long term (current) drug therapy: Secondary | ICD-10-CM | POA: Insufficient documentation

## 2017-11-14 MED ORDER — KETOROLAC TROMETHAMINE 10 MG PO TABS: 10.0000 mg | ORAL_TABLET | Freq: Four times a day (QID) | ORAL | 0 refills | Status: DC | PRN

## 2017-11-14 MED ORDER — LIDOCAINE 5 % EX PTCH
1.0000 | MEDICATED_PATCH | CUTANEOUS | Status: DC
Start: 2017-11-14 — End: 2017-11-14
  Administered 2017-11-14: 1 via TRANSDERMAL
  Filled 2017-11-14: qty 1

## 2017-11-14 MED ORDER — LIDOCAINE 5 % EX PTCH: 1.0000 | MEDICATED_PATCH | CUTANEOUS | 0 refills | Status: DC

## 2017-11-14 MED ORDER — KETOROLAC TROMETHAMINE 30 MG/ML IJ SOLN
30.0000 mg | Freq: Once | INTRAMUSCULAR | Status: AC
  Administered 2017-11-14: 30 mg via INTRAMUSCULAR
  Filled 2017-11-14: qty 1

## 2017-11-14 NOTE — ED Provider Notes (Signed)
[facility] Emergency Department Provider Note  ____________________________________________  Time seen: Approximately 2:08 PM  I have reviewed the triage vital signs and the nursing notes.   HISTORY  Chief Complaint Shoulder Pain    HPI Sherry Little is a 47 y.o. female that presents to the emergency department for evaluation of right shoulder pain for 2 days.  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  Patient states that she works in [employer] and does a lot of repetitive movements with this arm.  Shoulder does not feel weak, just painful. Pain is worse when she tries to lift her arm and when she presses on the top of her arm.  She took an ibuprofen last night.  She is concerned about going to work today and increasing pain.  She has never had problems with her right shoulder before but has injured her rotator cuff muscles on the left.  No shortness of breath, chest pain, radiating pain.   Past Medical History:  Diagnosis Date  . Heart murmur   . Hypertension   . Migraine   . VSD (ventricular septal defect and aortic arch hypoplasia     Patient Active Problem List   Diagnosis Date Noted  . Portal hypertension ([place])   . Other diseases of stomach and duodenum   . Secondary esophageal varices without bleeding ([place])   . Loss of weight   . Iron deficiency anemia due to chronic blood loss   . Gastritis without bleeding   . Polyp of sigmoid colon   . Abdominal pain, epigastric 03/17/2016  . Symptomatic anemia 03/17/2016  . Valvular heart disease 03/17/2016  . Bradycardia 03/17/2016  . Abdominal pain 03/17/2016    Past Surgical History:  Procedure Laterality Date  . COLONOSCOPY WITH PROPOFOL N/A 03/19/2016   Procedure: COLONOSCOPY WITH PROPOFOL;  Surgeon: Lucilla Lame, MD;  Location: [facility] ENDOSCOPY;  Service: Endoscopy;  Laterality: N/A;  . ESOPHAGOGASTRODUODENOSCOPY (EGD) WITH PROPOFOL N/A 03/19/2016   Procedure:  ESOPHAGOGASTRODUODENOSCOPY (EGD) WITH PROPOFOL;  Surgeon: Lucilla Lame, MD;  Location: [facility] ENDOSCOPY;  Service: Endoscopy;  Laterality: N/A;  . GANGLION CYST EXCISION  2008   left wrist  . HAND SURGERY     cyst romved from left thumb  . TUBAL LIGATION      Prior to Admission medications   Medication Sig Start Date End Date Taking? Authorizing Provider  cyclobenzaprine (FLEXERIL) 5 MG tablet Take 1 tablet (5 mg total) by mouth 3 (three) times daily as needed for muscle spasms. 05/29/17   Menshew, Dannielle Karvonen, PA-C  diclofenac (VOLTAREN) 75 MG EC tablet Take 1 tablet (75 mg total) by mouth 2 (two) times daily. Patient not taking: Reported on 10/15/2017 05/29/17   Menshew, Dannielle Karvonen, PA-C  feeding supplement (BOOST / RESOURCE BREEZE) LIQD Take 1 Container by mouth 3 (three) times daily between meals. 03/19/16   Epifanio Lesches, MD  ferrous sulfate (FERROUSUL) 325 (65 FE) MG tablet Take 1 tablet (325 mg total) by mouth daily with breakfast. Patient not taking: Reported on 10/15/2017 03/19/16   Epifanio Lesches, MD  fluticasone (FLONASE) 50 MCG/ACT nasal spray Place 2 sprays into both nostrils daily. 12/28/15   Menshew, Dannielle Karvonen, PA-C  ketorolac (TORADOL) 10 MG tablet Take 1 tablet (10 mg total) by mouth every 6 (six) hours as needed. 11/14/17   Laban Emperor, PA-C  lidocaine (LIDODERM) 5 % Place 1 patch onto the skin daily. Remove & Discard patch within 12 hours  or as directed by MD 11/14/17   Laban Emperor, PA-C  mirtazapine (REMERON) 30 MG tablet Take 30 mg by mouth at bedtime.    [provider]  OLANZapine (ZYPREXA) 20 MG tablet Take 20 mg by mouth at bedtime.    [provider]    Allergies Bee venom; Mushroom ext cmplx(shiitake-reishi-mait); Mobic [meloxicam]; and Tramadol  No family history on file.  Social History Social History   Tobacco Use  . Smoking status: Current Every Day Smoker    Packs/day: 0.50    Types: Cigarettes  . Smokeless  tobacco: Never Used  Substance Use Topics  . Alcohol use: No  . Drug use: Yes    Types: Marijuana    Comment: Last used around thanksgiving     Review of Systems  Constitutional: No fever/chills Cardiovascular: No chest pain. Respiratory: No SOB. Gastrointestinal: No abdominal pain.  No nausea, no vomiting.  Musculoskeletal: Positive for shoulder pain.  Skin: Negative for rash, abrasions, lacerations, ecchymosis. Neurological: Negative for headaches, numbness or tingling   ____________________________________________   PHYSICAL EXAM:  VITAL SIGNS: ED Triage Vitals  Enc Vitals Group     BP 11/14/17 1234 (!) 145/78     Pulse Rate 11/14/17 1234 60     Resp 11/14/17 1234 18     Temp 11/14/17 1234 98.6 F (37 C)     Temp Source 11/14/17 1234 Oral     SpO2 11/14/17 1234 99 %     Weight 11/14/17 1234 130 lb (59 kg)     Height 11/14/17 1234 5\' 2"  (1.575 m)     Head Circumference --      Peak Flow --      Pain Score 11/14/17 1238 10     Pain Loc --      Pain Edu? --      Excl. in [place]? --      Constitutional: Alert and oriented. Well appearing and in no acute distress. Eyes: Conjunctivae are normal. PERRL. EOMI. Head: Atraumatic. ENT:      Ears:      Nose: No congestion/rhinnorhea.      Mouth/Throat: Mucous membranes are moist.  Neck: No stridor.  No cervical spine tenderness to palpation. Cardiovascular: Normal rate, regular rhythm.  Good peripheral circulation. Respiratory: Normal respiratory effort without tachypnea or retractions. Lungs CTAB. Good air entry to the bases with no decreased or absent breath sounds. Musculoskeletal: Full range of motion to all extremities. No gross deformities appreciated.  Pain with abduction of right shoulder.  Tenderness to palpation over supraspinatus.  Neurologic:  Normal speech and language. No gross focal neurologic deficits are appreciated.  Skin:  Skin is warm, dry and intact. No rash noted. Psychiatric: Mood and affect are  normal. Speech and behavior are normal. Patient exhibits appropriate insight and judgement.   ____________________________________________   LABS (all labs ordered are listed, but only abnormal results are displayed)  Labs Reviewed - No data to display ____________________________________________  EKG   ____________________________________________  RADIOLOGY Robinette Haines, personally viewed and evaluated these images (plain radiographs) as part of my medical decision making, as well as reviewing the written report by the radiologist.  Dg Shoulder Right  Result Date: 11/14/2017 CLINICAL DATA:  Pain, no injury EXAM: RIGHT SHOULDER - 2+ VIEW COMPARISON:  None. FINDINGS: There is no evidence of fracture or dislocation. There is no evidence of arthropathy or other focal bone abnormality. Soft tissues are unremarkable. IMPRESSION: No acute abnormality noted. Electronically Signed   By: Elta [place]  Lukens M.D.   On: 11/14/2017 13:44    ____________________________________________    PROCEDURES  Procedure(s) performed:    Procedures    Medications  lidocaine (LIDODERM) 5 % 1 patch (1 patch Transdermal Patch Applied 11/14/17 1427)  ketorolac (TORADOL) 30 MG/ML injection 30 mg (30 mg Intramuscular Given 11/14/17 1426)     ____________________________________________   INITIAL IMPRESSION / ASSESSMENT AND PLAN / ED COURSE  Pertinent labs & imaging results that were available during my care of the patient were reviewed by me and considered in my medical decision making (see chart for details).  Review of the [place] CSRS was performed in accordance of the [place] prior to dispensing any controlled drugs.   Patient's diagnosis is consistent with rotator cuff pain.  Vital signs and exam are reassuring.  X-ray negative for acute bony abnormalities.  IM Toradol was given.  Patient will be discharged home with prescriptions for Toradol. Patient is to follow up with PCP as directed. Patient is  given ED precautions to return to the ED for any worsening or new symptoms.     ____________________________________________  FINAL CLINICAL IMPRESSION(S) / ED DIAGNOSES  Final diagnoses:  Disorder of right rotator cuff      NEW MEDICATIONS STARTED DURING THIS VISIT:  ED Discharge Orders         Ordered    ketorolac (TORADOL) 10 MG tablet  Every 6 hours PRN     11/14/17 1423    lidocaine (LIDODERM) 5 %  Every 24 hours     11/14/17 1423              This chart was dictated using voice recognition software/Dragon. Despite best efforts to proofread, errors can occur which can change the meaning. Any change was purely unintentional.    Laban Emperor, PA-C 11/14/17 [place], [place], MD 11/15/17 [phone]

## 2017-11-14 NOTE — ED Notes (Signed)

## 2017-11-14 NOTE — ED Triage Notes (Signed)
Pt c/o right shoulder pain for the past week. 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

## 2017-11-27 ENCOUNTER — Other Ambulatory Visit: Payer: Self-pay

## 2017-11-27 ENCOUNTER — Emergency Department
Admission: EM | Admit: 2017-11-27 | Discharge: 2017-11-27 | Disposition: A | Discharge status: Home / Self Care | Payer: Self-pay | Attending: Emergency Medicine | Admitting: Emergency Medicine

## 2017-11-27 ENCOUNTER — Encounter: Payer: Self-pay | Admitting: Emergency Medicine

## 2017-11-27 DIAGNOSIS — H9201 Otalgia, right ear: Secondary | ICD-10-CM | POA: Insufficient documentation

## 2017-11-27 DIAGNOSIS — F17210 Nicotine dependence, cigarettes, uncomplicated: Secondary | ICD-10-CM | POA: Insufficient documentation

## 2017-11-27 DIAGNOSIS — R197 Diarrhea, unspecified: Secondary | ICD-10-CM

## 2017-11-27 DIAGNOSIS — I10 Essential (primary) hypertension: Secondary | ICD-10-CM | POA: Insufficient documentation

## 2017-11-27 DIAGNOSIS — Z79899 Other long term (current) drug therapy: Secondary | ICD-10-CM | POA: Insufficient documentation

## 2017-11-27 MED ORDER — NEOMYCIN-POLYMYXIN-HC 3.5-10000-1 OT SOLN: 3.0000 [drp] | Freq: Three times a day (TID) | OTIC | 0 refills | Status: AC

## 2017-11-27 NOTE — ED Triage Notes (Signed)
Pt states diarrhea that began this am, cough and right ear pain, appears in NAD.

## 2017-11-27 NOTE — ED Provider Notes (Signed)
[facility] Emergency Department Provider Note  ____________________________________________   First MD Initiated Contact with Patient 11/27/17 [phone]     (approximate)  I have reviewed the triage vital signs and the nursing notes.   HISTORY  Chief Complaint Diarrhea; Cough; and Otalgia   HPI Sherry Little is a 47 y.o. female presents to the emergency department with complaint of diarrhea beginning this morning.  Patient states that she had diarrhea 3 times.  She denies any nausea or vomiting.  She is unaware of any fever.  Patient also complains of right ear pain.  She denies any upper respiratory issues, fever, chills.  She has not taken any over-the-counter medication this morning.  Patient rates his pain as 7 out of 10.   Past Medical History:  Diagnosis Date  . Heart murmur   . Hypertension   . Migraine   . VSD (ventricular septal defect and aortic arch hypoplasia     Patient Active Problem List   Diagnosis Date Noted  . Portal hypertension ([place])   . Other diseases of stomach and duodenum   . Secondary esophageal varices without bleeding ([place])   . Loss of weight   . Iron deficiency anemia due to chronic blood loss   . Gastritis without bleeding   . Polyp of sigmoid colon   . Abdominal pain, epigastric 03/17/2016  . Symptomatic anemia 03/17/2016  . Valvular heart disease 03/17/2016  . Bradycardia 03/17/2016  . Abdominal pain 03/17/2016    Past Surgical History:  Procedure Laterality Date  . COLONOSCOPY WITH PROPOFOL N/A 03/19/2016   Procedure: COLONOSCOPY WITH PROPOFOL;  Surgeon: Lucilla Lame, MD;  Location: [facility] ENDOSCOPY;  Service: Endoscopy;  Laterality: N/A;  . ESOPHAGOGASTRODUODENOSCOPY (EGD) WITH PROPOFOL N/A 03/19/2016   Procedure: ESOPHAGOGASTRODUODENOSCOPY (EGD) WITH PROPOFOL;  Surgeon: Lucilla Lame, MD;  Location: [facility] ENDOSCOPY;  Service: Endoscopy;  Laterality: N/A;  . GANGLION CYST EXCISION  2008   left wrist  . HAND SURGERY      cyst romved from left thumb  . TUBAL LIGATION      Prior to Admission medications   Medication Sig Start Date End Date Taking? Authorizing Provider  cyclobenzaprine (FLEXERIL) 5 MG tablet Take 1 tablet (5 mg total) by mouth 3 (three) times daily as needed for muscle spasms. 05/29/17   Menshew, Dannielle Karvonen, PA-C  feeding supplement (BOOST / RESOURCE BREEZE) LIQD Take 1 Container by mouth 3 (three) times daily between meals. 03/19/16   Epifanio Lesches, MD  fluticasone (FLONASE) 50 MCG/ACT nasal spray Place 2 sprays into both nostrils daily. 12/28/15   Menshew, Dannielle Karvonen, PA-C  mirtazapine (REMERON) 30 MG tablet Take 30 mg by mouth at bedtime.    [provider]  neomycin-polymyxin-hydrocortisone (CORTISPORIN) OTIC solution Place 3 drops into the right ear 3 (three) times daily for 10 days. 11/27/17 12/07/17  Johnn Hai, PA-C  OLANZapine (ZYPREXA) 20 MG tablet Take 20 mg by mouth at bedtime.    [provider]    Allergies Bee venom; Mushroom ext cmplx(shiitake-reishi-mait); Mobic [meloxicam]; and Tramadol  No family history on file.  Social History Social History   Tobacco Use  . Smoking status: Current Every Day Smoker    Packs/day: 0.50    Types: Cigarettes  . Smokeless tobacco: Never Used  Substance Use Topics  . Alcohol use: No  . Drug use: Yes    Types: Marijuana    Comment: Last used around thanksgiving    Review of Systems Constitutional:  No fever/chills Eyes: No visual changes. ENT: Positive right ear pain. Cardiovascular: Denies chest pain. Respiratory: Denies shortness of breath. Gastrointestinal: No abdominal pain.  No nausea, no vomiting.  Positive diarrhea.  No constipation. Genitourinary: Negative for dysuria. Musculoskeletal: Negative for back pain. Skin: Negative for rash. Neurological: Negative for headaches, focal weakness or numbness. ____________________________________________   PHYSICAL EXAM:  VITAL SIGNS: ED  Triage Vitals  Enc Vitals Group     BP 11/27/17 0848 (!) 160/90     Pulse Rate 11/27/17 0848 (!) 48     Resp 11/27/17 0848 16     Temp 11/27/17 0848 98 F (36.7 C)     Temp Source 11/27/17 0848 Oral     SpO2 11/27/17 0848 99 %     Weight 11/27/17 0840 130 lb (59 kg)     Height 11/27/17 0840 5\' 2"  (1.575 m)     Head Circumference --      Peak Flow --      Pain Score 11/27/17 0840 7     Pain Loc --      Pain Edu? --      Excl. in [place]? --    Constitutional: Alert and oriented. Well appearing and in no acute distress. Eyes: Conjunctivae are normal.  Head: Atraumatic. Nose: No congestion/rhinnorhea.  EACs and TMs are clear however patient complains of right ear itching. Mouth/Throat: Mucous membranes are moist.  Oropharynx non-erythematous. Neck: No stridor.   Hematological/Lymphatic/Immunilogical: No cervical lymphadenopathy. Cardiovascular: Normal rate, regular rhythm. Grossly normal heart sounds.  Good peripheral circulation. Respiratory: Normal respiratory effort.  No retractions. Lungs CTAB. Gastrointestinal: Soft and nontender. No distention.  Bowel sounds normoactive x4 quadrants.  No point tenderness. Musculoskeletal: Moves upper and lower extremities without any difficulty.  Normal gait was noted. Neurologic:  Normal speech and language. No gross focal neurologic deficits are appreciated. No gait instability. Skin:  Skin is warm, dry and intact. No rash noted. Psychiatric: Mood and affect are normal. Speech and behavior are normal.  ____________________________________________   LABS (all labs ordered are listed, but only abnormal results are displayed)  Labs Reviewed - No data to display ____________________________________________  PROCEDURES  Procedure(s) performed: None  Procedures  Critical Care performed: No  ____________________________________________   INITIAL IMPRESSION / ASSESSMENT AND PLAN / ED COURSE  As part of my medical decision making, I  reviewed the following data within the electronic MEDICAL RECORD NUMBER Notes from prior ED visits and [place] Controlled Substance Database  Patient presents to the emergency department with complaint of right ear pain and also development of diarrhea during the night.  Patient denies any fever or chills.  There is been no vomiting or nausea.  No other family members present with same symptoms.  Patient was placed on Cortisporin Otic suspension to use in her right ear as needed for ear pain.  She is also instructed to use clear liquids for the next 24 hours and to remain out of work for the next 2 days.  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  ____________________________________________   FINAL CLINICAL IMPRESSION(S) / ED DIAGNOSES  Final diagnoses:  Acute otalgia, right  Diarrhea, unspecified type     ED Discharge Orders         Ordered    neomycin-polymyxin-hydrocortisone (CORTISPORIN) OTIC solution  3 times daily     11/27/17 0916           Note:  This document was  prepared using Systems analyst and may include unintentional dictation errors.    Johnn Hai, PA-C 11/27/17 1135    Darel Hong, MD 11/27/17 [phone]

## 2017-11-27 NOTE — Discharge Instructions (Signed)
Follow-up with [facility] if any continued problems.  Begin clear liquids starting today.  This should help with your diarrhea.  Tomorrow when you begin eating stay away from fried or greasy foods.  Eat bland foods such as rice, toast, applesauce or plain baked potato.  Tylenol if needed.  Begin using Cortisporin otic suspension to your right ear 3 times a day as needed.

## 2017-11-27 NOTE — ED Notes (Signed)
See triage note  States she developed right ear pain 2 days ago  Then this am had couple of episodes of diarrhea and occasional prod cough  Afebrile on arrival

## 2017-12-10 ENCOUNTER — Other Ambulatory Visit: Payer: Self-pay

## 2017-12-10 ENCOUNTER — Emergency Department
Admission: EM | Admit: 2017-12-10 | Discharge: 2017-12-10 | Disposition: A | Discharge status: Home / Self Care | Payer: Self-pay | Attending: Emergency Medicine | Admitting: Emergency Medicine

## 2017-12-10 DIAGNOSIS — R110 Nausea: Secondary | ICD-10-CM | POA: Insufficient documentation

## 2017-12-10 DIAGNOSIS — R197 Diarrhea, unspecified: Secondary | ICD-10-CM | POA: Insufficient documentation

## 2017-12-10 DIAGNOSIS — Z5321 Procedure and treatment not carried out due to patient leaving prior to being seen by health care provider: Secondary | ICD-10-CM | POA: Insufficient documentation

## 2017-12-10 DIAGNOSIS — R1033 Periumbilical pain: Secondary | ICD-10-CM | POA: Insufficient documentation

## 2017-12-10 LAB — COMPREHENSIVE METABOLIC PANEL
ALBUMIN: 3.9 g/dL (ref 3.5–5.0)
ALT: 13 U/L (ref 0–44)
ANION GAP: 6 (ref 5–15)
AST: 18 U/L (ref 15–41)
Alkaline Phosphatase: 84 U/L (ref 38–126)
BUN: 14 mg/dL (ref 6–20)
CO2: 20 mmol/L — AB (ref 22–32)
Calcium: 10.1 mg/dL (ref 8.9–10.3)
Chloride: 111 mmol/L (ref 98–111)
Creatinine, Ser: 0.68 mg/dL (ref 0.44–1.00)
GFR calc Af Amer: >60 mL/min (ref >60)
GFR calc non Af Amer: >60 mL/min (ref >60)
GLUCOSE: 108 mg/dL — AB (ref 70–99)
POTASSIUM: 4.1 mmol/L (ref 3.5–5.1)
SODIUM: 137 mmol/L (ref 135–145)
Total Bilirubin: 0.5 mg/dL (ref 0.3–1.2)
Total Protein: 7.2 g/dL (ref 6.5–8.1)

## 2017-12-10 LAB — URINALYSIS, COMPLETE (UACMP) WITH MICROSCOPIC
BACTERIA UA: NONE SEEN
Bilirubin Urine: NEGATIVE
Glucose, UA: NEGATIVE mg/dL
HGB URINE DIPSTICK: NEGATIVE
Ketones, ur: NEGATIVE mg/dL
Nitrite: NEGATIVE
PROTEIN: NEGATIVE mg/dL
Specific Gravity, Urine: 1.019 (ref 1.005–1.030)
pH: 6 (ref 5.0–8.0)

## 2017-12-10 LAB — CBC
HEMATOCRIT: 38.7 % (ref 35.0–47.0)
HEMOGLOBIN: 13 g/dL (ref 12.0–16.0)
MCH: 28.6 pg (ref 26.0–34.0)
MCHC: 33.6 g/dL (ref 32.0–36.0)
MCV: 85.3 fL (ref 80.0–100.0)
Platelets: 209 10*3/uL (ref 150–440)
RBC: 4.53 MIL/uL (ref 3.80–5.20)
RDW: 17.3 % — ABNORMAL HIGH (ref 11.5–14.5)
WBC: 7.3 10*3/uL (ref 3.6–11.0)

## 2017-12-10 LAB — LIPASE, BLOOD: Lipase: 29 U/L (ref 11–51)

## 2017-12-10 LAB — POCT PREGNANCY, URINE: PREG TEST UR: NEGATIVE

## 2017-12-10 NOTE — ED Notes (Signed)
Pt called from [facility] to treatment room, no response 

## 2017-12-10 NOTE — ED Notes (Signed)
Pt called to room from [place], no response.

## 2017-12-10 NOTE — ED Triage Notes (Signed)
Pt c/o umbilical pain that started X 2 days ago. States hernia is that area that patient pushed back in today. Nausea. Mild diarrhea

## 2017-12-11 ENCOUNTER — Telehealth: Payer: Self-pay | Admitting: Emergency Medicine

## 2017-12-11 NOTE — Telephone Encounter (Signed)
Called patient due to lwot to inquire about condition and follow up plans.  No answer and no voicemail  

## 2018-02-11 IMAGING — CR DG CHEST 2V
2 series · 2 of 2 positions shown · non-contrast
Comparison: 10/09/2009

CLINICAL DATA: Nonproductive cough and congestion for 2 weeks,
chest pain

EXAM:
CHEST  2 VIEW

[chest pa]
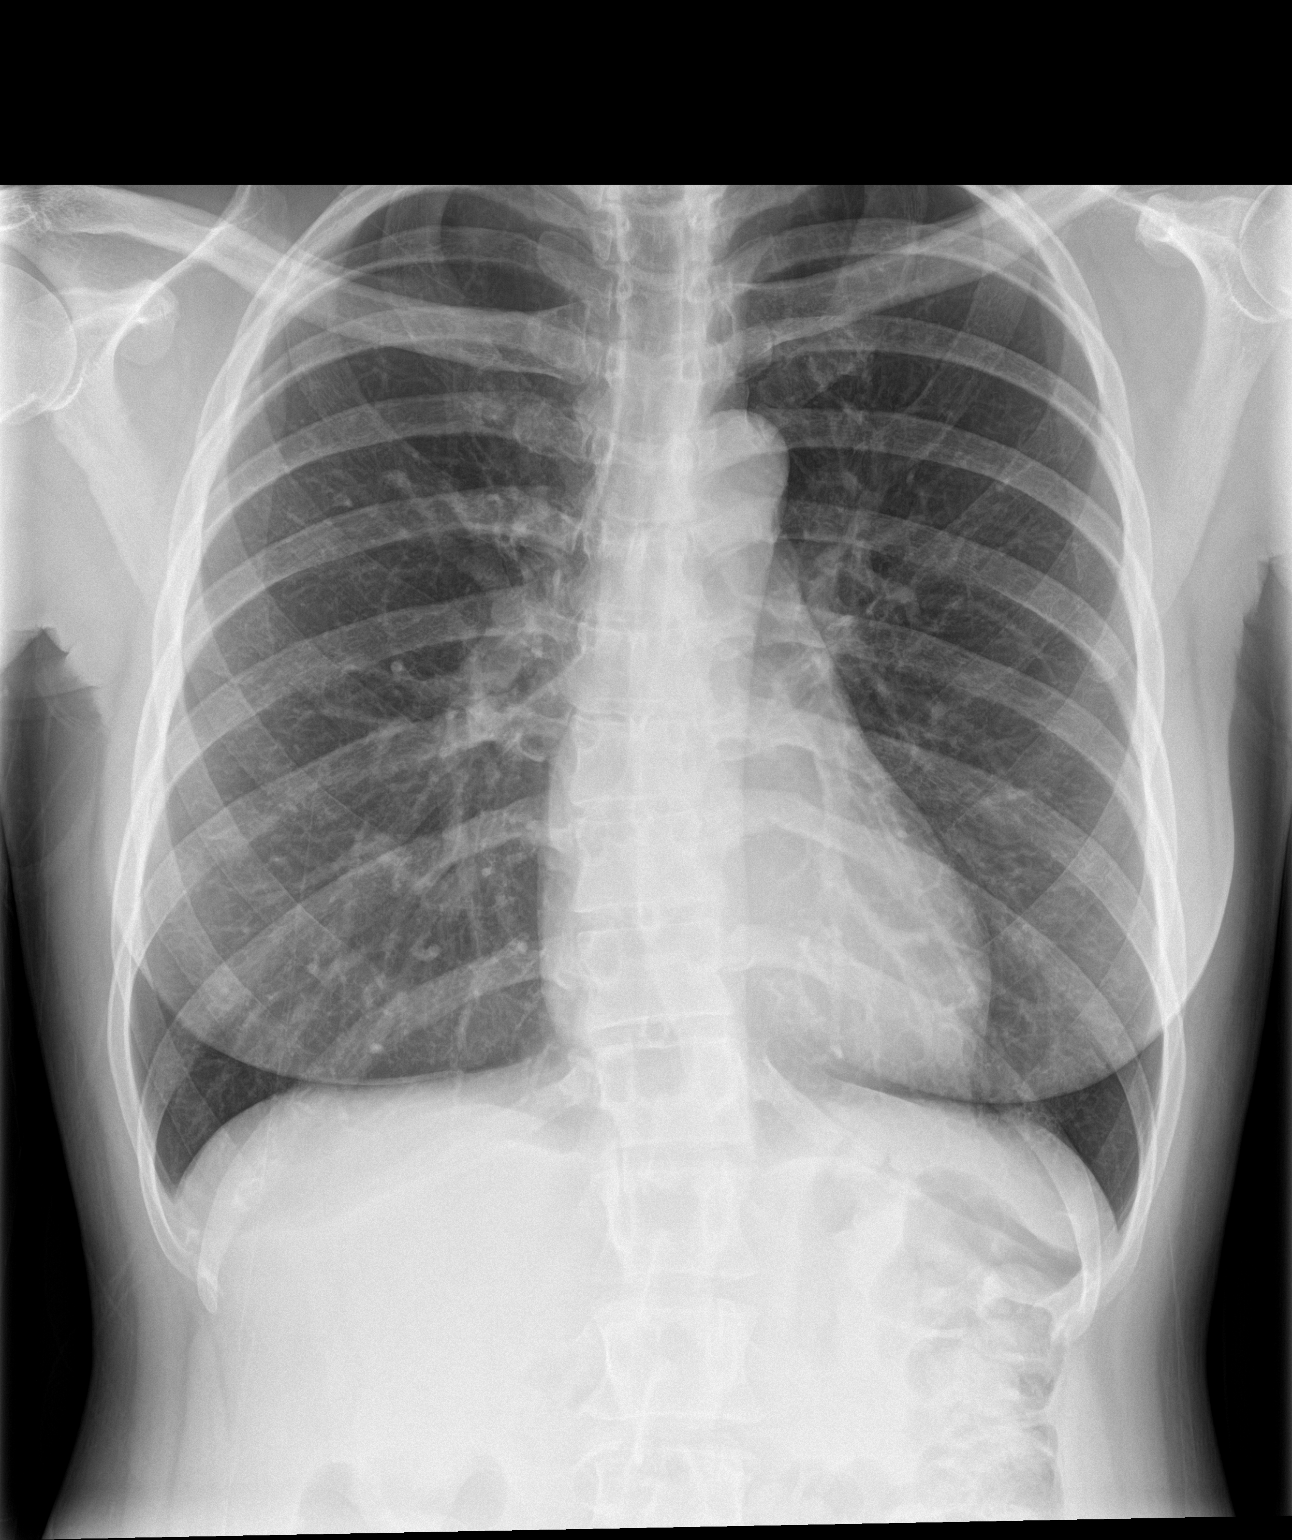

[chest lat]
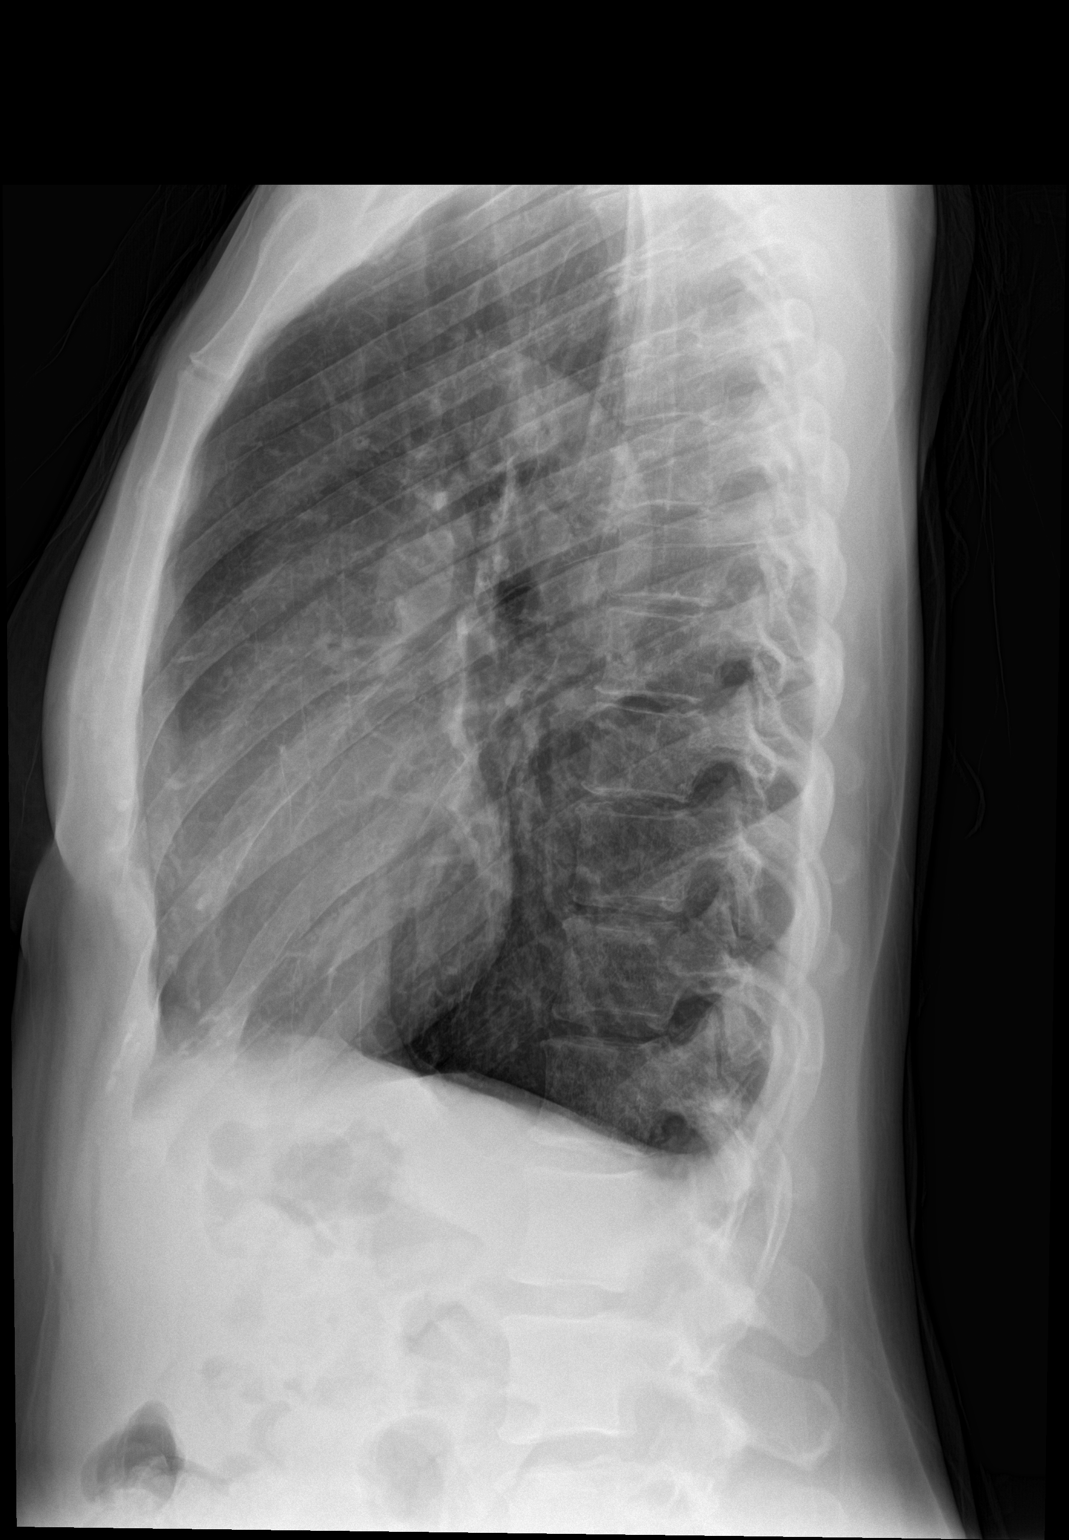

[2 of 2 positions shown; findings below may reference images not displayed]

FINDINGS: Cardiomediastinal silhouette is stable. Mild thoracic
dextroscoliosis again noted. No acute infiltrate or pleural
effusion. No pulmonary edema.
IMPRESSION: No active cardiopulmonary disease.  Mild thoracic dextroscoliosis.

## 2018-03-12 ENCOUNTER — Encounter: Payer: Self-pay | Admitting: Emergency Medicine

## 2018-03-12 ENCOUNTER — Emergency Department
Admission: EM | Admit: 2018-03-12 | Discharge: 2018-03-12 | Disposition: A | Discharge status: Home / Self Care | Payer: Self-pay | Attending: Emergency Medicine | Admitting: Emergency Medicine

## 2018-03-12 DIAGNOSIS — M25511 Pain in right shoulder: Secondary | ICD-10-CM | POA: Insufficient documentation

## 2018-03-12 DIAGNOSIS — F17210 Nicotine dependence, cigarettes, uncomplicated: Secondary | ICD-10-CM | POA: Insufficient documentation

## 2018-03-12 DIAGNOSIS — I10 Essential (primary) hypertension: Secondary | ICD-10-CM | POA: Insufficient documentation

## 2018-03-12 DIAGNOSIS — F1210 Cannabis abuse, uncomplicated: Secondary | ICD-10-CM | POA: Insufficient documentation

## 2018-03-12 DIAGNOSIS — Z79899 Other long term (current) drug therapy: Secondary | ICD-10-CM | POA: Insufficient documentation

## 2018-03-12 MED ORDER — KETOROLAC TROMETHAMINE 10 MG PO TABS: 10.0000 mg | ORAL_TABLET | Freq: Four times a day (QID) | ORAL | 0 refills | Status: DC | PRN

## 2018-03-12 MED ORDER — KETOROLAC TROMETHAMINE 30 MG/ML IJ SOLN
30.0000 mg | Freq: Once | INTRAMUSCULAR | Status: AC
  Administered 2018-03-12: 30 mg via INTRAMUSCULAR
  Filled 2018-03-12: qty 1

## 2018-03-12 MED ORDER — LIDOCAINE 5 % EX PTCH
1.0000 | MEDICATED_PATCH | CUTANEOUS | Status: DC
  Administered 2018-03-12: 1 via TRANSDERMAL
  Filled 2018-03-12: qty 1

## 2018-03-12 MED ORDER — CYCLOBENZAPRINE HCL 5 MG PO TABS: ORAL_TABLET | ORAL | 0 refills | Status: DC

## 2018-03-12 NOTE — ED Triage Notes (Signed)
Pt reports woke up with her right shoulder hurting a few days ago and it is not getting better. Pt reports can't lift arm.

## 2018-03-12 NOTE — ED Provider Notes (Signed)
[facility] Emergency Department Provider Note  ____________________________________________  Time seen: Approximately 12:15 PM  I have reviewed the triage vital signs and the nursing notes.   HISTORY  Chief Complaint Shoulder Pain    HPI Sherry Little is a 47 y.o. female presents to emergency department for evaluation of right posterior shoulder pain for 1 day.  Pain is in the back of her shoulder.  Pain does not radiate.  Pain is worse when she moves her right arm.  Patient states that she does a lot of lifting at work and might have pulled her shoulder.  No specific trauma. She has injured this shoulder before.  She has not taken anything for pain.  No shortness of breath, chest pain, numbness, tingling.  Past Medical History:  Diagnosis Date  . Heart murmur   . Hypertension   . Migraine   . VSD (ventricular septal defect and aortic arch hypoplasia     Patient Active Problem List   Diagnosis Date Noted  . Portal hypertension ([place])   . Other diseases of stomach and duodenum   . Secondary esophageal varices without bleeding ([place])   . Loss of weight   . Iron deficiency anemia due to chronic blood loss   . Gastritis without bleeding   . Polyp of sigmoid colon   . Abdominal pain, epigastric 03/17/2016  . Symptomatic anemia 03/17/2016  . Valvular heart disease 03/17/2016  . Bradycardia 03/17/2016  . Abdominal pain 03/17/2016    Past Surgical History:  Procedure Laterality Date  . COLONOSCOPY WITH PROPOFOL N/A 03/19/2016   Procedure: COLONOSCOPY WITH PROPOFOL;  Surgeon: Lucilla Lame, MD;  Location: [facility] ENDOSCOPY;  Service: Endoscopy;  Laterality: N/A;  . ESOPHAGOGASTRODUODENOSCOPY (EGD) WITH PROPOFOL N/A 03/19/2016   Procedure: ESOPHAGOGASTRODUODENOSCOPY (EGD) WITH PROPOFOL;  Surgeon: Lucilla Lame, MD;  Location: [facility] ENDOSCOPY;  Service: Endoscopy;  Laterality: N/A;  . GANGLION CYST EXCISION  2008   left wrist  . HAND SURGERY     cyst romved from  left thumb  . TUBAL LIGATION      Prior to Admission medications   Medication Sig Start Date End Date Taking? Authorizing Provider  cyclobenzaprine (FLEXERIL) 5 MG tablet Take 1-2 tablets 3 times daily as needed 03/12/18   Laban Emperor, PA-C  feeding supplement (BOOST / RESOURCE BREEZE) LIQD Take 1 Container by mouth 3 (three) times daily between meals. 03/19/16   Epifanio Lesches, MD  fluticasone (FLONASE) 50 MCG/ACT nasal spray Place 2 sprays into both nostrils daily. 12/28/15   Menshew, Dannielle Karvonen, PA-C  mirtazapine (REMERON) 30 MG tablet Take 30 mg by mouth at bedtime.    [provider]  OLANZapine (ZYPREXA) 20 MG tablet Take 20 mg by mouth at bedtime.    [provider]    Allergies Bee venom; Mushroom ext cmplx(shiitake-reishi-mait); Mobic [meloxicam]; and Tramadol  No family history on file.  Social History Social History   Tobacco Use  . Smoking status: Current Every Day Smoker    Packs/day: 0.50    Types: Cigarettes  . Smokeless tobacco: Never Used  Substance Use Topics  . Alcohol use: No  . Drug use: Yes    Types: Marijuana    Comment: Last used around thanksgiving     Review of Systems  Cardiovascular: No chest pain. Respiratory: No SOB. Gastrointestinal: No abdominal pain.  No nausea, no vomiting.  Musculoskeletal: Positive for shoulder pain. Skin: Negative for rash, abrasions, lacerations, ecchymosis. Neurological: Negative for headaches, numbness or tingling  ____________________________________________   PHYSICAL EXAM:  VITAL SIGNS: ED Triage Vitals  Enc Vitals Group     BP 03/12/18 1141 (!) 162/82     Pulse Rate 03/12/18 1141 64     Resp 03/12/18 1141 14     Temp 03/12/18 1141 97.9 F (36.6 C)     Temp Source 03/12/18 1141 Oral     SpO2 03/12/18 1141 98 %     Weight 03/12/18 1138 135 lb (61.2 kg)     Height 03/12/18 1138 5\' 2"  (1.575 m)     Head Circumference --      Peak Flow --      Pain Score 03/12/18 1138 10      Pain Loc --      Pain Edu? --      Excl. in [place]? --      Constitutional: Alert and oriented. Well appearing and in no acute distress. Eyes: Conjunctivae are normal. PERRL. EOMI. Head: Atraumatic. ENT:      Ears:      Nose: No congestion/rhinnorhea.      Mouth/Throat: Mucous membranes are moist.  Neck: No stridor.  Cardiovascular: Normal rate, regular rhythm.  Good peripheral circulation.  Symmetric radial pulses bilaterally. Respiratory: Normal respiratory effort without tachypnea or retractions. Lungs CTAB. Good air entry to the bases with no decreased or absent breath sounds. Musculoskeletal: Full range of motion to all extremities. No gross deformities appreciated.  Tenderness to palpation to posterior right shoulder.  No tenderness to palpation to anterior, superior shoulder.  Strength equal in upper extremities bilaterally.  Full range of motion of right shoulder. Neurologic:  Normal speech and language. No gross focal neurologic deficits are appreciated.  Skin:  Skin is warm, dry and intact. No rash noted. Psychiatric: Mood and affect are normal. Speech and behavior are normal. Patient exhibits appropriate insight and judgement.   ____________________________________________   LABS (all labs ordered are listed, but only abnormal results are displayed)  Labs Reviewed - No data to display ____________________________________________  EKG   ____________________________________________  RADIOLOGY   No results found.  ____________________________________________    PROCEDURES  Procedure(s) performed:    Procedures    Medications  lidocaine (LIDODERM) 5 % 1 patch (1 patch Transdermal Patch Applied 03/12/18 1230)  ketorolac (TORADOL) 30 MG/ML injection 30 mg (30 mg Intramuscular Given 03/12/18 1229)     ____________________________________________   INITIAL IMPRESSION / ASSESSMENT AND PLAN / ED COURSE  Pertinent labs & imaging results that were  available during my care of the patient were reviewed by me and considered in my medical decision making (see chart for details).  Review of the [place] CSRS was performed in accordance of the [place] prior to dispensing any controlled drugs.     Patient presented to the emergency department for evaluation of shoulder pain.  Pain is likely muscular.  Patient is agreeable to hold off on imaging at this time.  Patient will be discharged home with prescriptions for Flexeril. Patient is to follow up with primary care as directed. Patient is given ED precautions to return to the ED for any worsening or new symptoms.     ____________________________________________  FINAL CLINICAL IMPRESSION(S) / ED DIAGNOSES  Final diagnoses:  Acute pain of right shoulder      NEW MEDICATIONS STARTED DURING THIS VISIT:  ED Discharge Orders         Ordered    ketorolac (TORADOL) 10 MG tablet  Every 6 hours PRN,   Status:  Discontinued  03/12/18 1255    cyclobenzaprine (FLEXERIL) 5 MG tablet     03/12/18 1256              This chart was dictated using voice recognition software/Dragon. Despite best efforts to proofread, errors can occur which can change the meaning. Any change was purely unintentional.    Laban Emperor, PA-C 03/12/18 1543    Lisa Roca, MD 03/12/18 [phone]

## 2018-03-12 NOTE — ED Notes (Signed)
See triage note  Presents with right shoulder pain  States she noticed pain about 3 days ago  Pain is to posterier shoulder and into neck  Denies any injury  Good pulses

## 2018-05-05 ENCOUNTER — Other Ambulatory Visit: Payer: Self-pay

## 2018-05-05 ENCOUNTER — Encounter: Payer: Self-pay | Admitting: Emergency Medicine

## 2018-05-05 ENCOUNTER — Emergency Department
Admission: EM | Admit: 2018-05-05 | Discharge: 2018-05-05 | Disposition: A | Discharge status: Home / Self Care | Payer: Self-pay | Attending: Emergency Medicine | Admitting: Emergency Medicine

## 2018-05-05 DIAGNOSIS — Z79899 Other long term (current) drug therapy: Secondary | ICD-10-CM | POA: Insufficient documentation

## 2018-05-05 DIAGNOSIS — R030 Elevated blood-pressure reading, without diagnosis of hypertension: Secondary | ICD-10-CM | POA: Insufficient documentation

## 2018-05-05 DIAGNOSIS — I10 Essential (primary) hypertension: Secondary | ICD-10-CM | POA: Insufficient documentation

## 2018-05-05 DIAGNOSIS — F17210 Nicotine dependence, cigarettes, uncomplicated: Secondary | ICD-10-CM | POA: Insufficient documentation

## 2018-05-05 DIAGNOSIS — M5431 Sciatica, right side: Secondary | ICD-10-CM | POA: Insufficient documentation

## 2018-05-05 MED ORDER — KETOROLAC TROMETHAMINE 10 MG PO TABS: 10.0000 mg | ORAL_TABLET | Freq: Three times a day (TID) | ORAL | 0 refills | Status: DC | PRN

## 2018-05-05 MED ORDER — KETOROLAC TROMETHAMINE 30 MG/ML IJ SOLN: 30.0000 mg | Freq: Once | INTRAMUSCULAR | Status: DC

## 2018-05-05 MED ORDER — LIDOCAINE 5 % EX PTCH
1.0000 | MEDICATED_PATCH | CUTANEOUS | Status: DC
  Administered 2018-05-05: 1 via TRANSDERMAL
  Filled 2018-05-05: qty 1

## 2018-05-05 MED ORDER — LIDOCAINE 5 % EX PTCH: 1.0000 | MEDICATED_PATCH | CUTANEOUS | 0 refills | Status: DC

## 2018-05-05 MED ORDER — KETOROLAC TROMETHAMINE 30 MG/ML IJ SOLN
15.0000 mg | Freq: Once | INTRAMUSCULAR | Status: AC
  Administered 2018-05-05: 15 mg via INTRAMUSCULAR
  Filled 2018-05-05: qty 1

## 2018-05-05 MED ORDER — CYCLOBENZAPRINE HCL 5 MG PO TABS: ORAL_TABLET | ORAL | 0 refills | Status: DC

## 2018-05-05 NOTE — ED Triage Notes (Signed)
Pain down right leg. No injury.  She believes it may be sciatica

## 2018-05-05 NOTE — ED Provider Notes (Signed)
[facility] Emergency Department Provider Note  ____________________________________________  Time seen: Approximately 11:37 AM  I have reviewed the triage vital signs and the nursing notes.   HISTORY  Chief Complaint Back Pain and Leg Pain    HPI Sherry Little is a 48 y.o. female that presents to the emergency department for evaluation of right-sided hip pain for 1 week.  Pain starts in her right buttocks and radiates into her right hip.  No trauma.  She is on her feet all day for work.  She says that she has had sciatica in the past and this feels the same.  No trauma.  No bowel or bladder dysfunction or saddle anesthesias.  No IV drug use.  No fever, chills, nausea, vomiting, abdominal pain, numbness, tingling.  Patient takes blood pressure medication and has not taken her medication yet today.   Past Medical History:  Diagnosis Date  . Heart murmur   . Hypertension   . Migraine   . VSD (ventricular septal defect and aortic arch hypoplasia     Patient Active Problem List   Diagnosis Date Noted  . Portal hypertension ([place])   . Other diseases of stomach and duodenum   . Secondary esophageal varices without bleeding ([place])   . Loss of weight   . Iron deficiency anemia due to chronic blood loss   . Gastritis without bleeding   . Polyp of sigmoid colon   . Abdominal pain, epigastric 03/17/2016  . Symptomatic anemia 03/17/2016  . Valvular heart disease 03/17/2016  . Bradycardia 03/17/2016  . Abdominal pain 03/17/2016    Past Surgical History:  Procedure Laterality Date  . COLONOSCOPY WITH PROPOFOL N/A 03/19/2016   Procedure: COLONOSCOPY WITH PROPOFOL;  Surgeon: Lucilla Lame, MD;  Location: [facility] ENDOSCOPY;  Service: Endoscopy;  Laterality: N/A;  . ESOPHAGOGASTRODUODENOSCOPY (EGD) WITH PROPOFOL N/A 03/19/2016   Procedure: ESOPHAGOGASTRODUODENOSCOPY (EGD) WITH PROPOFOL;  Surgeon: Lucilla Lame, MD;  Location: [facility] ENDOSCOPY;  Service: Endoscopy;   Laterality: N/A;  . GANGLION CYST EXCISION  2008   left wrist  . HAND SURGERY     cyst romved from left thumb  . TUBAL LIGATION      Prior to Admission medications   Medication Sig Start Date End Date Taking? Authorizing Provider  cyclobenzaprine (FLEXERIL) 5 MG tablet Take 1-2 tablets 3 times daily as needed 05/05/18   Laban Emperor, PA-C  feeding supplement (BOOST / RESOURCE BREEZE) LIQD Take 1 Container by mouth 3 (three) times daily between meals. 03/19/16   Epifanio Lesches, MD  fluticasone (FLONASE) 50 MCG/ACT nasal spray Place 2 sprays into both nostrils daily. 12/28/15   Menshew, Dannielle Karvonen, PA-C  ketorolac (TORADOL) 10 MG tablet Take 1 tablet (10 mg total) by mouth every 8 (eight) hours as needed. 05/05/18   Laban Emperor, PA-C  lidocaine (LIDODERM) 5 % Place 1 patch onto the skin daily. Remove & Discard patch within 12 hours or as directed by MD 05/05/18   Laban Emperor, PA-C  mirtazapine (REMERON) 30 MG tablet Take 30 mg by mouth at bedtime.    [provider]  OLANZapine (ZYPREXA) 20 MG tablet Take 20 mg by mouth at bedtime.    [provider]    Allergies Bee venom; Mushroom ext cmplx(shiitake-reishi-mait); Mobic [meloxicam]; and Tramadol  No family history on file.  Social History Social History   Tobacco Use  . Smoking status: Current Every Day Smoker    Packs/day: 0.50    Types: Cigarettes  . Smokeless  tobacco: Never Used  Substance Use Topics  . Alcohol use: No  . Drug use: Yes    Types: Marijuana    Comment: Last used around thanksgiving     Review of Systems  Constitutional: No fever/chills Cardiovascular: No chest pain. Respiratory: No SOB. Gastrointestinal: No abdominal pain.  No nausea, no vomiting.  Musculoskeletal: Positive for hip pain.  Skin: Negative for rash, abrasions, lacerations, ecchymosis. Neurological: Negative for numbness or tingling   ____________________________________________   PHYSICAL  EXAM:  VITAL SIGNS: ED Triage Vitals  Enc Vitals Group     BP 05/05/18 1020 (!) 179/96     Pulse Rate 05/05/18 1020 64     Resp 05/05/18 1020 14     Temp 05/05/18 1020 98.1 F (36.7 C)     Temp Source 05/05/18 1020 Oral     SpO2 05/05/18 1020 96 %     Weight 05/05/18 1021 153 lb (69.4 kg)     Height 05/05/18 1017 5\' 2"  (1.575 m)     Head Circumference --      Peak Flow --      Pain Score 05/05/18 1017 10     Pain Loc --      Pain Edu? --      Excl. in [place]? --      Constitutional: Alert and oriented. Well appearing and in no acute distress. Eyes: Conjunctivae are normal. PERRL. EOMI. Head: Atraumatic. ENT:      Ears:      Nose: No congestion/rhinnorhea.      Mouth/Throat: Mucous membranes are moist.  Neck: No stridor.   Cardiovascular: Normal rate, regular rhythm.  Good peripheral circulation. Respiratory: Normal respiratory effort without tachypnea or retractions. Lungs CTAB. Good air entry to the bases with no decreased or absent breath sounds. Musculoskeletal: Full range of motion to all extremities. No gross deformities appreciated.  Tenderness to pain to right SI joint.  Full range of motion of right hip.  Strength equal in lower extremities bilaterally.  Antalgic gait. Neurologic:  Normal speech and language. No gross focal neurologic deficits are appreciated.  Skin:  Skin is warm, dry and intact. No rash noted. Psychiatric: Mood and affect are normal. Speech and behavior are normal. Patient exhibits appropriate insight and judgement.   ____________________________________________   LABS (all labs ordered are listed, but only abnormal results are displayed)  Labs Reviewed - No data to display ____________________________________________  EKG   ____________________________________________  RADIOLOGY   No results found.  ____________________________________________    PROCEDURES  Procedure(s) performed:    Procedures    Medications  lidocaine  (LIDODERM) 5 % 1 patch (1 patch Transdermal Patch Applied 05/05/18 1146)  ketorolac (TORADOL) 30 MG/ML injection 15 mg (15 mg Intramuscular Given 05/05/18 1148)     ____________________________________________   INITIAL IMPRESSION / ASSESSMENT AND PLAN / ED COURSE  Pertinent labs & imaging results that were available during my care of the patient were reviewed by me and considered in my medical decision making (see chart for details).  Review of the [place] CSRS was performed in accordance of the [place] prior to dispensing any controlled drugs.     Patient's diagnosis is consistent with sciatica.  Vital signs and exam are reassuring.  Exam is consistent with sciatica.  Patient blood pressure is elevated in the emergency department and patient has not yet taken her blood pressure medication today.  Blood pressure may also be elevated due to acute pain.  She will take medication when she gets  home and was educated on the importance of taking medication.  Patient will be discharged home with prescriptions for Flexeril, Lidoderm, a low dose and short course of Toradol. Patient is to follow up with primary care as directed. Patient is given ED precautions to return to the ED for any worsening or new symptoms.     ____________________________________________  FINAL CLINICAL IMPRESSION(S) / ED DIAGNOSES  Final diagnoses:  Sciatica of right side  Elevated blood pressure reading      NEW MEDICATIONS STARTED DURING THIS VISIT:  ED Discharge Orders         Ordered    cyclobenzaprine (FLEXERIL) 5 MG tablet     05/05/18 1211    lidocaine (LIDODERM) 5 %  Every 24 hours     05/05/18 1211    ketorolac (TORADOL) 10 MG tablet  Every 8 hours PRN     05/05/18 1211              This chart was dictated using voice recognition software/Dragon. Despite best efforts to proofread, errors can occur which can change the meaning. Any change was purely unintentional.    Laban Emperor, PA-C 05/05/18  [place], MD 05/05/18 1459

## 2018-05-05 NOTE — ED Notes (Signed)
See triage note  Presents with lower back pain which is moving into right leg  Ambulates with slight limp d/t pain  Sx's started about 1 week ago   Hx of sciatica

## 2018-08-07 IMAGING — US US PELVIS COMPLETE
1 series · 13 of 25 positions shown · non-contrast
Comparison: None available

CLINICAL DATA: Heavy menstrual bleeding for proximally

EXAM:
TRANSABDOMINAL AND TRANSVAGINAL ULTRASOUND OF PELVIS
TECHNIQUE: Both transabdominal and transvaginal ultrasound examinations of the
pelvis were performed. Transabdominal technique was performed for
global imaging of the pelvis including uterus, ovaries, adnexal
regions, and pelvic cul-de-sac. It was necessary to proceed with
endovaginal exam following the transabdominal exam to visualize the
endometrium and adnexae in better detail.

[Series 1: us pelvis complete · 0.24mm/px · 13 of 124 slices shown]
[im 1/124]
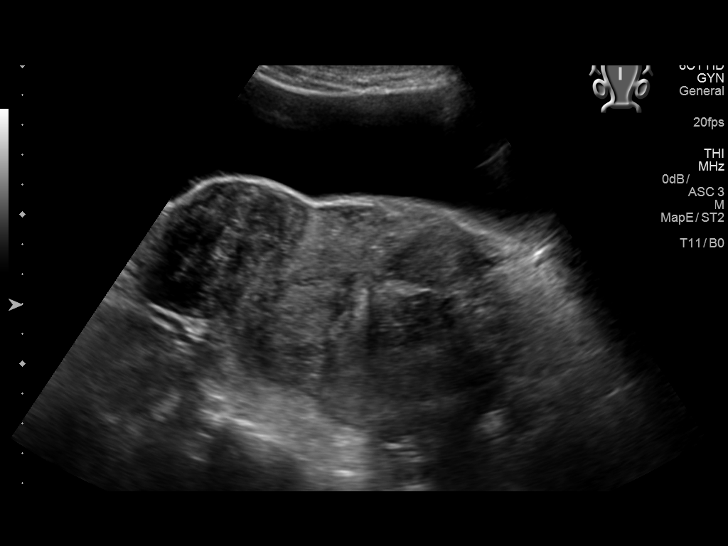
[im 11/124]
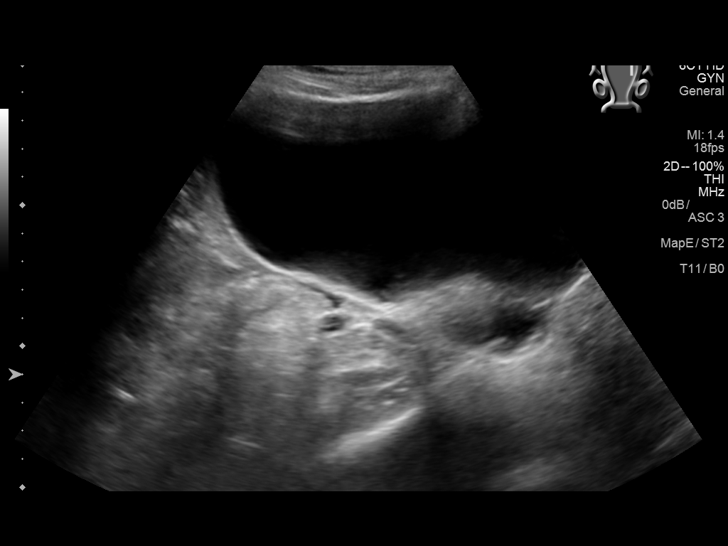
[im 21/124]
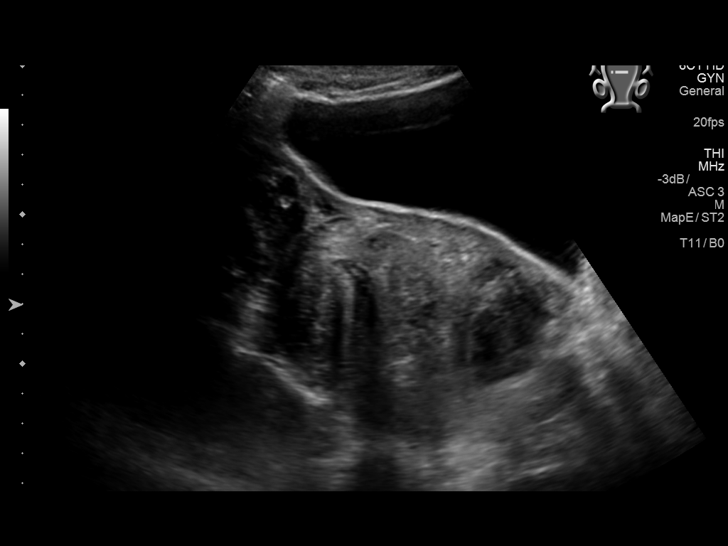
[im 31/124]
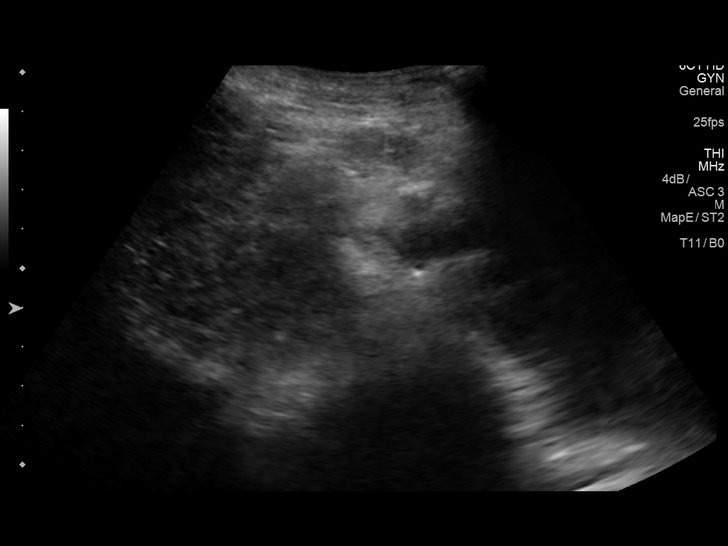
[im 42/124]
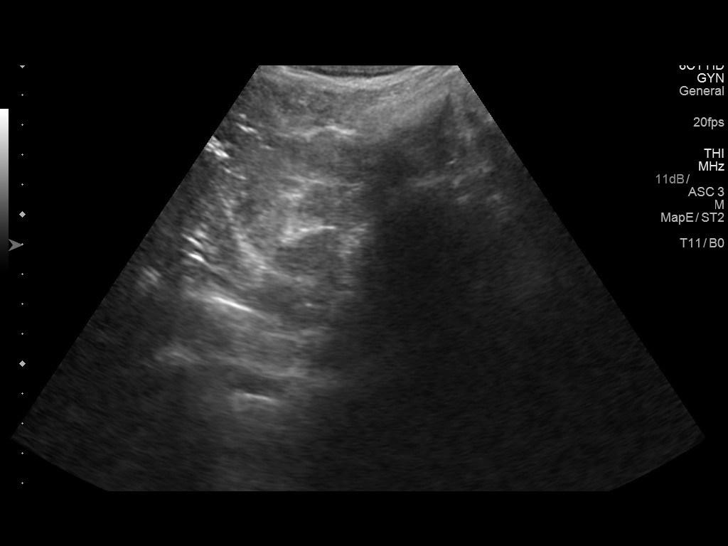
[im 52/124]
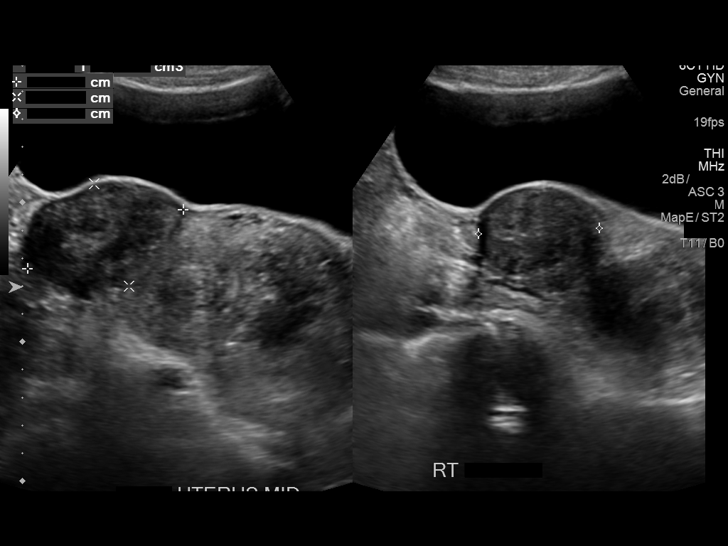
[im 62/124]
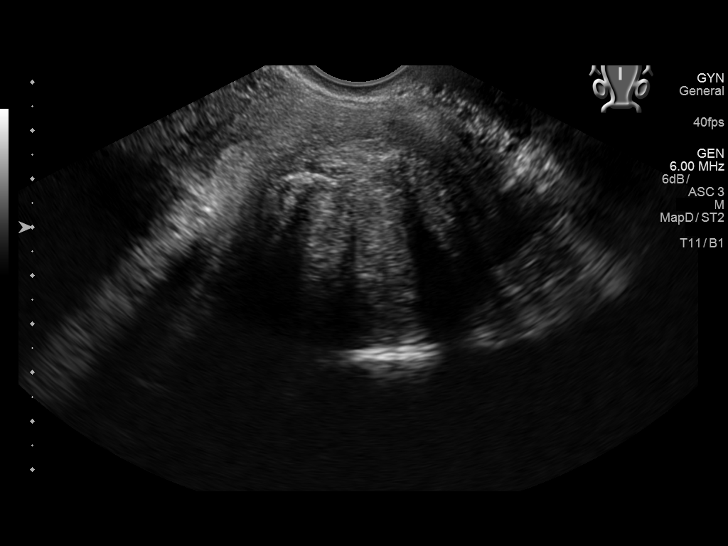
[im 72/124]
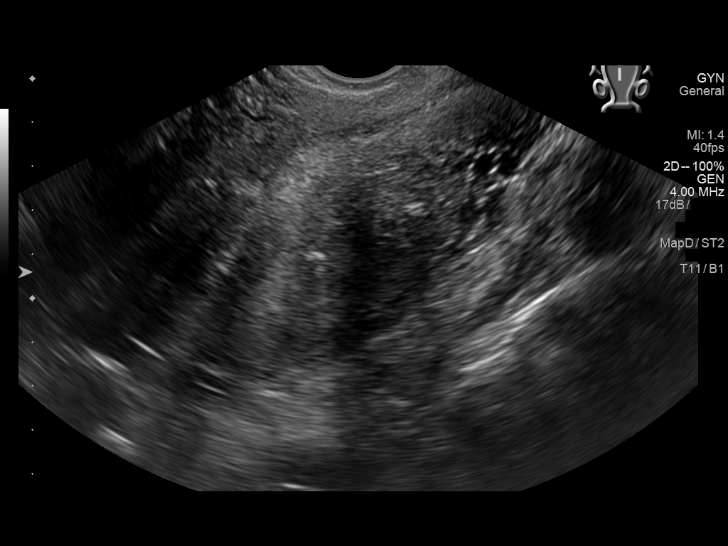
[im 83/124]
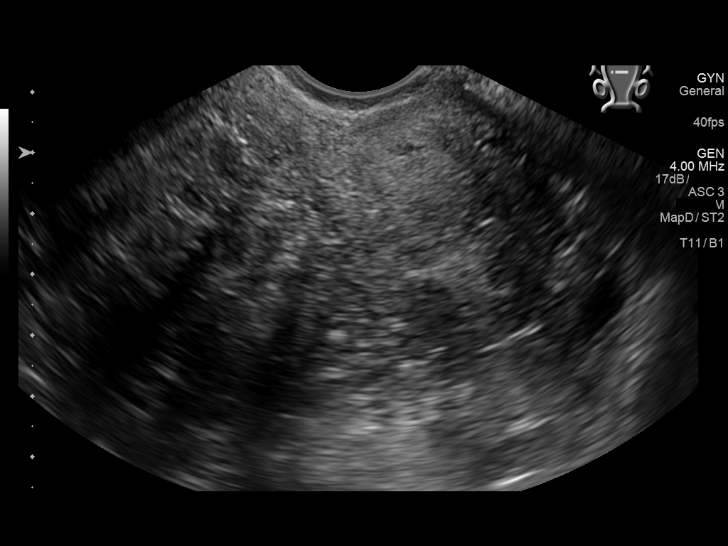
[im 93/124]
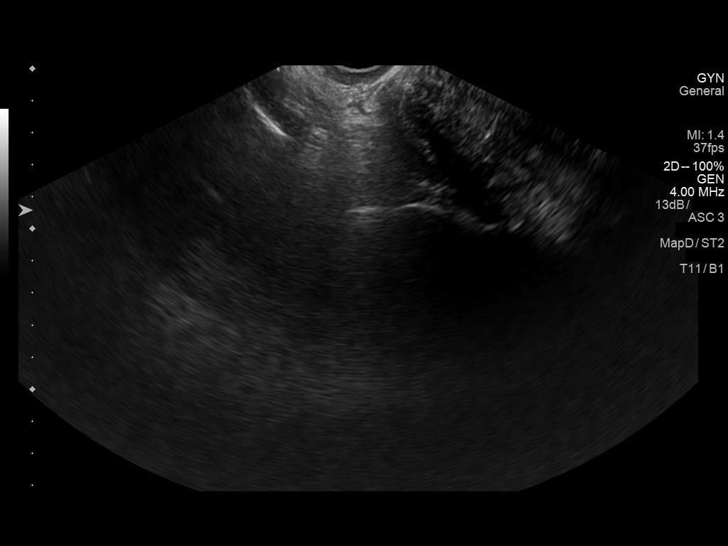
[im 103/124]
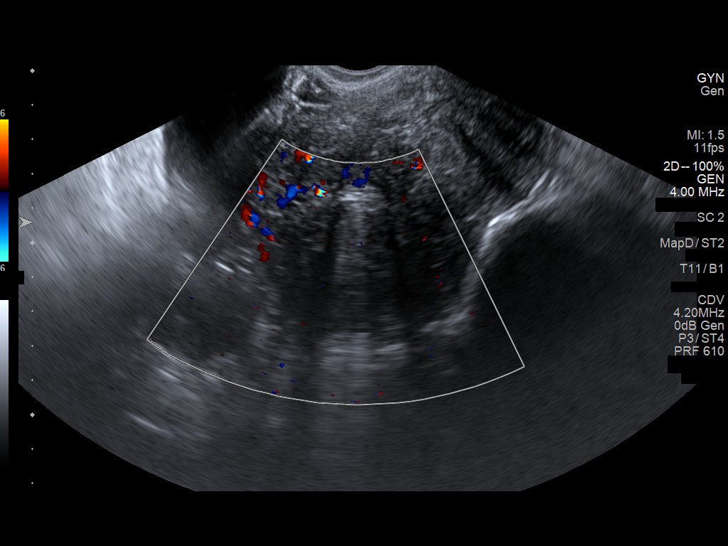
[im 113/124]
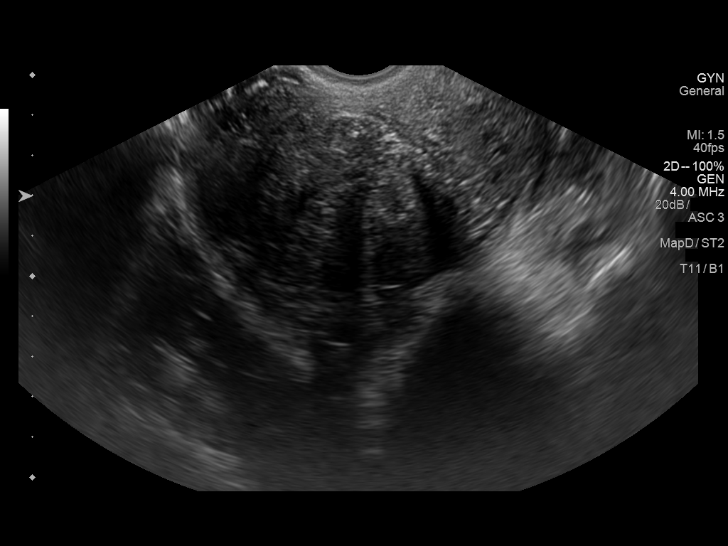
[im 124/124]
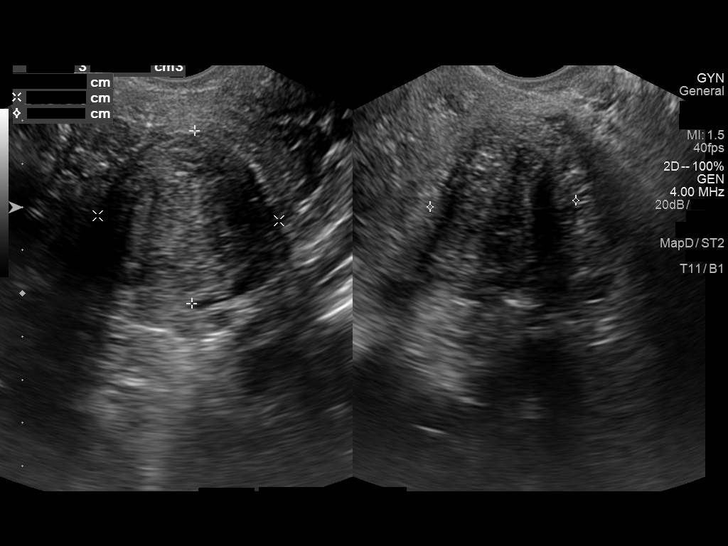

[13 of 25 positions shown; findings below may reference images not displayed]

FINDINGS: Uterus

Measurements: 14 x 7.4 x 9.9 cm. Enlarged nodular heterogeneous
appearance related to numerous uterine fibroids. Three dominant
fibroids are measured. Right fundal fibroid measures 6 x 4 x 4.3 cm.
Left mid uterine fibroid measures 7.8 x 7.0 x 6.6 cm. Left mid
lateral fibroid measures 4.0 x 4.2 x 3.4 cm

Endometrium

Thickness: 18 mm. Limited assessment because of the numerous uterine
fibroids.

Right ovary

Measurements: 4.8 x 1.8 x 3.3 cm. Right ovary is only seen
transabdominally. There is a right ovarian hypoechoic cyst with a
echogenic debris level measuring 2.9 x 2.5 x 2.6 cm compatible with
a hemorrhagic ovarian cyst.

Left ovary

Not visualized

Other findings

No abnormal free fluid.
IMPRESSION: Enlarged fibroid uterus, 3 dominant uterine fibroids are measured as
above largest measuring 7.8 cm in diameter. Overall uterine size is
14 cm in length.

Limited assessment of the endometrium because of the fibroids but
roughly measuring 18 mm in AP thickness.

2.9 cm hemorrhagic right ovarian cyst suspected

Nonvisualization of the left ovary

No free fluid

## 2018-10-21 ENCOUNTER — Telehealth: Payer: Self-pay | Admitting: Pharmacy Technician

## 2018-10-21 NOTE — Telephone Encounter (Signed)
Received 2020 proof of income.  Patient eligible to receive medication assistance at [facility] as long as eligibility requirements continue to be met.  [facility]

## 2018-12-08 IMAGING — CR DG CHEST 2V
2 series · 2 of 2 positions shown · non-contrast
Comparison: 12/28/2015

CLINICAL DATA: Shortness of Breath

EXAM:
CHEST  2 VIEW

[chest pa]
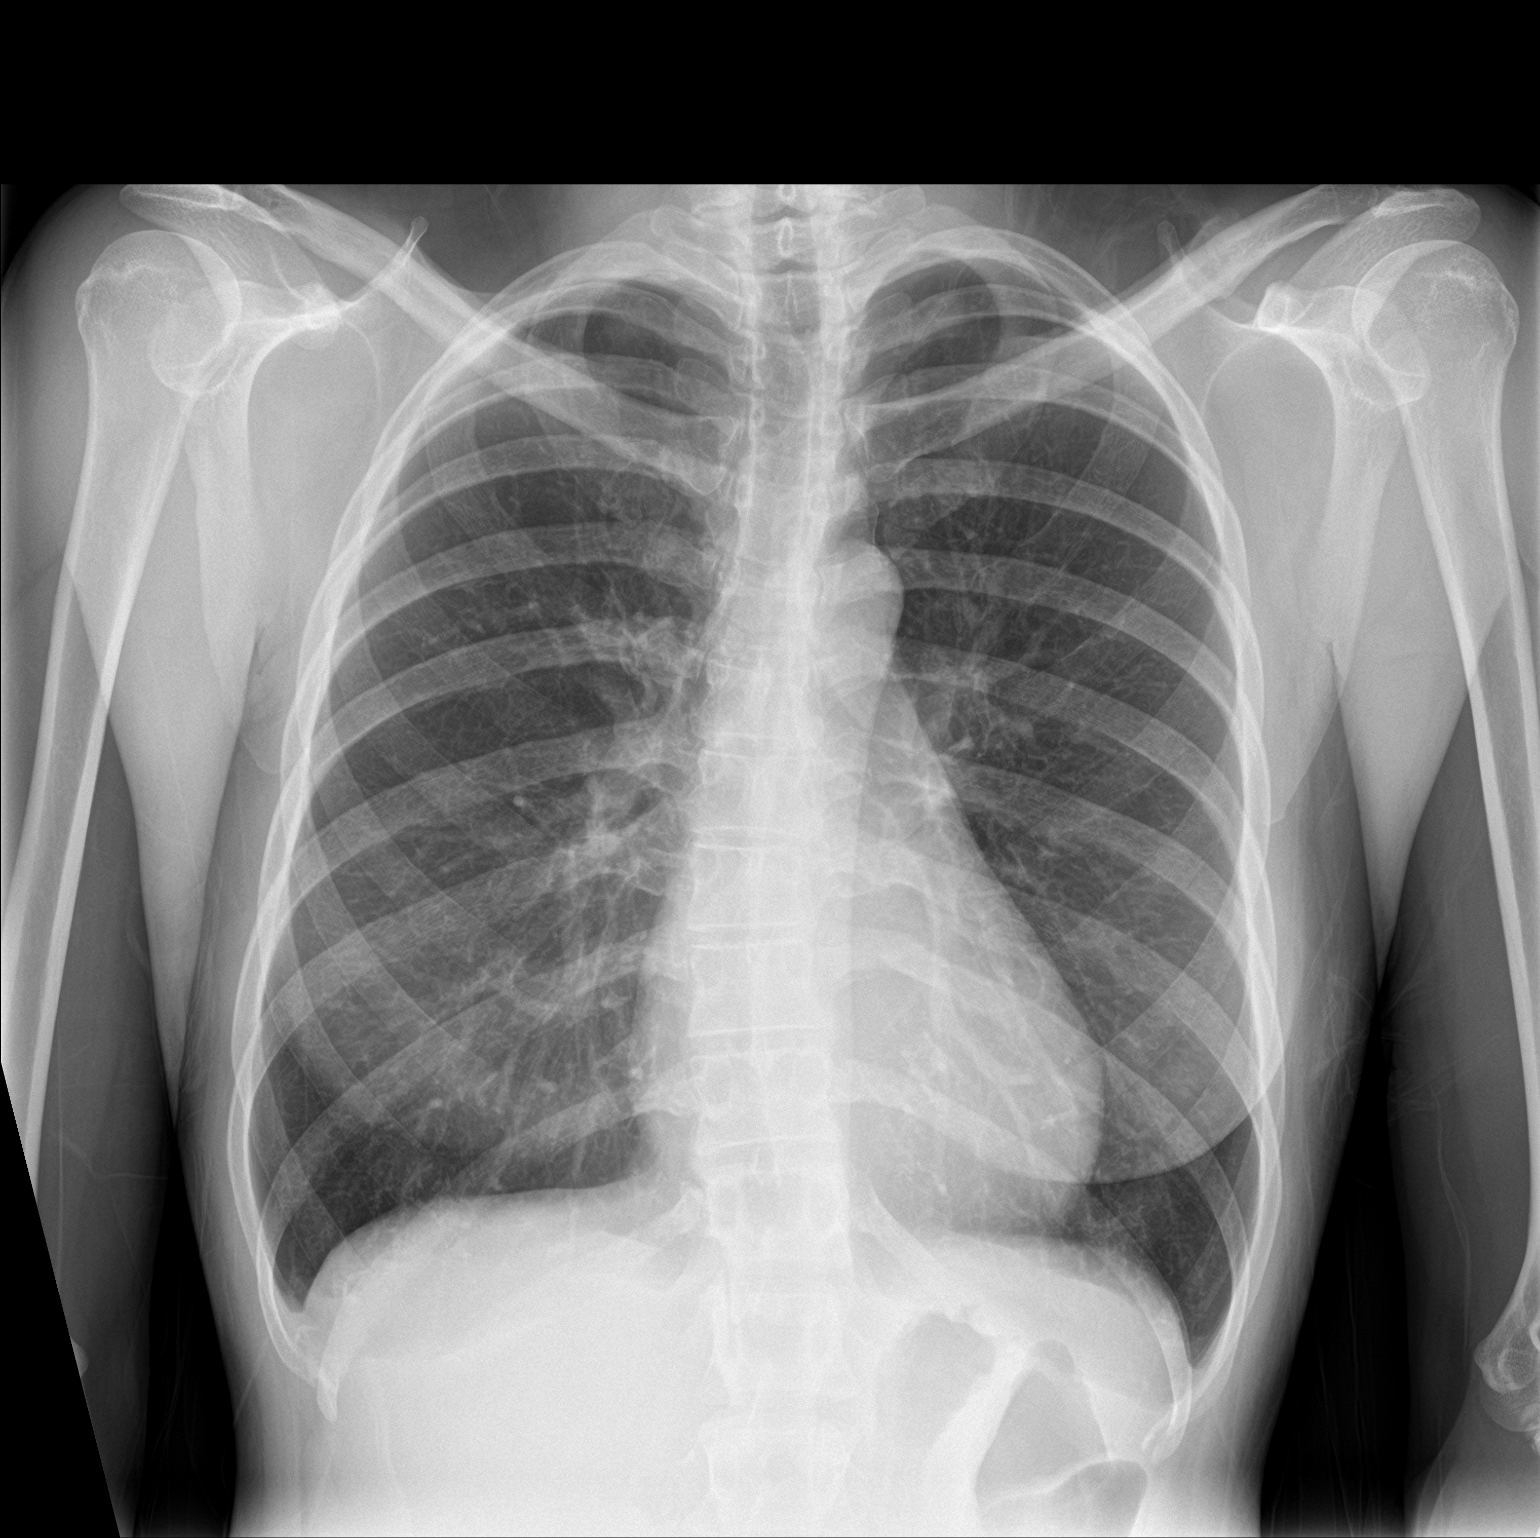

[chest lat]
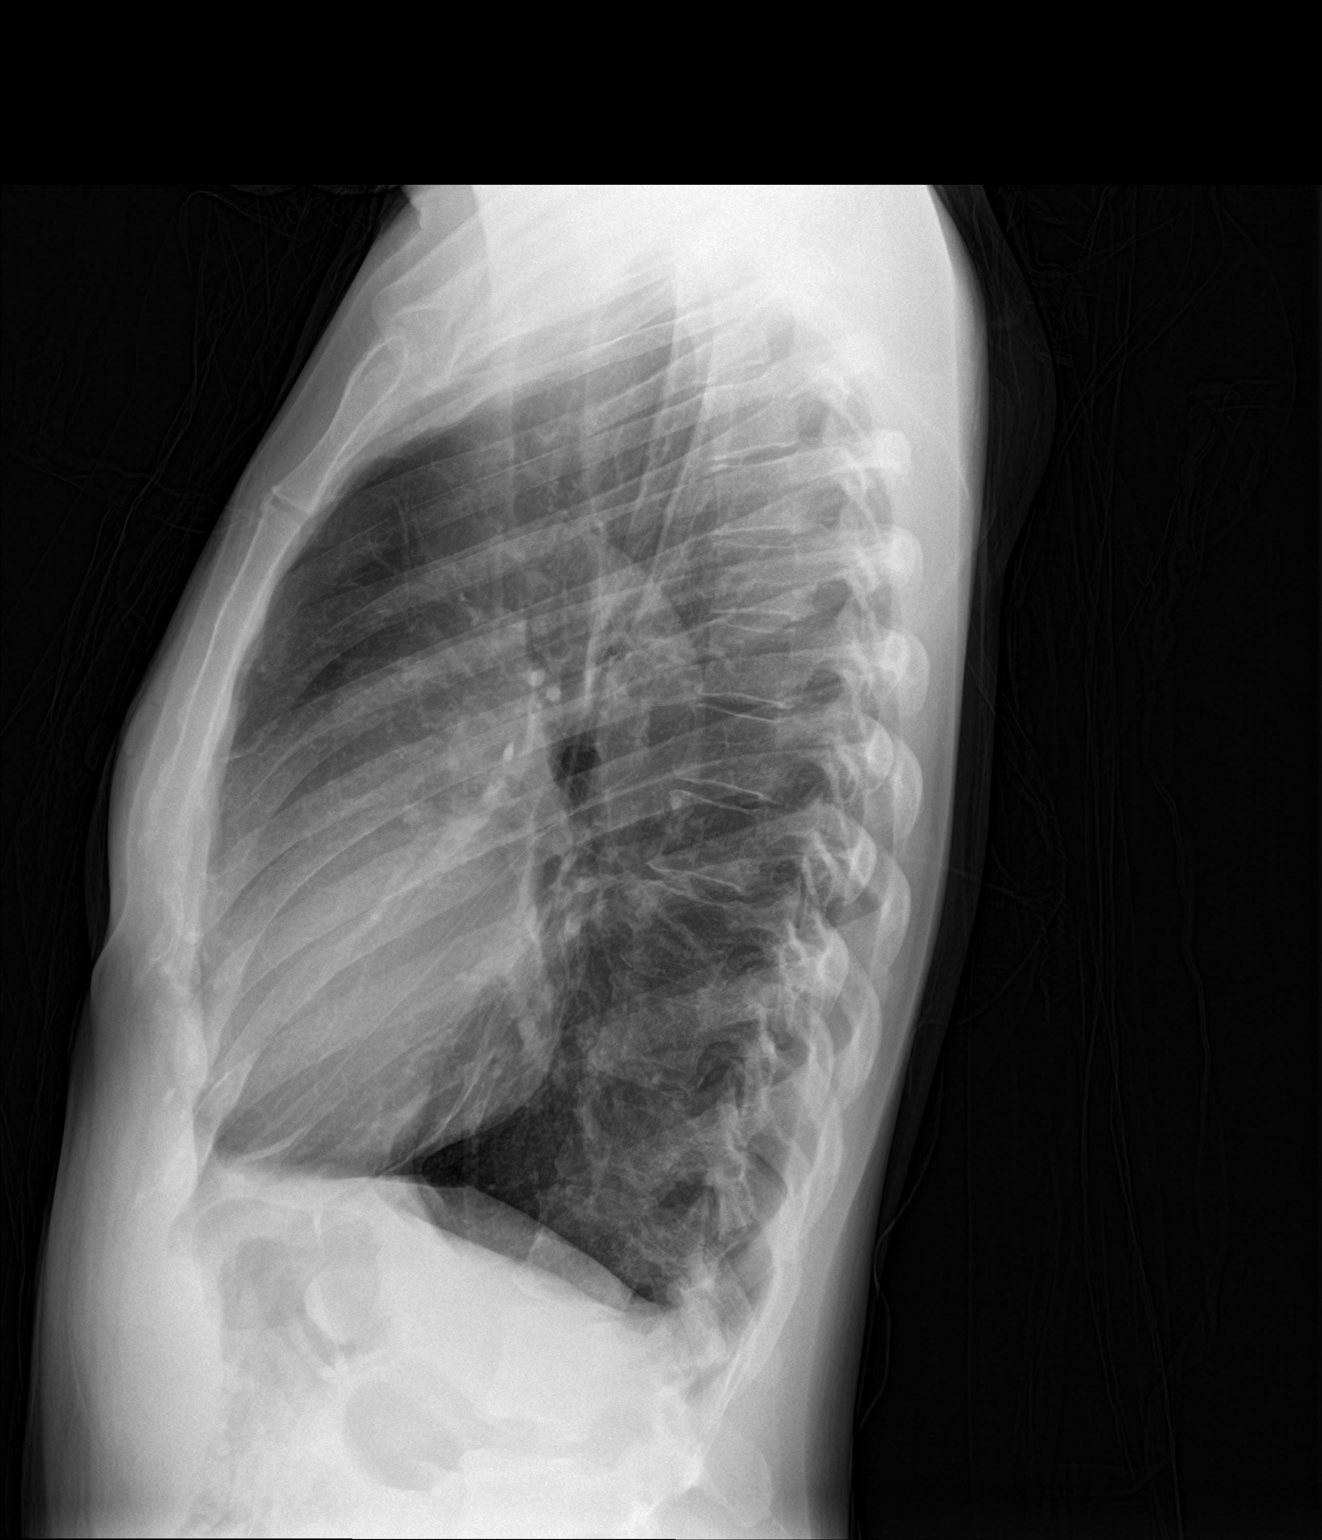

[2 of 2 positions shown; findings below may reference images not displayed]

FINDINGS: The heart size and mediastinal contours are within normal limits.
Both lungs are clear. The visualized skeletal structures are
unremarkable.
IMPRESSION: No active cardiopulmonary disease.

## 2018-12-16 ENCOUNTER — Telehealth: Payer: Self-pay

## 2018-12-16 ENCOUNTER — Ambulatory Visit: Payer: Self-pay | Admitting: Internal Medicine

## 2018-12-16 NOTE — Telephone Encounter (Signed)
Patient did not show for appt today.  Dr Mable Fill would like labs drawn before rescheduling followup appt. CMET, CBC, TSH, UA, Lipid  Records from [facility] need to be requested.  Needs appt with Julian Hy, LCSW  Left voicemail for patient to call [facility] to reschedule.

## 2018-12-22 ENCOUNTER — Telehealth: Payer: Self-pay

## 2018-12-22 NOTE — Telephone Encounter (Signed)
Tried to contact patient for followup appt.

## 2019-08-17 ENCOUNTER — Telehealth: Payer: Self-pay | Admitting: Pharmacy Technician

## 2019-08-17 NOTE — Telephone Encounter (Signed)
Received updated proof of income.  Patient eligible to receive medication assistance at [facility] until time for re-certification in [id number], and as long as eligibility requirements continue to be met.  [facility]

## 2019-08-18 ENCOUNTER — Ambulatory Visit
Admission: EM | Admit: 2019-08-18 | Discharge: 2019-08-18 | Disposition: A | Discharge status: Home / Self Care | Payer: Self-pay | Attending: Family Medicine | Admitting: Family Medicine

## 2019-08-18 ENCOUNTER — Other Ambulatory Visit: Payer: Self-pay

## 2019-08-18 DIAGNOSIS — K047 Periapical abscess without sinus: Secondary | ICD-10-CM

## 2019-08-18 MED ORDER — CEFTRIAXONE SODIUM 1 G IJ SOLR
1.0000 g | Freq: Once | INTRAMUSCULAR | Status: AC
  Administered 2019-08-18: 1 g via INTRAMUSCULAR

## 2019-08-18 MED ORDER — AMOXICILLIN 875 MG PO TABS: 875.0000 mg | ORAL_TABLET | Freq: Two times a day (BID) | ORAL | 0 refills | Status: DC

## 2019-08-18 MED ORDER — KETOROLAC TROMETHAMINE 60 MG/2ML IM SOLN
60.0000 mg | Freq: Once | INTRAMUSCULAR | Status: AC
  Administered 2019-08-18: 13:00:00 60 mg via INTRAMUSCULAR

## 2019-08-18 MED ORDER — LIDOCAINE VISCOUS HCL 2 % MT SOLN: 15.0000 mL | Freq: Four times a day (QID) | OROMUCOSAL | 0 refills | Status: DC | PRN

## 2019-08-18 NOTE — Discharge Instructions (Signed)
Take medication as prescribed. Rest. Drink plenty of fluids.   Follow up with dentist as soon as possible.   Follow up with your primary care physician this week as needed. Return to Urgent care or ER for new or worsening concerns.

## 2019-08-18 NOTE — ED Triage Notes (Signed)
Pt presents with c/o toothache on upper left for several days. Pt reports she woke up this morning with swelling to the left side of her face. Pt is concerned she may have an abscess. Pt has contacted a dentist but cannot get in until June.

## 2019-08-18 NOTE — ED Provider Notes (Signed)
[facility] ____________________________________________  Time seen: Approximately 12:44 PM  I have reviewed the triage vital signs and the nursing notes.   HISTORY  Chief Complaint Dental Pain   HPI Sherry Little is a 49 y.o. female presenting for evaluation of left frontal dental pain present for last 2 days and now with swelling. States pain without swelling was initially present, but today noticed swelling above left frontal tooth.  Pain is currently moderate.  States has had and she like this before with dental infection.  Denies fevers.  Continues eat and drink well.  Denies recent cough, sore throat or sickness.  Has been taken over-the-counter ibuprofen, Tylenol and Orajel without resolution.  Denies aggravating alleviating factors.  Denies recent antibiotic use.  Reports otherwise doing well.  States that she called her dentist and was unable to get an appointment until June.   Past Medical History:  Diagnosis Date  . Heart murmur   . Hypertension   . Migraine   . VSD (ventricular septal defect and aortic arch hypoplasia     Patient Active Problem List   Diagnosis Date Noted  . Portal hypertension ([place])   . Other diseases of stomach and duodenum   . Secondary esophageal varices without bleeding ([place])   . Loss of weight   . Iron deficiency anemia due to chronic blood loss   . Gastritis without bleeding   . Polyp of sigmoid colon   . Abdominal pain, epigastric 03/17/2016  . Symptomatic anemia 03/17/2016  . Valvular heart disease 03/17/2016  . Bradycardia 03/17/2016  . Abdominal pain 03/17/2016    Past Surgical History:  Procedure Laterality Date  . COLONOSCOPY WITH PROPOFOL N/A 03/19/2016   Procedure: COLONOSCOPY WITH PROPOFOL;  Surgeon: Lucilla Lame, MD;  Location: [facility] ENDOSCOPY;  Service: Endoscopy;  Laterality: N/A;  . ESOPHAGOGASTRODUODENOSCOPY (EGD) WITH PROPOFOL N/A 03/19/2016   Procedure: ESOPHAGOGASTRODUODENOSCOPY (EGD) WITH PROPOFOL;   Surgeon: Lucilla Lame, MD;  Location: [facility] ENDOSCOPY;  Service: Endoscopy;  Laterality: N/A;  . GANGLION CYST EXCISION  2008   left wrist  . HAND SURGERY     cyst romved from left thumb  . TUBAL LIGATION       No current facility-administered medications for this encounter.  Current Outpatient Medications:  .  cyclobenzaprine (FLEXERIL) 5 MG tablet, Take 1-2 tablets 3 times daily as needed, Disp: 20 tablet, Rfl: 0 .  feeding supplement (BOOST / RESOURCE BREEZE) LIQD, Take 1 Container by mouth 3 (three) times daily between meals., Disp: 30 Container, Rfl: 0 .  fluticasone (FLONASE) 50 MCG/ACT nasal spray, Place 2 sprays into both nostrils daily., Disp: 16 g, Rfl: 0 .  mirtazapine (REMERON) 30 MG tablet, Take 30 mg by mouth at bedtime., Disp: , Rfl:  .  OLANZapine (ZYPREXA) 20 MG tablet, Take 20 mg by mouth at bedtime., Disp: , Rfl:  .  ALPRAZolam (XANAX) 1 MG tablet, Take 1 mg by mouth daily as needed., Disp: , Rfl:  .  amoxicillin (AMOXIL) 875 MG tablet, Take 1 tablet (875 mg total) by mouth 2 (two) times daily., Disp: 20 tablet, Rfl: 0 .  ketorolac (TORADOL) 10 MG tablet, Take 1 tablet (10 mg total) by mouth every 8 (eight) hours as needed., Disp: 12 tablet, Rfl: 0 .  lidocaine (LIDODERM) 5 %, Place 1 patch onto the skin daily. Remove & Discard patch within 12 hours or as directed by MD, Disp: 30 patch, Rfl: 0 .  lidocaine (XYLOCAINE) 2 % solution, Use as directed 15 mLs  in the mouth or throat every 6 (six) hours as needed for mouth pain (hold for 2 mins to area, then spit)., Disp: 100 mL, Rfl: 0  Allergies Bee venom, Mushroom ext cmplx(shiitake-reishi-mait), Mobic [meloxicam], and Tramadol  History reviewed. No pertinent family history.  Social History Social History   Tobacco Use  . Smoking status: Current Every Day Smoker    Packs/day: 0.50    Types: Cigarettes  . Smokeless tobacco: Never Used  Substance Use Topics  . Alcohol use: No  . Drug use: Yes    Types: Marijuana     Comment: Last used around thanksgiving    Review of Systems Constitutional: No fever. ENT: No sore throat.  Positive dental pain. Cardiovascular: Denies chest pain. Respiratory: Denies shortness of breath. Gastrointestinal: No abdominal pain.  No nausea, no vomiting.  No diarrhea.  Musculoskeletal: Negative for back pain. Skin: Negative for rash.   ____________________________________________   PHYSICAL EXAM:  VITAL SIGNS: ED Triage Vitals  Enc Vitals Group     BP 08/18/19 1230 139/87     Pulse Rate 08/18/19 1230 70     Resp 08/18/19 1230 18     Temp 08/18/19 1230 98.2 F (36.8 C)     Temp Source 08/18/19 1230 Oral     SpO2 08/18/19 1230 100 %     Weight 08/18/19 1228 147 lb (66.7 kg)     Height 08/18/19 1228 5\' 2"  (1.575 m)     Head Circumference --      Peak Flow --      Pain Score 08/18/19 1228 10     Pain Loc --      Pain Edu? --      Excl. in [place]? --     Constitutional: Alert and oriented. Well appearing and in no acute distress. Eyes: Conjunctivae are normal.  ENT      Head: Normocephalic.  Mild swelling left lower face just above left upper lip, no erythema, no induration, tenderness.      Nose: No congestion      Mouth/Throat: Mucous membranes are moist.Oropharynx non-erythematous.  Widespread dental decay with multiple caries and fractured teeth, gumline erythema and swelling along left upper front tooth and incisor, no palpable abscess. Hematological/Lymphatic/Immunilogical: No cervical lymphadenopathy. Cardiovascular: Normal rate, regular rhythm. Grossly normal heart sounds.  Good peripheral circulation. Respiratory: Normal respiratory effort without tachypnea nor retractions. Breath sounds are clear and equal bilaterally. No wheezes, rales, rhonchi. Musculoskeletal: Steady gait.  Neurologic:  Normal speech and language. Skin:  Skin is warm, dry and intact. No rash noted. Psychiatric: Mood and affect are normal. Speech and behavior are normal. Patient  exhibits appropriate insight and judgment   ___________________________________________   LABS (all labs ordered are listed, but only abnormal results are displayed)  Labs Reviewed - No data to display ____________________________________________   PROCEDURES Procedures     INITIAL IMPRESSION / ASSESSMENT AND PLAN / ED COURSE  Pertinent labs & imaging results that were available during my care of the patient were reviewed by me and considered in my medical decision making (see chart for details).  Well-appearing patient.  Dental infection.  1 g IM Rocephin given once in urgent care.  Will treat with oral amoxicillin.  Patient reports she has tolerated Toradol well in the past, 60 mg IM Toradol given once in urgent care.  Encouraged to follow-up with dentist as soon as possible.  Prn viscous Lidocaine.  Discussed indication, risks and benefits of medications with patient. Discussed follow up  and return parameters including no resolution or any worsening concerns. Patient verbalized understanding and agreed to plan.   ____________________________________________   FINAL CLINICAL IMPRESSION(S) / ED DIAGNOSES  Final diagnoses:  Dental infection     ED Discharge Orders         Ordered    amoxicillin (AMOXIL) 875 MG tablet  2 times daily     08/18/19 1245    lidocaine (XYLOCAINE) 2 % solution  Every 6 hours PRN     08/18/19 1245           Note: This dictation was prepared with Dragon dictation along with smaller phrase technology. Any transcriptional errors that result from this process are unintentional.         Marylene Land, NP 08/18/19 1320

## 2019-11-06 ENCOUNTER — Other Ambulatory Visit: Payer: Self-pay

## 2019-11-06 ENCOUNTER — Emergency Department
Admission: EM | Admit: 2019-11-06 | Discharge: 2019-11-07 | Disposition: A | Discharge status: Home / Self Care | Payer: Self-pay | Attending: Emergency Medicine | Admitting: Emergency Medicine

## 2019-11-06 ENCOUNTER — Encounter: Payer: Self-pay | Admitting: Emergency Medicine

## 2019-11-06 DIAGNOSIS — M545 Low back pain: Secondary | ICD-10-CM | POA: Insufficient documentation

## 2019-11-06 DIAGNOSIS — Z5321 Procedure and treatment not carried out due to patient leaving prior to being seen by health care provider: Secondary | ICD-10-CM | POA: Insufficient documentation

## 2019-11-06 NOTE — ED Triage Notes (Signed)
Patient with complaint of lower back pain times three days.

## 2019-11-06 NOTE — ED Notes (Signed)
Patient did not answer.  

## 2019-11-07 ENCOUNTER — Other Ambulatory Visit: Payer: Self-pay

## 2019-11-07 ENCOUNTER — Encounter: Payer: Self-pay | Admitting: Emergency Medicine

## 2019-11-07 ENCOUNTER — Emergency Department
Admission: EM | Admit: 2019-11-07 | Discharge: 2019-11-07 | Disposition: A | Discharge status: Home / Self Care | Payer: Self-pay | Attending: Emergency Medicine | Admitting: Emergency Medicine

## 2019-11-07 DIAGNOSIS — Z5321 Procedure and treatment not carried out due to patient leaving prior to being seen by health care provider: Secondary | ICD-10-CM | POA: Insufficient documentation

## 2019-11-07 DIAGNOSIS — M545 Low back pain: Secondary | ICD-10-CM | POA: Insufficient documentation

## 2019-11-07 NOTE — ED Triage Notes (Signed)
Pt to ED again after LWBS yesterday for lower back pain x 4 days at this time.   Pt in NAD at this time, playing on cell phone in triage.

## 2019-12-08 ENCOUNTER — Other Ambulatory Visit: Payer: Self-pay | Admitting: Nurse Practitioner

## 2020-03-22 ENCOUNTER — Other Ambulatory Visit: Payer: Self-pay | Admitting: Psychiatry

## 2020-04-06 ENCOUNTER — Ambulatory Visit
Admission: EM | Admit: 2020-04-06 | Discharge: 2020-04-06 | Disposition: A | Discharge status: Home / Self Care | Payer: Self-pay | Attending: Physician Assistant | Admitting: Physician Assistant

## 2020-04-06 ENCOUNTER — Encounter: Payer: Self-pay | Admitting: Emergency Medicine

## 2020-04-06 ENCOUNTER — Other Ambulatory Visit: Payer: Self-pay

## 2020-04-06 DIAGNOSIS — R059 Cough, unspecified: Secondary | ICD-10-CM | POA: Insufficient documentation

## 2020-04-06 DIAGNOSIS — U071 COVID-19: Secondary | ICD-10-CM | POA: Insufficient documentation

## 2020-04-06 DIAGNOSIS — R509 Fever, unspecified: Secondary | ICD-10-CM | POA: Insufficient documentation

## 2020-04-06 LAB — RESP PANEL BY RT-PCR (FLU A&B, COVID) ARPGX2
Influenza A by PCR: NEGATIVE
Influenza B by PCR: NEGATIVE
SARS Coronavirus 2 by RT PCR: POSITIVE — AB

## 2020-04-06 MED ORDER — BENZONATATE 100 MG PO CAPS: 200.0000 mg | ORAL_CAPSULE | Freq: Three times a day (TID) | ORAL | 0 refills | Status: DC | PRN

## 2020-04-06 NOTE — Discharge Instructions (Addendum)
The Covid test is positive.  You have to isolate 10 days from your symptom onset.  I will give you information to the infusion center for consideration of monoclonal antibody therapy.  The list is very long, but you are high risk since you have been on vaccinated and have other health conditions.  Hopefully someone calls over the next couple of days.  If symptomatic, go home and rest. Push fluids. Take Tylenol as needed for discomfort. Gargle warm salt water. Throat lozenges. Take Mucinex DM or Robitussin for cough. Humidifier in bedroom to ease coughing. Warm showers. Also review the COVID handout for more information.  COVID-19 INFECTION: The incubation period of COVID-19 is approximately 14 days after exposure, with most symptoms developing in roughly 4-5 days. Symptoms may range in severity from mild to critically severe. Roughly 80% of those infected will have mild symptoms. People of any age may become infected with COVID-19 and have the ability to transmit the virus. The most common symptoms include: fever, fatigue, cough, body aches, headaches, sore throat, nasal congestion, shortness of breath, nausea, vomiting, diarrhea, changes in smell and/or taste.    COURSE OF ILLNESS Some patients may begin with mild disease which can progress quickly into critical symptoms. If your symptoms are worsening please call ahead to the Emergency Department and proceed there for further treatment. Recovery time appears to be roughly 1-2 weeks for mild symptoms and 3-6 weeks for severe disease.   GO IMMEDIATELY TO ER FOR FEVER YOU ARE UNABLE TO GET DOWN WITH TYLENOL, BREATHING PROBLEMS, CHEST PAIN, FATIGUE, LETHARGY, INABILITY TO EAT OR DRINK, ETC  QUARANTINE AND ISOLATION: To help decrease the spread of COVID-19 please remain isolated if you have COVID infection or are highly suspected to have COVID infection. This means -stay home and isolate to one room in the home if you live with others. Do not share a bed or  bathroom with others while ill, sanitize and wipe down all countertops and keep common areas clean and disinfected. You may discontinue isolation if you have a mild case and are asymptomatic 10 days after symptom onset as long as you have been fever free >24 hours without having to take Motrin or Tylenol. If your case is more severe (meaning you develop pneumonia or are admitted in the hospital), you may have to isolate longer.   If you have been in close contact (within 6 feet) of someone diagnosed with COVID 19, you are advised to quarantine in your home for 14 days as symptoms can develop anywhere from 2-14 days after exposure to the virus. If you develop symptoms, you  must isolate.  Most current guidelines for COVID after exposure -isolate 10 days if you ARE NOT tested for COVID as long as symptoms do not develop -isolate 7 days if you are tested and remain asymptomatic -You do not necessarily need to be tested for COVID if you have + exposure and        develop   symptoms. Just isolate at home x10 days from symptom onset During this global pandemic, CDC advises to practice social distancing, try to stay at least 84ft away from others at all times. Wear a face covering. Wash and sanitize your hands regularly and avoid going anywhere that is not necessary.  KEEP IN MIND THAT THE COVID TEST IS NOT 100% ACCURATE AND YOU SHOULD STILL DO EVERYTHING TO PREVENT POTENTIAL SPREAD OF VIRUS TO OTHERS (WEAR MASK, WEAR GLOVES, WASH HANDS AND SANITIZE REGULARLY). IF INITIAL TEST  IS NEGATIVE, THIS MAY NOT MEAN YOU ARE DEFINITELY NEGATIVE. MOST ACCURATE TESTING IS DONE 5-7 DAYS AFTER EXPOSURE.   It is not advised by CDC to get re-tested after receiving a positive COVID test since you can still test positive for weeks to months after you have already cleared the virus.   *If you have not been vaccinated for COVID, I strongly suggest you consider getting vaccinated as long as there are no contraindications.

## 2020-04-06 NOTE — ED Provider Notes (Signed)
[facility]    CSN: [id number] Arrival date & time: 04/06/20  [id number]      History   Chief Complaint Chief Complaint  Patient presents with   Generalized Body Aches   Chills   Covid Exposure    HPI Sherry Little is a 49 y.o. female presenting for fatigue, body aches, chills, fevers, congestion, and cough starting yesterday. Patient reports COVID exposure through her daughter and granddaughter. Patient has not been vaccinated for COVID 19.  Patient denies any sore throat, chest pain, breathing difficulty, abdominal pain, N/V/D.  She has not taken DayQuil several hours ago.  PMH significant for hypertension and valvular heart disease.  Patient has no other complaints or concerns at this time.  HPI  Past Medical History:  Diagnosis Date   Heart murmur    Hypertension    Migraine    VSD (ventricular septal defect and aortic arch hypoplasia     Patient Active Problem List   Diagnosis Date Noted   Portal hypertension ([place])    Other diseases of stomach and duodenum    Secondary esophageal varices without bleeding (H[facility])    Loss of weight    Iron deficiency anemia due to chronic blood loss    Gastritis without bleeding    Polyp of sigmoid colon    Abdominal pain, epigastric 03/17/2016   Symptomatic anemia 03/17/2016   Valvular heart disease 03/17/2016   Bradycardia 03/17/2016   Abdominal pain 03/17/2016    Past Surgical History:  Procedure Laterality Date   COLONOSCOPY WITH PROPOFOL N/A 03/19/2016   Procedure: COLONOSCOPY WITH PROPOFOL;  Surgeon: Lucilla Lame, MD;  Location: [facility] ENDOSCOPY;  Service: Endoscopy;  Laterality: N/A;   ESOPHAGOGASTRODUODENOSCOPY (EGD) WITH PROPOFOL N/A 03/19/2016   Procedure: ESOPHAGOGASTRODUODENOSCOPY (EGD) WITH PROPOFOL;  Surgeon: Lucilla Lame, MD;  Location: [facility] ENDOSCOPY;  Service: Endoscopy;  Laterality: N/A;   GANGLION CYST EXCISION  2008   left wrist   HAND SURGERY     cyst romved from left thumb    TUBAL LIGATION      OB History   No obstetric history on file.      Home Medications    Prior to Admission medications   Medication Sig Start Date End Date Taking? Authorizing Provider  benzonatate (TESSALON) 100 MG capsule Take 2 capsules (200 mg total) by mouth 3 (three) times daily as needed for cough. 04/06/20  Yes Laurene Footman B, PA-C  mirtazapine (REMERON) 30 MG tablet Take 30 mg by mouth at bedtime.   Yes [provider]  OLANZapine (ZYPREXA) 20 MG tablet Take 20 mg by mouth at bedtime.   Yes [provider]  ALPRAZolam Duanne Moron) 1 MG tablet Take 1 mg by mouth daily as needed. 08/11/19   [provider]  amoxicillin (AMOXIL) 875 MG tablet Take 1 tablet (875 mg total) by mouth 2 (two) times daily. 08/18/19   Marylene Land, NP  cyclobenzaprine (FLEXERIL) 5 MG tablet Take 1-2 tablets 3 times daily as needed 05/05/18   Laban Emperor, PA-C  feeding supplement (BOOST / RESOURCE BREEZE) LIQD Take 1 Container by mouth 3 (three) times daily between meals. 03/19/16   Epifanio Lesches, MD  fluticasone (FLONASE) 50 MCG/ACT nasal spray Place 2 sprays into both nostrils daily. 12/28/15   Menshew, Dannielle Karvonen, PA-C  ketorolac (TORADOL) 10 MG tablet Take 1 tablet (10 mg total) by mouth every 8 (eight) hours as needed. 05/05/18   Laban Emperor, PA-C  lidocaine (LIDODERM) 5 % Place 1 patch onto  the skin daily. Remove & Discard patch within 12 hours or as directed by MD 05/05/18   Enid DerryWagner, Ashley, PA-C  lidocaine (XYLOCAINE) 2 % solution Use as directed 15 mLs in the mouth or throat every 6 (six) hours as needed for mouth pain (hold for 2 mins to area, then spit). 08/18/19   Renford DillsMiller, Lindsey, NP    Family History History reviewed. No pertinent family history.  Social History Social History   Tobacco Use   Smoking status: Current Every Day Smoker    Packs/day: 0.50    Types: Cigarettes   Smokeless tobacco: Never Used  Vaping Use   Vaping Use: Never used   Substance Use Topics   Alcohol use: No   Drug use: Yes    Types: Marijuana     Allergies   Bee venom, Mushroom ext cmplx(shiitake-reishi-mait), Mobic [meloxicam], and Tramadol   Review of Systems Review of Systems  Constitutional: Positive for chills, fatigue and fever. Negative for diaphoresis.  HENT: Positive for congestion. Negative for ear pain, rhinorrhea, sinus pressure, sinus pain and sore throat.   Respiratory: Positive for cough. Negative for shortness of breath and wheezing.   Cardiovascular: Negative for chest pain.  Gastrointestinal: Negative for abdominal pain, nausea and vomiting.  Musculoskeletal: Positive for myalgias. Negative for arthralgias.  Skin: Negative for rash.  Neurological: Positive for headaches. Negative for weakness.  Hematological: Negative for adenopathy.     Physical Exam Triage Vital Signs ED Triage Vitals  Enc Vitals Group     BP 04/06/20 0838 (!) 149/92     Pulse Rate 04/06/20 0838 82     Resp 04/06/20 0838 18     Temp 04/06/20 0838 (!) 102.8 F (39.3 C)     Temp Source 04/06/20 0838 Oral     SpO2 04/06/20 0838 100 %     Weight 04/06/20 0834 150 lb (68 kg)     Height 04/06/20 0834 5\' 2"  (1.575 m)     Head Circumference --      Peak Flow --      Pain Score 04/06/20 0834 8     Pain Loc --      Pain Edu? --      Excl. in [place]? --    No data found.  Updated Vital Signs BP (!) 149/92 (BP Location: Right Arm)    Pulse 82    Temp (!) 102.8 F (39.3 C) (Oral)    Resp 18    Ht 5\' 2"  (1.575 m)    Wt 150 lb (68 kg)    LMP 03/20/2020    SpO2 100%    BMI 27.44 kg/m       Physical Exam Vitals and nursing note reviewed.  Constitutional:      General: She is not in acute distress.    Appearance: Normal appearance. She is ill-appearing. She is not toxic-appearing.     Comments: Patient is ill-appearing.  Appears fatigued.  She is lying on the exam table covered in a blanket with a hoodie over her head.  HENT:     Head: Normocephalic  and atraumatic.     Nose: Congestion and rhinorrhea (slight clear drainage ) present.     Mouth/Throat:     Mouth: Mucous membranes are moist.     Pharynx: Oropharynx is clear.  Eyes:     General: No scleral icterus.       Right eye: No discharge.        Left eye: No discharge.  Conjunctiva/sclera: Conjunctivae normal.  Cardiovascular:     Rate and Rhythm: Normal rate and regular rhythm.     Heart sounds: Normal heart sounds.  Pulmonary:     Effort: Pulmonary effort is normal. No respiratory distress.     Breath sounds: Normal breath sounds. No wheezing, rhonchi or rales.  Musculoskeletal:     Cervical back: Neck supple.  Skin:    General: Skin is dry.  Neurological:     General: No focal deficit present.     Mental Status: She is alert. Mental status is at baseline.     Motor: No weakness.     Gait: Gait normal.  Psychiatric:        Mood and Affect: Mood normal.        Behavior: Behavior normal.        Thought Content: Thought content normal.      UC Treatments / Results  Labs (all labs ordered are listed, but only abnormal results are displayed) Labs Reviewed  RESP PANEL BY RT-PCR (FLU A&B, COVID) ARPGX2 - Abnormal; Notable for the following components:      Result Value   SARS Coronavirus 2 by RT PCR POSITIVE (*)    All other components within normal limits    EKG   Radiology No results found.  Procedures Procedures (including critical care time)  Medications Ordered in UC Medications - No data to display  Initial Impression / Assessment and Plan / UC Course  I have reviewed the triage vital signs and the nursing notes.  Pertinent labs & imaging results that were available during my care of the patient were reviewed by me and considered in my medical decision making (see chart for details).   49 year old unvaccinated African-American female presenting for fever, fatigue, chills, cough x1 day following exposure to COVID-19.  Temperature elevated at  102.8 degrees in clinic.  Patient given Motrin.  BP elevated at 149/92.  She is ill-appearing but nontoxic.  She is a little congested with clear nasal drainage.  Chest is clear to auscultation.  Respiratory panel obtained.  Respiratory panel positive for COVID-19.  CDC guidance, isolation protocol and ED precautions reviewed with patient.  Prescribed Tessalon Perles for cough.  Advised Tylenol Motrin for body aches.  Advised increasing rest and fluids.  Explained to patient that care is supportive.  I will consult the infusion center for consideration of MAB therapy.  Patient is high risk.  ED precautions thoroughly reviewed with patient.   Final Clinical Impressions(s) / UC Diagnoses   Final diagnoses:  COVID-19  Fever, unspecified  Cough     Discharge Instructions     The Covid test is positive.  You have to isolate 10 days from your symptom onset.  I will give you information to the infusion center for consideration of monoclonal antibody therapy.  The list is very long, but you are high risk since you have been on vaccinated and have other health conditions.  Hopefully someone calls over the next couple of days.  If symptomatic, go home and rest. Push fluids. Take Tylenol as needed for discomfort. Gargle warm salt water. Throat lozenges. Take Mucinex DM or Robitussin for cough. Humidifier in bedroom to ease coughing. Warm showers. Also review the COVID handout for more information.  COVID-19 INFECTION: The incubation period of COVID-19 is approximately 14 days after exposure, with most symptoms developing in roughly 4-5 days. Symptoms may range in severity from mild to critically severe. Roughly 80% of those infected will have  mild symptoms. People of any age may become infected with COVID-19 and have the ability to transmit the virus. The most common symptoms include: fever, fatigue, cough, body aches, headaches, sore throat, nasal congestion, shortness of breath, nausea, vomiting,  diarrhea, changes in smell and/or taste.    COURSE OF ILLNESS Some patients may begin with mild disease which can progress quickly into critical symptoms. If your symptoms are worsening please call ahead to the Emergency Department and proceed there for further treatment. Recovery time appears to be roughly 1-2 weeks for mild symptoms and 3-6 weeks for severe disease.   GO IMMEDIATELY TO ER FOR FEVER YOU ARE UNABLE TO GET DOWN WITH TYLENOL, BREATHING PROBLEMS, CHEST PAIN, FATIGUE, LETHARGY, INABILITY TO EAT OR DRINK, ETC  QUARANTINE AND ISOLATION: To help decrease the spread of COVID-19 please remain isolated if you have COVID infection or are highly suspected to have COVID infection. This means -stay home and isolate to one room in the home if you live with others. Do not share a bed or bathroom with others while ill, sanitize and wipe down all countertops and keep common areas clean and disinfected. You may discontinue isolation if you have a mild case and are asymptomatic 10 days after symptom onset as long as you have been fever free >24 hours without having to take Motrin or Tylenol. If your case is more severe (meaning you develop pneumonia or are admitted in the hospital), you may have to isolate longer.   If you have been in close contact (within 6 feet) of someone diagnosed with COVID 19, you are advised to quarantine in your home for 14 days as symptoms can develop anywhere from 2-14 days after exposure to the virus. If you develop symptoms, you  must isolate.  Most current guidelines for COVID after exposure -isolate 10 days if you ARE NOT tested for COVID as long as symptoms do not develop -isolate 7 days if you are tested and remain asymptomatic -You do not necessarily need to be tested for COVID if you have + exposure and        develop   symptoms. Just isolate at home x10 days from symptom onset During this global pandemic, CDC advises to practice social distancing, try to stay at least  24ft away from others at all times. Wear a face covering. Wash and sanitize your hands regularly and avoid going anywhere that is not necessary.  KEEP IN MIND THAT THE COVID TEST IS NOT 100% ACCURATE AND YOU SHOULD STILL DO EVERYTHING TO PREVENT POTENTIAL SPREAD OF VIRUS TO OTHERS (WEAR MASK, WEAR GLOVES, [place] HANDS AND SANITIZE REGULARLY). IF INITIAL TEST IS NEGATIVE, THIS MAY NOT MEAN YOU ARE DEFINITELY NEGATIVE. MOST ACCURATE TESTING IS DONE 5-7 DAYS AFTER EXPOSURE.   It is not advised by CDC to get re-tested after receiving a positive COVID test since you can still test positive for weeks to months after you have already cleared the virus.   *If you have not been vaccinated for COVID, I strongly suggest you consider getting vaccinated as long as there are no contraindications.      ED Prescriptions    Medication Sig Dispense Auth. Provider   benzonatate (TESSALON) 100 MG capsule Take 2 capsules (200 mg total) by mouth 3 (three) times daily as needed for cough. 21 capsule Danton Clap, PA-C     PDMP not reviewed this encounter.   Danton Clap, PA-C 04/06/20 [phone]

## 2020-04-06 NOTE — ED Triage Notes (Signed)
Patient c/o body aches, chills, cough, positive covid exposure. Symptoms started yesterday.

## 2020-04-20 ENCOUNTER — Encounter: Payer: Self-pay | Admitting: Emergency Medicine

## 2020-04-20 ENCOUNTER — Ambulatory Visit
Admission: EM | Admit: 2020-04-20 | Discharge: 2020-04-20 | Disposition: A | Discharge status: Home / Self Care | Payer: Self-pay | Attending: Sports Medicine | Admitting: Sports Medicine

## 2020-04-20 ENCOUNTER — Other Ambulatory Visit: Payer: Self-pay

## 2020-04-20 DIAGNOSIS — H9201 Otalgia, right ear: Secondary | ICD-10-CM

## 2020-04-20 DIAGNOSIS — R059 Cough, unspecified: Secondary | ICD-10-CM

## 2020-04-20 DIAGNOSIS — H6501 Acute serous otitis media, right ear: Secondary | ICD-10-CM

## 2020-04-20 MED ORDER — AMOXICILLIN 875 MG PO TABS: 875.0000 mg | ORAL_TABLET | Freq: Two times a day (BID) | ORAL | 0 refills | Status: DC

## 2020-04-20 NOTE — Discharge Instructions (Addendum)
No need to COVID test her as she recently was positive and went through the quarantine process. She does have a otitis media issue on examination.  We will treat accordingly. Plenty of fluids, plenty of rest, Tylenol or Motrin for fever discomfort.  She has her inhaler for any cough or wheeze.  She can use over-the-counter meds such as Mucinex or Delsym or Robitussin. Gave her a work note keep her out of work today and tomorrow and she can return on Saturday, January 15.  Her next shift would be Monday, January 17. Follow-up here as needed.

## 2020-04-20 NOTE — ED Triage Notes (Signed)
Patient in today c/o right ear pain x 4 days and continued nasal congestion and cough from 04/06/20 [facility] visit where patient tested positive for covid. Patient has been using OTC ear drops without relief.

## 2020-04-20 NOTE — ED Provider Notes (Signed)
[facility]    CSN: [id number] Arrival date & time: 04/20/20  0800      History   Chief Complaint Chief Complaint  Patient presents with  . Otalgia    right  . Nasal Congestion    HPI Sherry Little is a 50 y.o. female.   Pleasant 50 year old female who presents for evaluation of 4 days of right ear pain.  She also has some congestion and cough.  She was seen on December 30 and tested positive for COVID.  She went through the quarantine protocol.  She has an albuterol inhaler which she is still using post COVID positive test.  She has been working as a Radiation protection practitioner over at [employer]  She was sent home from work last evening.  She also complains of some body aches.  No chest pain or shortness of breath.  No abdominal pain or urinary symptoms.  No significant fevers or shakes but she does have some occasional chills.  No nausea vomiting or diarrhea.  She has not been vaccinated against COVID or influenza.  No red flag signs or symptoms offered by the patient on history.     Past Medical History:  Diagnosis Date  . Heart murmur   . Hypertension   . Migraine   . VSD (ventricular septal defect and aortic arch hypoplasia     Patient Active Problem List   Diagnosis Date Noted  . Portal hypertension ([place])   . Other diseases of stomach and duodenum   . Secondary esophageal varices without bleeding ([place])   . Loss of weight   . Iron deficiency anemia due to chronic blood loss   . Gastritis without bleeding   . Polyp of sigmoid colon   . Abdominal pain, epigastric 03/17/2016  . Symptomatic anemia 03/17/2016  . Valvular heart disease 03/17/2016  . Bradycardia 03/17/2016  . Abdominal pain 03/17/2016    Past Surgical History:  Procedure Laterality Date  . COLONOSCOPY WITH PROPOFOL N/A 03/19/2016   Procedure: COLONOSCOPY WITH PROPOFOL;  Surgeon: Lucilla Lame, MD;  Location: [facility];  Service: Endoscopy;  Laterality: N/A;  . ESOPHAGOGASTRODUODENOSCOPY (EGD) WITH PROPOFOL  N/A 03/19/2016   Procedure: ESOPHAGOGASTRODUODENOSCOPY (EGD) WITH PROPOFOL;  Surgeon: Lucilla Lame, MD;  Location: [facility];  Service: Endoscopy;  Laterality: N/A;  . GANGLION CYST EXCISION  2008   left wrist  . HAND SURGERY     cyst romved from left thumb  . TUBAL LIGATION      OB History   No obstetric history on file.      Home Medications    Prior to Admission medications   Medication Sig Start Date End Date Taking? Authorizing Provider  albuterol (VENTOLIN HFA) 108 (90 Base) MCG/ACT inhaler Inhale 2 puffs into the lungs every 4 (four) hours as needed. 04/16/20 04/16/21 Yes [provider]  ALPRAZolam Duanne Moron) 1 MG tablet Take 1 mg by mouth daily as needed. 08/11/19  Yes [provider]  feeding supplement (BOOST / RESOURCE BREEZE) LIQD Take 1 Container by mouth 3 (three) times daily between meals. 03/19/16  Yes Epifanio Lesches, MD  mirtazapine (REMERON) 30 MG tablet Take 30 mg by mouth at bedtime.   Yes [provider]  OLANZapine (ZYPREXA) 20 MG tablet Take 20 mg by mouth at bedtime.   Yes [provider]  amoxicillin (AMOXIL) 875 MG tablet Take 1 tablet (875 mg total) by mouth 2 (two) times daily. 04/20/20   Verda Cumins, MD  benzonatate (TESSALON) 100 MG capsule Take  2 capsules (200 mg total) by mouth 3 (three) times daily as needed for cough. 04/06/20   Danton Clap, PA-C  cyclobenzaprine (FLEXERIL) 5 MG tablet Take 1-2 tablets 3 times daily as needed 05/05/18   Laban Emperor, PA-C  fluticasone [facility]) 50 MCG/ACT nasal spray Place 2 sprays into both nostrils daily. 12/28/15   Menshew, Dannielle Karvonen, PA-C  ketorolac (TORADOL) 10 MG tablet Take 1 tablet (10 mg total) by mouth every 8 (eight) hours as needed. 05/05/18   Laban Emperor, PA-C  lidocaine (LIDODERM) 5 % Place 1 patch onto the skin daily. Remove & Discard patch within 12 hours or as directed by MD 05/05/18   Laban Emperor, PA-C  lidocaine (XYLOCAINE) 2 % solution Use as  directed 15 mLs in the mouth or throat every 6 (six) hours as needed for mouth pain (hold for 2 mins to area, then spit). 08/18/19   Marylene Land, NP    Family History Family History  Problem Relation Age of Onset  . Lung cancer Mother   . Other Father        homicide    Social History Social History   Tobacco Use  . Smoking status: Current Every Day Smoker    Packs/day: 0.50    Types: Cigarettes  . Smokeless tobacco: Never Used  Vaping Use  . Vaping Use: Never used  Substance Use Topics  . Alcohol use: No  . Drug use: Yes    Frequency: 2.0 times per week    Types: Marijuana     Allergies   Bee venom, Mushroom ext cmplx(shiitake-reishi-mait), Mobic [meloxicam], and Tramadol   Review of Systems Review of Systems  Constitutional: Positive for chills. Negative for activity change, appetite change, diaphoresis, fatigue and fever.  HENT: Positive for congestion and ear pain. Negative for ear discharge, rhinorrhea, sinus pressure, sinus pain, sneezing and sore throat.   Eyes: Negative for pain.  Respiratory: Positive for cough. Negative for apnea, choking, chest tightness, shortness of breath, wheezing and stridor.   Cardiovascular: Negative for chest pain and palpitations.  Gastrointestinal: Positive for abdominal pain.  Genitourinary: Negative for dysuria.  Musculoskeletal: Positive for myalgias.  Skin: Negative for color change, pallor, rash and wound.  Neurological: Negative for dizziness, weakness, light-headedness, numbness and headaches.  All other systems reviewed and are negative.    Physical Exam Triage Vital Signs ED Triage Vitals  Enc Vitals Group     BP 04/20/20 0812 (!) 166/90     Pulse Rate 04/20/20 0812 (!) 55     Resp 04/20/20 0812 18     Temp 04/20/20 0812 98.3 F (36.8 C)     Temp Source 04/20/20 0812 Oral     SpO2 04/20/20 0812 99 %     Weight 04/20/20 0812 145 lb (65.8 kg)     Height 04/20/20 0812 5\' 2"  (1.575 m)     Head Circumference --       Peak Flow --      Pain Score 04/20/20 0811 10     Pain Loc --      Pain Edu? --      Excl. in [place]? --    No data found.  Updated Vital Signs BP (!) 166/90 (BP Location: Left Arm)   Pulse (!) 55   Temp 98.3 F (36.8 C) (Oral)   Resp 18   Ht 5\' 2"  (1.575 m)   Wt 65.8 kg   LMP 04/13/2020 (Approximate)   SpO2 99%   BMI 26.52  kg/m   Visual Acuity Right Eye Distance:   Left Eye Distance:   Bilateral Distance:    Right Eye Near:   Left Eye Near:    Bilateral Near:     Physical Exam Vitals and nursing note reviewed.  Constitutional:      General: She is not in acute distress.    Appearance: Normal appearance. She is not ill-appearing or toxic-appearing.  HENT:     Head: Normocephalic and atraumatic.     Left Ear: Tympanic membrane normal.     Ears:     Comments: Left ear: There is some mild bulging of the tympanic membrane but no erythema or active infection.  Right ear: There is bulging present.  There is some mild erythema.  No discharge.  There is the presence of fluid behind the tympanic membrane consistent with otitis media.    Nose: Congestion present. No rhinorrhea.  Eyes:     Extraocular Movements: Extraocular movements intact.     Pupils: Pupils are equal, round, and reactive to light.  Cardiovascular:     Rate and Rhythm: Normal rate and regular rhythm.     Pulses: Normal pulses.     Heart sounds: Normal heart sounds. No murmur heard. No friction rub. No gallop.   Pulmonary:     Effort: Pulmonary effort is normal. No respiratory distress.     Breath sounds: Normal breath sounds. No stridor. No wheezing, rhonchi or rales.  Musculoskeletal:     Cervical back: Normal range of motion and neck supple. Tenderness present. No rigidity.  Lymphadenopathy:     Cervical: Cervical adenopathy present.  Skin:    General: Skin is warm and dry.     Capillary Refill: Capillary refill takes less than 2 seconds.  Neurological:     General: No focal deficit present.      Mental Status: She is alert and oriented to person, place, and time.  Psychiatric:        Mood and Affect: Mood normal.        Behavior: Behavior normal.      UC Treatments / Results  Labs (all labs ordered are listed, but only abnormal results are displayed) Labs Reviewed - No data to display  EKG   Radiology No results found.  Procedures Procedures (including critical care time)  Medications Ordered in UC Medications - No data to display  Initial Impression / Assessment and Plan / UC Course  I have reviewed the triage vital signs and the nursing notes.  Pertinent labs & imaging results that were available during my care of the patient were reviewed by me and considered in my medical decision making (see chart for details).  Clinical impression Pleasant 50 year old female who recently tested positive for COVID and went through the quarantine process.  She comes in today with right ear pain congestion and cough with some body aches for 4 days.  She is not currently vaccinated.  Treatment plan: 1.  The findings and treatment plan were discussed in detail with the patient.  Patient was in agreement. 2.  No need to COVID test her as she recently was positive and went through the quarantine process. 3.  She does have a otitis media issue on examination.  We will treat accordingly. 4.  Plenty of fluids, plenty of rest, Tylenol or Motrin for fever discomfort.  She has her inhaler for any cough or wheeze.  She can use over-the-counter meds such as Mucinex or Delsym or Robitussin. 5.  Gave  her a work note keep her out of work today and tomorrow and she can return on Saturday, January 15.  Her next shift would be Monday, January 17. 6.  Follow-up here as needed.    Final Clinical Impressions(s) / UC Diagnoses   Final diagnoses:  Non-recurrent acute serous otitis media of right ear  Right ear pain  Cough     Discharge Instructions     No need to COVID test her as she  recently was positive and went through the quarantine process. She does have a otitis media issue on examination.  We will treat accordingly. Plenty of fluids, plenty of rest, Tylenol or Motrin for fever discomfort.  She has her inhaler for any cough or wheeze.  She can use over-the-counter meds such as Mucinex or Delsym or Robitussin. Gave her a work note keep her out of work today and tomorrow and she can return on Saturday, January 15.  Her next shift would be Monday, January 17. Follow-up here as needed.    ED Prescriptions    Medication Sig Dispense Auth. Provider   amoxicillin (AMOXIL) 875 MG tablet Take 1 tablet (875 mg total) by mouth 2 (two) times daily. 14 tablet Verda Cumins, MD     PDMP not reviewed this encounter.   Verda Cumins, MD 04/20/20 [phone]

## 2020-05-10 ENCOUNTER — Other Ambulatory Visit: Payer: Self-pay

## 2020-05-10 ENCOUNTER — Ambulatory Visit: Payer: Self-pay | Admitting: Pharmacist

## 2020-05-10 DIAGNOSIS — Z79899 Other long term (current) drug therapy: Secondary | ICD-10-CM

## 2020-05-10 NOTE — Progress Notes (Signed)
[facility] Visit Note  Patient: Sherry Little MRN: [id number] Date of Birth: 1970-06-28 PCP: [facility] 50 y.o. female presents for a telephone visit for medication management today. Verified patient with two identifiers.   LMP 04/13/2020 (Approximate)   Patient Information   Past Medical History:  Diagnosis Date  . Heart murmur   . Hypertension   . Migraine   . VSD (ventricular septal defect and aortic arch hypoplasia       Past Surgical History:  Procedure Laterality Date  . COLONOSCOPY WITH PROPOFOL N/A 03/19/2016   Procedure: COLONOSCOPY WITH PROPOFOL;  Surgeon: Lucilla Lame, MD;  Location: [facility];  Service: Endoscopy;  Laterality: N/A;  . ESOPHAGOGASTRODUODENOSCOPY (EGD) WITH PROPOFOL N/A 03/19/2016   Procedure: ESOPHAGOGASTRODUODENOSCOPY (EGD) WITH PROPOFOL;  Surgeon: Lucilla Lame, MD;  Location: [facility];  Service: Endoscopy;  Laterality: N/A;  . GANGLION CYST EXCISION  2008   left wrist  . HAND SURGERY     cyst romved from left thumb  . TUBAL LIGATION       Family History  Problem Relation Age of Onset  . Lung cancer Mother   . Other Father        homicide    New Diagnoses (since last visit): N/A  Family Support: Good  Lifestyle Diet: Breakfast: eggs, sausage, toast, coffee Lunch: burger Dinner: vegetables, protein Drinks: soda     Current Exercise Habits: Home exercise routine, Type of exercise: walking, Time (Minutes): 60, Frequency (Times/Week): 3, Weekly Exercise (Minutes/Week): 180       Social History   Substance and Sexual Activity  Alcohol Use No      Social History   Tobacco Use  Smoking Status Current Every Day Smoker  . Packs/day: 0.50  . Types: Cigarettes  Smokeless Tobacco Never Used      Health Maintenance  Topic Date Due  . Hepatitis C Screening  Never done  . COVID-19 Vaccine (1) Never done  . HIV Screening  Never done  . TETANUS/TDAP  Never  done  . PAP SMEAR-Modifier  Never done  . INFLUENZA VACCINE  Never done  . MAMMOGRAM  Never done  . COLONOSCOPY (Pts 45-81yrs Insurance coverage will need to be confirmed)  03/19/2026   Health Maintenance/Date Completed  Last ED visit: 04/20/20 Last Visit to PCP: 04/06/20 Next Visit to PCP: 05/15/20 Specialist Visit: N/A Dental Exam: upcoming - 05/12/20 Eye Exam: pt is in process of finding place to get a check up Pelvic/PAP Exam: upcoming - 05/22/20 Mammogram: upcoming - 05/22/20 Colonoscopy: ~2017 Flu Vaccine: never done Pneumonia Vaccine: never done COVID-19 Vaccine: never done Shingrix Vaccine: never done   Outpatient Encounter Medications as of 05/10/2020  Medication Sig  . albuterol (VENTOLIN HFA) 108 (90 Base) MCG/ACT inhaler Inhale 2 puffs into the lungs every 4 (four) hours as needed.  . clonazePAM (KLONOPIN) 1 MG tablet Take 0.5 mg by mouth daily as needed for anxiety.  . feeding supplement (BOOST / RESOURCE BREEZE) LIQD Take 1 Container by mouth 3 (three) times daily between meals.  . lidocaine (XYLOCAINE) 2 % solution Use as directed 15 mLs in the mouth or throat every 6 (six) hours as needed for mouth pain (hold for 2 mins to area, then spit).  . mirtazapine (REMERON) 30 MG tablet Take 30 mg by mouth at bedtime.  Marland Kitchen OLANZapine (ZYPREXA) 20 MG tablet Take 20 mg by mouth at bedtime.  . cyclobenzaprine (FLEXERIL) 5 MG tablet Take 1-2  tablets 3 times daily as needed (Patient not taking: Reported on 05/10/2020)  . fluticasone (FLONASE) 50 MCG/ACT nasal spray Place 2 sprays into both nostrils daily. (Patient not taking: Reported on 05/10/2020)  . [DISCONTINUED] ALPRAZolam (XANAX) 1 MG tablet Take 1 mg by mouth daily as needed. (Patient not taking: Reported on 05/10/2020)  . [DISCONTINUED] amoxicillin (AMOXIL) 875 MG tablet Take 1 tablet (875 mg total) by mouth 2 (two) times daily.  . [DISCONTINUED] benzonatate (TESSALON) 100 MG capsule Take 2 capsules (200 mg total) by mouth 3 (three) times  daily as needed for cough.  . [DISCONTINUED] ketorolac (TORADOL) 10 MG tablet Take 1 tablet (10 mg total) by mouth every 8 (eight) hours as needed.  . [DISCONTINUED] lidocaine (LIDODERM) 5 % Place 1 patch onto the skin daily. Remove & Discard patch within 12 hours or as directed by MD   No facility-administered encounter medications on file as of 05/10/2020.     Assessment and Plan: Recent ear infection 04/20/20 Pt had a recent ear infection which she states has resolved and her last dose of antibiotics (amoxicillin) is today (05/10/20). If symptoms worsen or persist, follow up with PCP.   HTN Pt was taken off of her BP medications as she was previously controlled. However, pt states she checks her BP everyday and it has been in the 170/90s. She states she has an appt on Monday (05/15/20) and will let her provider know. Advised pt to either bring the monitor or write down several of the BP values to bring with her to the appt. Encouraged pt to limit dietary salt intake to help with blood pressure and to continue her current exercise (3-4 times per week for 30-60 min).   Depression/Anxiety Pt takes mirtazapine, olanzapine, and clonazepam and states these medications are working well for her. She takes the clonazepam PRN and reports only having to take it every other day.   Tobacco use Pt currently smokes 0.5ppd and is working on quitting. She is using the patches and states they have been helping. Informed pt that smoking cessation would also help with her BP.    SOB Pt uses albuterol PRN for SOB. She states she needed to use it last night and it resolved her sx. Pt states she uses it maybe 3x/wk. If frequency increases or symptoms do not resolve after use of inhaler, inform PCP for step up in management of anxiety meds as the SOB could be induced by anxiety and there is no documentation of asthma.   Access/Adherence Pt endorses adherence as it is apart of her normal daily routine to take her  medications. She requested refills for mirtazapine and olanzapine - orders placed, however pt doses reported are not the same as the prescriptions on file. Will need to clarify with pt at pick up.     Sherilyn Banker, PharmD Pharmacy Resident  05/10/2020 10:00 AM

## 2020-06-26 ENCOUNTER — Telehealth: Payer: Self-pay | Admitting: Pharmacy Technician

## 2020-06-26 NOTE — Telephone Encounter (Signed)
Patient failed to provide 2022 proof of income.  No additional medication assistance will be provided by [facility] without the required proof of income documentation.  Patient notified by letter.  [place], [place]  [id number]    June 26, 2020    [facility] [address] [place], [place]  [phone]  Dear Sherry Little:  This is to inform you that you are no longer eligible to receive medication assistance at [facility].  The reason(s) are:    _____Your total gross monthly household income exceeds 250% of the Federal Poverty Level.   _____Tangible assets (savings, checking, stocks/bonds, pension, retirement, etc.) exceeds our limit  _____You are eligible to receive benefits from [facility], [facility] or HIV Medication              Assistance Program _____You are eligible to receive benefits from a Medicare Part "D" plan _____You have prescription insurance  _____You are not an [facility] resident __X__Failure to provide all requested proof of income information for 2022.    Medication assistance will resume once all requested financial information has been returned to our clinic.  If you have questions, please contact our clinic at [phone].    Thank you,  [facility]

## 2020-07-19 ENCOUNTER — Other Ambulatory Visit: Payer: Self-pay

## 2020-07-20 ENCOUNTER — Ambulatory Visit (INDEPENDENT_AMBULATORY_CARE_PROVIDER_SITE_OTHER): Payer: Self-pay

## 2020-07-20 ENCOUNTER — Other Ambulatory Visit: Payer: Self-pay

## 2020-07-20 ENCOUNTER — Ambulatory Visit
Admission: EM | Admit: 2020-07-20 | Discharge: 2020-07-20 | Disposition: A | Discharge status: Home / Self Care | Payer: Self-pay | Attending: Family Medicine | Admitting: Family Medicine

## 2020-07-20 DIAGNOSIS — M79671 Pain in right foot: Secondary | ICD-10-CM

## 2020-07-20 HISTORY — DX: Depression, unspecified: F32.A

## 2020-07-20 HISTORY — DX: Anxiety disorder, unspecified: F41.9

## 2020-07-20 MED ORDER — MIRTAZAPINE 15 MG PO TABS
ORAL_TABLET | ORAL | 2 refills | Status: DC
  Filled 2020-07-20: qty 15, 30d supply, fill #0

## 2020-07-20 MED ORDER — KETOROLAC TROMETHAMINE 10 MG PO TABS: 10.0000 mg | ORAL_TABLET | Freq: Four times a day (QID) | ORAL | 0 refills | Status: DC | PRN

## 2020-07-20 NOTE — ED Triage Notes (Signed)
Pt c/o a knot on the outside of her right foot. Pt reports pain for the past 3 days. She denies any known injury. She is not sure how long the knot has been there overall. Pt denies any swelling or bruising.

## 2020-07-20 NOTE — ED Provider Notes (Signed)
[facility]    CSN: [id number] Arrival date & time: 07/20/20  0801      History   Chief Complaint Chief Complaint  Patient presents with  . Foot Pain    right   HPI  50 year old female presents with right foot pain.  Patient reports that she has had ongoing pain on the lateral aspect of her right foot for the past 2 weeks.  She states that her pain has been intensifying over the past 3 days.  Pain is currently 9/10 in severity.  She localizes the pain predominantly to the base of the fifth metatarsal.  Denies fall, trauma, injury.  No bruising.  No relieving factors.  No other reported symptoms.  No other complaints.  Past Medical History:  Diagnosis Date  . Anxiety   . Depression   . Heart murmur   . Hypertension   . Migraine   . VSD (ventricular septal defect and aortic arch hypoplasia     Patient Active Problem List   Diagnosis Date Noted  . Portal hypertension ([place])   . Other diseases of stomach and duodenum   . Secondary esophageal varices without bleeding ([place])   . Loss of weight   . Iron deficiency anemia due to chronic blood loss   . Gastritis without bleeding   . Polyp of sigmoid colon   . Abdominal pain, epigastric 03/17/2016  . Symptomatic anemia 03/17/2016  . Valvular heart disease 03/17/2016  . Bradycardia 03/17/2016  . Abdominal pain 03/17/2016    Past Surgical History:  Procedure Laterality Date  . COLONOSCOPY WITH PROPOFOL N/A 03/19/2016   Procedure: COLONOSCOPY WITH PROPOFOL;  Surgeon: Lucilla Lame, MD;  Location: [facility];  Service: Endoscopy;  Laterality: N/A;  . ESOPHAGOGASTRODUODENOSCOPY (EGD) WITH PROPOFOL N/A 03/19/2016   Procedure: ESOPHAGOGASTRODUODENOSCOPY (EGD) WITH PROPOFOL;  Surgeon: Lucilla Lame, MD;  Location: [facility];  Service: Endoscopy;  Laterality: N/A;  . GANGLION CYST EXCISION  2008   left wrist  . HAND SURGERY     cyst romved from left thumb  . TUBAL LIGATION      OB History   No obstetric  history on file.      Home Medications    Prior to Admission medications   Medication Sig Start Date End Date Taking? Authorizing Provider  albuterol (VENTOLIN HFA) 108 (90 Base) MCG/ACT inhaler Inhale 2 puffs into the lungs every 4 (four) hours as needed. 04/16/20 04/16/21 Yes [provider]  citalopram (CELEXA) 10 MG tablet TAKE ONE TABLET BY MOUTH AT BEDTIME 12/08/19 12/07/20 Yes Odem, Coolidge Breeze, FNP  clonazePAM (KLONOPIN) 1 MG tablet Take 0.5 mg by mouth daily as needed for anxiety.   Yes [provider]  feeding supplement (BOOST / RESOURCE BREEZE) LIQD Take 1 Container by mouth 3 (three) times daily between meals. 03/19/16  Yes Epifanio Lesches, MD  hydrochlorothiazide (HYDRODIURIL) 25 MG tablet Take 1 tablet by mouth daily. 06/14/20  Yes [provider]  ketorolac (TORADOL) 10 MG tablet Take 1 tablet (10 mg total) by mouth every 6 (six) hours as needed for moderate pain or severe pain. 07/20/20  Yes Ilda Laskin G, DO  mirtazapine (REMERON) 15 MG tablet TAKE HALF A TABLET(7.5MG ) BY MOUTH AT BEDTIME 03/22/20 01/05/21 Yes Ahluwalia, Shamsher S, MD  OLANZapine (ZYPREXA) 15 MG tablet TAKE ONE TABLET BY MOUTH AT BEDTIME 03/22/20 01/05/21 Yes Ahluwalia, Shamsher S, MD  OLANZapine (ZYPREXA) 20 MG tablet Take 20 mg by mouth at bedtime.   Yes [provider]  fluticasone (FLONASE) 50 MCG/ACT nasal spray Place 2 sprays into both nostrils daily. Patient not taking: No sig reported 12/28/15 07/20/20  Menshew, Dannielle Karvonen, PA-C    Family History Family History  Problem Relation Age of Onset  . Lung cancer Mother   . Other Father        homicide    Social History Social History   Tobacco Use  . Smoking status: Current Every Day Smoker    Packs/day: 0.50    Types: Cigarettes  . Smokeless tobacco: Never Used  Vaping Use  . Vaping Use: Never used  Substance Use Topics  . Alcohol use: No  . Drug use: Not Currently    Frequency: 2.0 times per week    Types:  Marijuana     Allergies   Bee venom, Mushroom ext cmplx(shiitake-reishi-mait), Mobic [meloxicam], and Tramadol   Review of Systems Review of Systems  Constitutional: Negative.   Musculoskeletal:       Right foot pain.   Physical Exam Triage Vital Signs ED Triage Vitals  Enc Vitals Group     BP 07/20/20 0821 (!) 148/86     Pulse Rate 07/20/20 0821 (!) 52     Resp 07/20/20 0821 18     Temp 07/20/20 0821 98.2 F (36.8 C)     Temp Source 07/20/20 0821 Oral     SpO2 07/20/20 0821 100 %     Weight 07/20/20 0817 150 lb (68 kg)     Height 07/20/20 0817 5\' 2"  (1.575 m)     Head Circumference --      Peak Flow --      Pain Score 07/20/20 0817 9     Pain Loc --      Pain Edu? --      Excl. in [place]? --    Updated Vital Signs BP (!) 148/86 (BP Location: Left Arm)   Pulse (!) 52   Temp 98.2 F (36.8 C) (Oral)   Resp 18   Ht 5\' 2"  (1.575 m)   Wt 68 kg   LMP 06/19/2020 (Approximate) Comment: pt guessed date  SpO2 100%   BMI 27.44 kg/m   Visual Acuity Right Eye Distance:   Left Eye Distance:   Bilateral Distance:    Right Eye Near:   Left Eye Near:    Bilateral Near:     Physical Exam Vitals and nursing note reviewed.  Constitutional:      General: She is not in acute distress.    Appearance: Normal appearance. She is not ill-appearing.  HENT:     Head: Normocephalic and atraumatic.  Eyes:     General:        Right eye: No discharge.        Left eye: No discharge.     Conjunctiva/sclera: Conjunctivae normal.  Pulmonary:     Effort: Pulmonary effort is normal. No respiratory distress.  Musculoskeletal:     Comments: Right foot -no appreciable bruising.  Tenderness at the base of his metatarsal.  Neurological:     Mental Status: She is alert.  Psychiatric:        Mood and Affect: Mood normal.        Behavior: Behavior normal.    UC Treatments / Results  Labs (all labs ordered are listed, but only abnormal results are displayed) Labs Reviewed - No data to  display  EKG   Radiology DG Foot Complete Right  Result Date: 07/20/2020 CLINICAL DATA:  Right foot  pain EXAM: RIGHT FOOT COMPLETE - 3+ VIEW COMPARISON:  None. FINDINGS: There is no evidence of fracture or dislocation. There is no evidence of arthropathy or other focal bone abnormality. Soft tissues are unremarkable. IMPRESSION: Negative. Electronically Signed   By: Jerilynn Mages.  Shick M.D.   On: 07/20/2020 08:52    Procedures Procedures (including critical care time)  Medications Ordered in UC Medications - No data to display  Initial Impression / Assessment and Plan / UC Course  I have reviewed the triage vital signs and the nursing notes.  Pertinent labs & imaging results that were available during my care of the patient were reviewed by me and considered in my medical decision making (see chart for details).    50 year old female presents with right foot pain.  X-ray was obtained for further evaluation.  X-ray independently reviewed by me.  Interpretation: Normal x-ray.  No evidence of fracture.  No significant arthritic change.  Advised rest, ice.  Toradol as prescribed.  Work note given.  Final Clinical Impressions(s) / UC Diagnoses   Final diagnoses:  Foot pain, right     Discharge Instructions     Rest. Ice the area.  Medication as directed.  See Podiatry if this persists.   Take care  Dr. Lacinda Axon     ED Prescriptions    Medication Sig Dispense Auth. Provider   ketorolac (TORADOL) 10 MG tablet Take 1 tablet (10 mg total) by mouth every 6 (six) hours as needed for moderate pain or severe pain. 20 tablet Coral Spikes, DO     PDMP not reviewed this encounter.   Thersa Salt [place], [place] 07/20/20 [phone]

## 2020-07-20 NOTE — Discharge Instructions (Signed)
Rest. Ice the area.  Medication as directed.  See Podiatry if this persists.   Take care  Dr. Lacinda Axon

## 2020-07-21 ENCOUNTER — Other Ambulatory Visit: Payer: Self-pay

## 2020-07-28 ENCOUNTER — Telehealth: Payer: Self-pay | Admitting: Pharmacist

## 2020-07-28 NOTE — Telephone Encounter (Signed)
Patient [facility] approved till 06/06/2021.aj

## 2020-08-04 ENCOUNTER — Other Ambulatory Visit: Payer: Self-pay

## 2020-08-04 MED ORDER — OLANZAPINE 15 MG PO TABS
ORAL_TABLET | Freq: Every day | ORAL | 2 refills | Status: DC
  Filled 2020-08-04: qty 30, 30d supply, fill #0

## 2020-08-04 MED FILL — Citalopram Hydrobromide Tab 10 MG (Base Equiv): ORAL | 30 days supply | Qty: 30 | Fill #0 | Status: AC

## 2020-08-07 ENCOUNTER — Other Ambulatory Visit: Payer: Self-pay

## 2020-08-23 ENCOUNTER — Other Ambulatory Visit: Payer: Self-pay

## 2020-08-23 MED ORDER — MIRTAZAPINE 45 MG PO TABS
ORAL_TABLET | Freq: Every evening | ORAL | 5 refills | Status: DC
  Filled 2020-08-23: qty 30, 30d supply, fill #0
  Filled 2020-09-22: qty 30, 30d supply, fill #1
  Filled 2020-10-24: qty 30, 30d supply, fill #2
  Filled 2020-11-25: qty 30, 30d supply, fill #3
  Filled 2020-12-25: qty 30, 30d supply, fill #4
  Filled 2021-01-28: qty 30, 30d supply, fill #5

## 2020-08-23 MED ORDER — OLANZAPINE 2.5 MG PO TABS
ORAL_TABLET | ORAL | 2 refills | Status: DC
  Filled 2020-08-23: qty 30, 30d supply, fill #0
  Filled 2020-09-22: qty 30, 30d supply, fill #1
  Filled 2020-10-24: qty 30, 30d supply, fill #2

## 2020-08-25 ENCOUNTER — Other Ambulatory Visit: Payer: Self-pay

## 2020-08-30 ENCOUNTER — Other Ambulatory Visit: Payer: Self-pay

## 2020-08-30 ENCOUNTER — Encounter: Payer: Self-pay | Admitting: Emergency Medicine

## 2020-08-30 ENCOUNTER — Ambulatory Visit (INDEPENDENT_AMBULATORY_CARE_PROVIDER_SITE_OTHER): Payer: Commercial Managed Care - PPO

## 2020-08-30 ENCOUNTER — Ambulatory Visit
Admission: EM | Admit: 2020-08-30 | Discharge: 2020-08-30 | Disposition: A | Discharge status: Home / Self Care | Payer: Commercial Managed Care - PPO | Attending: Sports Medicine | Admitting: Sports Medicine

## 2020-08-30 DIAGNOSIS — S60222A Contusion of left hand, initial encounter: Secondary | ICD-10-CM

## 2020-08-30 DIAGNOSIS — M79642 Pain in left hand: Secondary | ICD-10-CM

## 2020-08-30 DIAGNOSIS — S6722XA Crushing injury of left hand, initial encounter: Secondary | ICD-10-CM

## 2020-08-30 DIAGNOSIS — M25642 Stiffness of left hand, not elsewhere classified: Secondary | ICD-10-CM

## 2020-08-30 NOTE — ED Provider Notes (Signed)
[facility]    CSN: [id number] Arrival date & time: 08/30/20  1620      History   Chief Complaint Chief Complaint  Patient presents with  . Hand Pain    left    HPI Sherry Little is a 50 y.o. female.   Patient is a 50 year old right-hand-dominant female who presents for evaluation of an injury to her left hand.  Normally sees [facility] for ongoing medical care but could not be seen today.  She works over at [employer].  She reports she injured her hand at work last night.  She works third shift so was actually earlier today.  She is not climbing on [employer] because she thought it would just get better on its own.  She woke up today and it was painful and swollen so she comes into urgent care for evaluation.  She said that she was trying to clear a jam in a machine and a piece of metal came down and hit her in the hand.  Most of her pain is over the third and fourth fingers at the PIP joints.  She is noted a little bit of swelling and decreased range of motion.  Just been using over-the-counter meds.  Has not really been icing it.  She denies any chronic problems to this hand.  No numbness or tingling.  No red flag signs or symptoms elicited on history.     Past Medical History:  Diagnosis Date  . Anxiety   . Depression   . Heart murmur   . Hypertension   . Migraine   . VSD (ventricular septal defect and aortic arch hypoplasia     Patient Active Problem List   Diagnosis Date Noted  . Portal hypertension ([place])   . Other diseases of stomach and duodenum   . Secondary esophageal varices without bleeding ([place])   . Loss of weight   . Iron deficiency anemia due to chronic blood loss   . Gastritis without bleeding   . Polyp of sigmoid colon   . Abdominal pain, epigastric 03/17/2016  . Symptomatic anemia 03/17/2016  . Valvular heart disease 03/17/2016  . Bradycardia 03/17/2016  . Abdominal pain 03/17/2016    Past Surgical  History:  Procedure Laterality Date  . COLONOSCOPY WITH PROPOFOL N/A 03/19/2016   Procedure: COLONOSCOPY WITH PROPOFOL;  Surgeon: Lucilla Lame, MD;  Location: [facility];  Service: Endoscopy;  Laterality: N/A;  . ESOPHAGOGASTRODUODENOSCOPY (EGD) WITH PROPOFOL N/A 03/19/2016   Procedure: ESOPHAGOGASTRODUODENOSCOPY (EGD) WITH PROPOFOL;  Surgeon: Lucilla Lame, MD;  Location: [facility];  Service: Endoscopy;  Laterality: N/A;  . GANGLION CYST EXCISION  2008   left wrist  . HAND SURGERY     cyst romved from left thumb  . TUBAL LIGATION      OB History   No obstetric history on file.      Home Medications    Prior to Admission medications   Medication Sig Start Date End Date Taking? Authorizing Provider  albuterol (VENTOLIN HFA) 108 (90 Base) MCG/ACT inhaler Inhale 2 puffs into the lungs every 4 (four) hours as needed. 04/16/20 04/16/21 Yes [provider]  clonazePAM (KLONOPIN) 1 MG tablet Take 0.5 mg by mouth daily as needed for anxiety.   Yes [provider]  hydrochlorothiazide (HYDRODIURIL) 25 MG tablet Take 1 tablet by mouth daily. 06/14/20  Yes [provider]  mirtazapine (REMERON) 45 MG tablet TAKE ONE TABLET (45MG  TOTAL) BY MOUTH ONCE DAILY  AT BEDTIME FOR MOOD AND SLEEP. 08/23/20  Yes   OLANZapine (ZYPREXA) 2.5 MG tablet TAKE ONE TABLET (2.5MG  TOTAL) BY MOUTH ONCE DAILY AT BEDTIME. 08/23/20  Yes   citalopram (CELEXA) 10 MG tablet TAKE ONE TABLET BY MOUTH AT BEDTIME 12/08/19 12/07/20  Boyce Medici, FNP  feeding supplement (BOOST / RESOURCE BREEZE) LIQD Take 1 Container by mouth 3 (three) times daily between meals. 03/19/16   Epifanio Lesches, MD  ketorolac (TORADOL) 10 MG tablet Take 1 tablet (10 mg total) by mouth every 6 (six) hours as needed for moderate pain or severe pain. 07/20/20   Coral Spikes, DO  mirtazapine (REMERON) 15 MG tablet TAKE HALF A TABLET (7.5MG ) BY MOUTH AT BEDTIME 07/20/20 05/05/21  Ahluwalia, Shamsher S, MD  OLANZapine (ZYPREXA) 15 MG  tablet TAKE ONE TABLET BY MOUTH AT BEDTIME 08/04/20 05/20/21  Ahluwalia, Shamsher S, MD  OLANZapine (ZYPREXA) 20 MG tablet Take 20 mg by mouth at bedtime.    [provider]  fluticasone (FLONASE) 50 MCG/ACT nasal spray Place 2 sprays into both nostrils daily. Patient not taking: No sig reported 12/28/15 07/20/20  Menshew, Dannielle Karvonen, PA-C    Family History Family History  Problem Relation Age of Onset  . Lung cancer Mother   . Other Father        homicide    Social History Social History   Tobacco Use  . Smoking status: Current Every Day Smoker    Packs/day: 0.50    Types: Cigarettes  . Smokeless tobacco: Never Used  Vaping Use  . Vaping Use: Never used  Substance Use Topics  . Alcohol use: No  . Drug use: Not Currently    Frequency: 2.0 times per week    Types: Marijuana     Allergies   Bee venom, Mushroom ext cmplx(shiitake-reishi-mait), Mobic [meloxicam], and Tramadol   Review of Systems Review of Systems  Constitutional: Positive for activity change. Negative for appetite change, chills, diaphoresis, fatigue and fever.  HENT: Negative for congestion, ear pain, postnasal drip, rhinorrhea, sinus pressure, sinus pain, sneezing and sore throat.   Eyes: Negative for pain.  Respiratory: Negative for cough, chest tightness and shortness of breath.   Cardiovascular: Negative for chest pain and palpitations.  Gastrointestinal: Negative for abdominal pain, diarrhea, nausea and vomiting.  Genitourinary: Negative for dysuria.  Musculoskeletal: Positive for arthralgias and joint swelling. Negative for back pain, myalgias, neck pain and neck stiffness.  Skin: Negative for color change, pallor, rash and wound.  Neurological: Negative for dizziness, light-headedness and headaches.  All other systems reviewed and are negative.    Physical Exam Triage Vital Signs ED Triage Vitals  Enc Vitals Group     BP 08/30/20 1645 (!) 154/96     Pulse Rate 08/30/20 1645 60      Resp 08/30/20 1645 18     Temp 08/30/20 1645 98.3 F (36.8 C)     Temp Source 08/30/20 1645 Oral     SpO2 08/30/20 1645 100 %     Weight 08/30/20 1642 149 lb 14.6 oz (68 kg)     Height 08/30/20 1642 5\' 2"  (1.575 m)     Head Circumference --      Peak Flow --      Pain Score 08/30/20 1642 10     Pain Loc --      Pain Edu? --      Excl. in [place]? --    No data found.  Updated Vital Signs BP Marland Kitchen)  154/96 (BP Location: Left Arm)   Pulse 60   Temp 98.3 F (36.8 C) (Oral)   Resp 18   Ht 5\' 2"  (1.575 m)   Wt 68 kg   LMP 06/30/2020 (Approximate)   SpO2 100%   BMI 27.42 kg/m   Visual Acuity Right Eye Distance:   Left Eye Distance:   Bilateral Distance:    Right Eye Near:   Left Eye Near:    Bilateral Near:     Physical Exam   UC Treatments / Results  Labs (all labs ordered are listed, but only abnormal results are displayed) Labs Reviewed - No data to display  EKG   Radiology   Procedures Procedures (including critical care time)  Medications Ordered in UC Medications - No data to display  Initial Impression / Assessment and Plan / UC Course  I have reviewed the triage vital signs and the nursing notes.  Pertinent labs & imaging results that were available during my care of the patient were reviewed by me and considered in my medical decision making (see chart for details).   Clinical impression: Left hand injury status post direct trauma.  Concern for possible fracture versus contusion.  Initially she was fine but as she woke up today from sleeping she was stiff and sore with decreased range of motion.  Treatment plan: 1.  The findings and treatment plan were discussed in detail with the patient.  Patient was in agreement. 2.  We will get an x-ray.  It was ordered and interpreted by myself here in the office today.  My independent review does not reveal any fractures.  No soft tissue involvement.  No acute osseous findings.  Once I hear back from radiology if  it differs from my interpretation I will be in touch with the patient. 3. provided educational handouts. 4.  Gave her a work note. 5.  Over-the-counter meds as needed. 6.  Lots of icing. 7.  If symptoms persist she should see her PCP. 8.  She was discharged in stable condition and she will follow-up here as needed.    Final Clinical Impressions(s) / UC Diagnoses   Final diagnoses:  Left hand pain  Contusion of left hand, initial encounter  Finger stiffness, left     Discharge Instructions     As we discussed, I did not see anything on your x-ray that indicates a fracture.  Once I hear from radiology of they differ from my interpretation I will let you know. For analysis please see educational handouts. Over-the-counter meds as needed and lots of icing. 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 If symptoms persist please contact your primary care office.    ED Prescriptions    None     PDMP not reviewed this encounter.   Verda Cumins, MD 08/30/20 1750

## 2020-08-30 NOTE — Discharge Instructions (Addendum)
As we discussed, I did not see anything on your x-ray that indicates a fracture.  Once I hear from radiology of they differ from my interpretation I will let you know. For analysis please see educational handouts. Over-the-counter meds as needed and lots of icing. 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 If symptoms persist please contact your primary care office.

## 2020-08-30 NOTE — ED Triage Notes (Signed)
Pt c/o left hand pain. She states she "jammed" her hand this morning at work. (pt states this will not be workers comp)

## 2020-09-14 ENCOUNTER — Other Ambulatory Visit: Payer: Self-pay

## 2020-09-22 ENCOUNTER — Other Ambulatory Visit: Payer: Self-pay

## 2020-09-22 ENCOUNTER — Other Ambulatory Visit: Payer: Self-pay | Admitting: Nurse Practitioner

## 2020-09-25 ENCOUNTER — Other Ambulatory Visit: Payer: Self-pay

## 2020-09-27 ENCOUNTER — Other Ambulatory Visit: Payer: Self-pay

## 2020-09-29 ENCOUNTER — Other Ambulatory Visit: Payer: Self-pay

## 2020-10-05 ENCOUNTER — Other Ambulatory Visit: Payer: Self-pay

## 2020-10-10 ENCOUNTER — Other Ambulatory Visit: Payer: Self-pay

## 2020-10-11 ENCOUNTER — Other Ambulatory Visit: Payer: Self-pay

## 2020-10-16 ENCOUNTER — Other Ambulatory Visit: Payer: Self-pay

## 2020-10-17 ENCOUNTER — Other Ambulatory Visit: Payer: Self-pay

## 2020-10-17 ENCOUNTER — Ambulatory Visit
Admission: EM | Admit: 2020-10-17 | Discharge: 2020-10-17 | Disposition: A | Discharge status: Home / Self Care | Payer: Commercial Managed Care - PPO | Attending: Emergency Medicine | Admitting: Emergency Medicine

## 2020-10-17 DIAGNOSIS — F17210 Nicotine dependence, cigarettes, uncomplicated: Secondary | ICD-10-CM | POA: Insufficient documentation

## 2020-10-17 DIAGNOSIS — Z20822 Contact with and (suspected) exposure to covid-19: Secondary | ICD-10-CM | POA: Insufficient documentation

## 2020-10-17 DIAGNOSIS — B349 Viral infection, unspecified: Secondary | ICD-10-CM | POA: Diagnosis not present

## 2020-10-17 DIAGNOSIS — R197 Diarrhea, unspecified: Secondary | ICD-10-CM | POA: Diagnosis not present

## 2020-10-17 DIAGNOSIS — Z79899 Other long term (current) drug therapy: Secondary | ICD-10-CM | POA: Insufficient documentation

## 2020-10-17 LAB — RESP PANEL BY RT-PCR (FLU A&B, COVID) ARPGX2
Influenza A by PCR: NEGATIVE
Influenza B by PCR: NEGATIVE
SARS Coronavirus 2 by RT PCR: NEGATIVE

## 2020-10-17 MED ORDER — IPRATROPIUM BROMIDE 0.06 % NA SOLN: 2.0000 | Freq: Four times a day (QID) | NASAL | 12 refills | Status: DC

## 2020-10-17 MED ORDER — ONDANSETRON 8 MG PO TBDP: 8.0000 mg | ORAL_TABLET | Freq: Three times a day (TID) | ORAL | 0 refills | Status: DC | PRN

## 2020-10-17 NOTE — Discharge Instructions (Addendum)
Use the Atrovent nasal spray, 2 squirts in each nostril every 6 hours, as needed for runny nose and postnasal drip.  Use the Zofran every 8 hours as needed for nausea.  Take over the counter Tylenol or ibuprofen according to the package instructions needed for fever and body aches.  Follow the food resource guide in your discharge instructions for food choices to help you alleviate your diarrhea.  You can also use over-the-counter Imodium as needed.  Return for reevaluation or see your primary care provider for any new or worsening symptoms.

## 2020-10-17 NOTE — ED Triage Notes (Signed)
Pt c/o chills, body aches, sore throat and diarrhea for about a week. Pt denies f/n/v, congestion or cough. Pt states her partner was sick with same symptoms the week prior.

## 2020-10-17 NOTE — ED Provider Notes (Signed)
[facility]    CSN: [id number] Arrival date & time: 10/17/20  1608      History   Chief Complaint Chief Complaint  Patient presents with   Generalized Body Aches   Sore Throat    HPI Sherry Little is a 50 y.o. female.   HPI  50 year old female here for evaluation of COVID-like symptoms.  Patient reports that she has been experiencing chills, runny nose, right ear pain, sore throat, body aches, nausea, and diarrhea for the last 4 days.  She denies fever, nasal congestion, cough, or vomiting.  She reports that her partner had similar symptoms the week prior but was never evaluated.  Patient is sitting in a warm room with her arms inside of her shirt because of feeling chilled.  Past Medical History:  Diagnosis Date   Anxiety    Depression    Heart murmur    Hypertension    Migraine    VSD (ventricular septal defect and aortic arch hypoplasia     Patient Active Problem List   Diagnosis Date Noted   Portal hypertension ([place])    Other diseases of stomach and duodenum    Secondary esophageal varices without bleeding (HCC)    Loss of weight    Iron deficiency anemia due to chronic blood loss    Gastritis without bleeding    Polyp of sigmoid colon    Abdominal pain, epigastric 03/17/2016   Symptomatic anemia 03/17/2016   Valvular heart disease 03/17/2016   Bradycardia 03/17/2016   Abdominal pain 03/17/2016    Past Surgical History:  Procedure Laterality Date   COLONOSCOPY WITH PROPOFOL N/A 03/19/2016   Procedure: COLONOSCOPY WITH PROPOFOL;  Surgeon: Lucilla Lame, MD;  Location: [facility];  Service: Endoscopy;  Laterality: N/A;   ESOPHAGOGASTRODUODENOSCOPY (EGD) WITH PROPOFOL N/A 03/19/2016   Procedure: ESOPHAGOGASTRODUODENOSCOPY (EGD) WITH PROPOFOL;  Surgeon: Lucilla Lame, MD;  Location: [facility];  Service: Endoscopy;  Laterality: N/A;   GANGLION CYST EXCISION  2008   left wrist   HAND SURGERY     cyst romved from left thumb   TUBAL LIGATION       OB History   No obstetric history on file.      Home Medications    Prior to Admission medications   Medication Sig Start Date End Date Taking? Authorizing Provider  albuterol (VENTOLIN HFA) 108 (90 Base) MCG/ACT inhaler Inhale 2 puffs into the lungs every 4 (four) hours as needed. 04/16/20 04/16/21 Yes [provider]  busPIRone (BUSPAR) 5 MG tablet Take 5 mg by mouth 2 (two) times daily. 08/28/20  Yes [provider]  citalopram (CELEXA) 10 MG tablet TAKE ONE TABLET BY MOUTH AT BEDTIME 12/08/19 12/07/20 Yes Odem, Coolidge Breeze, FNP  clonazePAM (KLONOPIN) 0.5 MG tablet Take 0.5 mg by mouth at bedtime as needed. 06/14/20  Yes [provider]  hydrochlorothiazide (HYDRODIURIL) 25 MG tablet Take 1 tablet by mouth daily. 06/14/20  Yes [provider]  ipratropium (ATROVENT) 0.06 % nasal spray Place 2 sprays into both nostrils 4 (four) times daily. 10/17/20  Yes Margarette Canada, NP  mirtazapine (REMERON) 45 MG tablet TAKE ONE TABLET (45MG  TOTAL) BY MOUTH ONCE DAILY AT BEDTIME FOR MOOD AND SLEEP. 08/23/20  Yes   OLANZapine (ZYPREXA) 2.5 MG tablet TAKE ONE TABLET (2.5MG  TOTAL) BY MOUTH ONCE DAILY AT BEDTIME. 08/23/20  Yes   ondansetron (ZOFRAN ODT) 8 MG disintegrating tablet Take 1 tablet (8 mg total) by mouth every 8 (eight) hours as needed for  nausea or vomiting. 10/17/20  Yes Margarette Canada, NP  feeding supplement (BOOST / RESOURCE BREEZE) LIQD Take 1 Container by mouth 3 (three) times daily between meals. 03/19/16   Epifanio Lesches, MD  fluticasone (FLONASE) 50 MCG/ACT nasal spray Place 2 sprays into both nostrils daily. Patient not taking: No sig reported 12/28/15 07/20/20  Menshew, Dannielle Karvonen, PA-C    Family History Family History  Problem Relation Age of Onset   Lung cancer Mother    Other Father        homicide    Social History Social History   Tobacco Use   Smoking status: Every Day    Packs/day: 0.50    Pack years: 0.00    Types: Cigarettes    Smokeless tobacco: Never  Vaping Use   Vaping Use: Never used  Substance Use Topics   Alcohol use: No   Drug use: Not Currently    Frequency: 2.0 times per week    Types: Marijuana     Allergies   Bee venom, Mushroom ext cmplx(shiitake-reishi-mait), Mobic [meloxicam], and Tramadol   Review of Systems Review of Systems  Constitutional:  Positive for chills. Negative for fever.  HENT:  Positive for ear pain, rhinorrhea and sore throat. Negative for congestion.   Respiratory:  Negative for cough.   Gastrointestinal:  Positive for diarrhea and nausea. Negative for vomiting.  Musculoskeletal:  Positive for arthralgias and myalgias.  Skin:  Negative for rash.  Hematological: Negative.   Psychiatric/Behavioral: Negative.      Physical Exam Triage Vital Signs ED Triage Vitals  Enc Vitals Group     BP 10/17/20 1633 (!) 164/87     Pulse Rate 10/17/20 1633 64     Resp 10/17/20 1633 18     Temp 10/17/20 1633 98.4 F (36.9 C)     Temp Source 10/17/20 1633 Oral     SpO2 10/17/20 1633 100 %     Weight 10/17/20 1628 145 lb (65.8 kg)     Height 10/17/20 1628 5\' 2"  (1.575 m)     Head Circumference --      Peak Flow --      Pain Score 10/17/20 1628 9     Pain Loc --      Pain Edu? --      Excl. in [place]? --    No data found.  Updated Vital Signs BP (!) 164/87 (BP Location: Left Arm)   Pulse 64   Temp 98.4 F (36.9 C) (Oral)   Resp 18   Ht 5\' 2"  (1.575 m)   Wt 145 lb (65.8 kg)   SpO2 100%   BMI 26.52 kg/m   Visual Acuity Right Eye Distance:   Left Eye Distance:   Bilateral Distance:    Right Eye Near:   Left Eye Near:    Bilateral Near:     Physical Exam Vitals and nursing note reviewed.  Constitutional:      General: She is not in acute distress.    Appearance: Normal appearance. She is normal weight. She is ill-appearing.  HENT:     Head: Normocephalic and atraumatic.     Right Ear: Tympanic membrane, ear canal and external ear normal. There is no impacted  cerumen.     Left Ear: Tympanic membrane, ear canal and external ear normal. There is no impacted cerumen.     Nose: Congestion and rhinorrhea present.     Mouth/Throat:     Mouth: Mucous membranes are moist.  Pharynx: Oropharynx is clear. Posterior oropharyngeal erythema present.  Cardiovascular:     Rate and Rhythm: Normal rate and regular rhythm.     Pulses: Normal pulses.     Heart sounds: Normal heart sounds. No murmur heard.   No gallop.  Pulmonary:     Effort: Pulmonary effort is normal.     Breath sounds: Normal breath sounds. No wheezing, rhonchi or rales.  Musculoskeletal:     Cervical back: Normal range of motion and neck supple.  Lymphadenopathy:     Cervical: Cervical adenopathy present.  Skin:    General: Skin is warm and dry.     Capillary Refill: Capillary refill takes less than 2 seconds.     Findings: No erythema.  Neurological:     General: No focal deficit present.     Mental Status: She is alert and oriented to person, place, and time.  Psychiatric:        Mood and Affect: Mood normal.        Behavior: Behavior normal.        Thought Content: Thought content normal.        Judgment: Judgment normal.     UC Treatments / Results  Labs (all labs ordered are listed, but only abnormal results are displayed) Labs Reviewed  RESP PANEL BY RT-PCR (FLU A&B, COVID) [id number]    EKG   Radiology No results found.  Procedures Procedures (including critical care time)  Medications Ordered in UC Medications - No data to display  Initial Impression / Assessment and Plan / UC Course  I have reviewed the triage vital signs and the nursing notes.  Pertinent labs & imaging results that were available during my care of the patient were reviewed by me and considered in my medical decision making (see chart for details).  Patient is a very pleasant though ill-appearing 50 year old female here for evaluation of COVID-like symptoms that been going on for the past 4  to 5 days as outlined in HPI above.  Patient's physical exam reveals pearly gray tympanic membranes bilaterally with a normal light reflex and clear external auditory canals.  Nasal mucosa is erythematous edematous with scant clear nasal discharge.  Oropharyngeal exam reveals posterior oropharyngeal erythema and cobblestoning with clear postnasal drip.  Patient does have bilateral anterior cervical lymphadenopathy on exam.  Cardiopulmonary exam is benign.  Respiratory triplex panel collected at triage.  Patient is not vaccinated against COVID.  Respiratory triplex panel is negative for COVID or influenza.  Will discharge patient home with a diagnosis of viral illness.  Will give Zofran to help with nausea, patient is over-the-counter Tylenol and ibuprofen as needed for fever and body aches, oral rehydration, and rest.  Work note provided.   Final Clinical Impressions(s) / UC Diagnoses   Final diagnoses:  Viral illness  Diarrhea, unspecified type     Discharge Instructions      Use the Atrovent nasal spray, 2 squirts in each nostril every 6 hours, as needed for runny nose and postnasal drip.  Use the Zofran every 8 hours as needed for nausea.  Take over the counter Tylenol or ibuprofen according to the package instructions needed for fever and body aches.  Follow the food resource guide in your discharge instructions for food choices to help you alleviate your diarrhea.  You can also use over-the-counter Imodium as needed.  Return for reevaluation or see your primary care provider for any new or worsening symptoms.      ED Prescriptions  Medication Sig Dispense Auth. Provider   ondansetron (ZOFRAN ODT) 8 MG disintegrating tablet Take 1 tablet (8 mg total) by mouth every 8 (eight) hours as needed for nausea or vomiting. 20 tablet Margarette Canada, NP   ipratropium (ATROVENT) 0.06 % nasal spray Place 2 sprays into both nostrils 4 (four) times daily. 15 mL Margarette Canada, NP      PDMP  not reviewed this encounter.   Margarette Canada, NP 10/17/20 1734

## 2020-10-18 ENCOUNTER — Other Ambulatory Visit: Payer: Self-pay

## 2020-10-19 ENCOUNTER — Other Ambulatory Visit: Payer: Self-pay

## 2020-10-22 ENCOUNTER — Ambulatory Visit
Admission: EM | Admit: 2020-10-22 | Discharge: 2020-10-22 | Disposition: A | Discharge status: Home / Self Care | Payer: Commercial Managed Care - PPO | Attending: Family Medicine | Admitting: Family Medicine

## 2020-10-22 ENCOUNTER — Telehealth: Payer: Self-pay

## 2020-10-22 ENCOUNTER — Other Ambulatory Visit: Payer: Self-pay

## 2020-10-22 ENCOUNTER — Encounter: Payer: Self-pay | Admitting: Emergency Medicine

## 2020-10-22 DIAGNOSIS — J029 Acute pharyngitis, unspecified: Secondary | ICD-10-CM | POA: Diagnosis present

## 2020-10-22 LAB — POCT RAPID STREP A: Streptococcus, Group A Screen (Direct): NEGATIVE

## 2020-10-22 MED ORDER — AZITHROMYCIN 250 MG PO TABS: ORAL_TABLET | ORAL | 0 refills | Status: DC

## 2020-10-22 NOTE — ED Provider Notes (Signed)
[facility]    CSN: [id number] Arrival date & time: 10/22/20  1043      History   Chief Complaint Chief Complaint  Patient presents with   Sore Throat    HPI  50 year old female presents with sore throat.  Patient recently seen on 7/12.  She had multiple complaints at the time.  Flu and COVID testing negative.  She was treated symptomatically.  Patient reports that her symptoms have resolved except for the sore throat. Pain is severe. Interfering with sleep. Painful swallowing. No relieving factors.   Past Medical History:  Diagnosis Date   Anxiety    Depression    Heart murmur    Hypertension    Migraine    VSD (ventricular septal defect and aortic arch hypoplasia     Patient Active Problem List   Diagnosis Date Noted   Portal hypertension ([place])    Other diseases of stomach and duodenum    Secondary esophageal varices without bleeding (HCC)    Loss of weight    Iron deficiency anemia due to chronic blood loss    Gastritis without bleeding    Polyp of sigmoid colon    Abdominal pain, epigastric 03/17/2016   Symptomatic anemia 03/17/2016   Valvular heart disease 03/17/2016   Bradycardia 03/17/2016   Abdominal pain 03/17/2016    Past Surgical History:  Procedure Laterality Date   COLONOSCOPY WITH PROPOFOL N/A 03/19/2016   Procedure: COLONOSCOPY WITH PROPOFOL;  Surgeon: Lucilla Lame, MD;  Location: [facility];  Service: Endoscopy;  Laterality: N/A;   ESOPHAGOGASTRODUODENOSCOPY (EGD) WITH PROPOFOL N/A 03/19/2016   Procedure: ESOPHAGOGASTRODUODENOSCOPY (EGD) WITH PROPOFOL;  Surgeon: Lucilla Lame, MD;  Location: [facility];  Service: Endoscopy;  Laterality: N/A;   GANGLION CYST EXCISION  2008   left wrist   HAND SURGERY     cyst romved from left thumb   TUBAL LIGATION      OB History   No obstetric history on file.      Home Medications    Prior to Admission medications   Medication Sig Start Date End Date Taking? Authorizing Provider   azithromycin (ZITHROMAX) 250 MG tablet 2 tablets on day 1, then 1 tablet daily on days 2-5. 10/22/20  Yes Arrionna Serena G, DO  albuterol (VENTOLIN HFA) 108 (90 Base) MCG/ACT inhaler Inhale 2 puffs into the lungs every 4 (four) hours as needed. 04/16/20 04/16/21  [provider]  busPIRone (BUSPAR) 5 MG tablet Take 5 mg by mouth 2 (two) times daily. 08/28/20   [provider]  citalopram (CELEXA) 10 MG tablet TAKE ONE TABLET BY MOUTH AT BEDTIME 12/08/19 12/07/20  Boyce Medici, FNP  clonazePAM (KLONOPIN) 0.5 MG tablet Take 0.5 mg by mouth at bedtime as needed. 06/14/20   [provider]  feeding supplement (BOOST / RESOURCE BREEZE) LIQD Take 1 Container by mouth 3 (three) times daily between meals. 03/19/16   Epifanio Lesches, MD  hydrochlorothiazide (HYDRODIURIL) 25 MG tablet Take 1 tablet by mouth daily. 06/14/20   [provider]  ipratropium (ATROVENT) 0.06 % nasal spray Place 2 sprays into both nostrils 4 (four) times daily. 10/17/20   Margarette Canada, NP  mirtazapine (REMERON) 45 MG tablet TAKE ONE TABLET (45MG  TOTAL) BY MOUTH ONCE DAILY AT BEDTIME FOR MOOD AND SLEEP. 08/23/20     OLANZapine (ZYPREXA) 2.5 MG tablet TAKE ONE TABLET (2.5MG  TOTAL) BY MOUTH ONCE DAILY AT BEDTIME. 08/23/20     ondansetron (ZOFRAN ODT) 8 MG disintegrating tablet Take 1 tablet (  8 mg total) by mouth every 8 (eight) hours as needed for nausea or vomiting. 10/17/20   Margarette Canada, NP  fluticasone [facility]) 50 MCG/ACT nasal spray Place 2 sprays into both nostrils daily. Patient not taking: No sig reported 12/28/15 07/20/20  Menshew, Dannielle Karvonen, PA-C    Family History Family History  Problem Relation Age of Onset   Lung cancer Mother    Other Father        homicide    Social History Social History   Tobacco Use   Smoking status: Every Day    Packs/day: 0.50    Types: Cigarettes   Smokeless tobacco: Never  Vaping Use   Vaping Use: Never used  Substance Use Topics   Alcohol use: No    Drug use: Not Currently    Frequency: 2.0 times per week    Types: Marijuana     Allergies   Bee venom, Mushroom ext cmplx(shiitake-reishi-mait), Mobic [meloxicam], and Tramadol   Review of Systems Review of Systems  Constitutional:  Negative for fever.  HENT:  Positive for sore throat.    Physical Exam Triage Vital Signs ED Triage Vitals  Enc Vitals Group     BP 10/22/20 1053 (!) 144/85     Pulse Rate 10/22/20 1053 60     Resp 10/22/20 1053 14     Temp 10/22/20 1053 98.4 F (36.9 C)     Temp Source 10/22/20 1053 Oral     SpO2 10/22/20 1053 99 %     Weight 10/22/20 1051 140 lb (63.5 kg)     Height 10/22/20 1051 5\' 2"  (1.575 m)     Head Circumference --      Peak Flow --      Pain Score 10/22/20 1051 10     Pain Loc --      Pain Edu? --      Excl. in [place]? --    Updated Vital Signs BP (!) 144/85 (BP Location: Left Arm)   Pulse 60   Temp 98.4 F (36.9 C) (Oral)   Resp 14   Ht 5\' 2"  (1.575 m)   Wt 63.5 kg   SpO2 99%   BMI 25.61 kg/m   Visual Acuity Right Eye Distance:   Left Eye Distance:   Bilateral Distance:    Right Eye Near:   Left Eye Near:    Bilateral Near:     Physical Exam Vitals and nursing note reviewed.  Constitutional:      General: She is not in acute distress.    Appearance: Normal appearance. She is not ill-appearing.  HENT:     Head: Normocephalic and atraumatic.     Right Ear: Tympanic membrane normal.     Left Ear: Tympanic membrane normal.     Mouth/Throat:     Pharynx: Posterior oropharyngeal erythema present. No oropharyngeal exudate.  Cardiovascular:     Rate and Rhythm: Normal rate and regular rhythm.  Pulmonary:     Effort: Pulmonary effort is normal.     Breath sounds: Normal breath sounds. No wheezing, rhonchi or rales.  Neurological:     Mental Status: She is alert.     UC Treatments / Results  Labs (all labs ordered are listed, but only abnormal results are displayed) Labs Reviewed  CULTURE, GROUP A STREP [facility])   POCT RAPID STREP A, ED / UC  POCT RAPID STREP A    EKG   Radiology No results found.  Procedures Procedures (including critical care time)  Medications Ordered in UC Medications - No data to display  Initial Impression / Assessment and Plan / UC Course  I have reviewed the triage vital signs and the nursing notes.  Pertinent labs & imaging results that were available during my care of the patient were reviewed by me and considered in my medical decision making (see chart for details).    50 year old female presents with pharyngitis.  Rapid strep negative.  Awaiting culture.  Given lack of improvement, erythema on exam I am placing her on azithromycin while awaiting culture.  Final Clinical Impressions(s) / UC Diagnoses   Final diagnoses:  Pharyngitis, unspecified etiology     Discharge Instructions      Strep testing was negative.  Medication as directed while awaiting culture.  Take care  Dr. Lacinda Axon    ED Prescriptions     Medication Sig Dispense Auth. Provider   azithromycin (ZITHROMAX) 250 MG tablet 2 tablets on day 1, then 1 tablet daily on days 2-5. 6 tablet Coral Spikes, DO      PDMP not reviewed this encounter.   Coral Spikes, [place] 10/22/20 1153

## 2020-10-22 NOTE — ED Triage Notes (Signed)
Patient c/o ongoing sore throat.  Patient was seen on 10/17/20.  Patient denies fevers.

## 2020-10-22 NOTE — Discharge Instructions (Addendum)
Strep testing was negative.  Medication as directed while awaiting culture.  Take care  Dr. Lacinda Axon

## 2020-10-24 ENCOUNTER — Other Ambulatory Visit: Payer: Self-pay

## 2020-10-25 LAB — CULTURE, GROUP A STREP (THRC)

## 2020-10-26 ENCOUNTER — Other Ambulatory Visit: Payer: Self-pay

## 2020-10-27 ENCOUNTER — Other Ambulatory Visit: Payer: Self-pay

## 2020-10-27 MED ORDER — OLANZAPINE 2.5 MG PO TABS
ORAL_TABLET | ORAL | 2 refills | Status: DC
  Filled 2020-10-27 – 2020-11-27 (×2): qty 30, 30d supply, fill #0
  Filled 2020-12-25: qty 30, 30d supply, fill #1
  Filled 2021-01-28: qty 30, 30d supply, fill #2

## 2020-11-08 ENCOUNTER — Encounter: Payer: Self-pay | Admitting: Emergency Medicine

## 2020-11-08 ENCOUNTER — Other Ambulatory Visit: Payer: Self-pay

## 2020-11-08 ENCOUNTER — Ambulatory Visit
Admission: EM | Admit: 2020-11-08 | Discharge: 2020-11-08 | Disposition: A | Discharge status: Home / Self Care | Payer: Commercial Managed Care - PPO | Attending: Sports Medicine | Admitting: Sports Medicine

## 2020-11-08 ENCOUNTER — Ambulatory Visit (INDEPENDENT_AMBULATORY_CARE_PROVIDER_SITE_OTHER): Payer: Commercial Managed Care - PPO

## 2020-11-08 DIAGNOSIS — S8001XA Contusion of right knee, initial encounter: Secondary | ICD-10-CM | POA: Diagnosis not present

## 2020-11-08 DIAGNOSIS — S8002XA Contusion of left knee, initial encounter: Secondary | ICD-10-CM

## 2020-11-08 DIAGNOSIS — M25562 Pain in left knee: Secondary | ICD-10-CM

## 2020-11-08 DIAGNOSIS — M25561 Pain in right knee: Secondary | ICD-10-CM

## 2020-11-08 NOTE — Discharge Instructions (Addendum)
As we discussed, your x-rays do not show any fracture.  There is some chronic changes that have been there for a while.  The left is more involved than the right which is consistent with your symptoms. Please take daily ibuprofen as needed which you have at home. Please see educational handouts. I provided you with a work note keeping you out today and tomorrow. If your symptoms persist please see your primary care provider. I also gave you the name and number of a local orthopedic group that you have seen in the past.  He is to be [place] and has now [employer].  Please call them and make an appointment. If your symptoms worsen for any reason then go to the ER.
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
## 2020-11-25 ENCOUNTER — Other Ambulatory Visit: Payer: Self-pay

## 2020-11-27 ENCOUNTER — Other Ambulatory Visit: Payer: Self-pay

## 2020-11-27 MED ORDER — OLANZAPINE 2.5 MG PO TABS
2.5000 mg | ORAL_TABLET | ORAL | 2 refills | Status: DC
  Filled 2020-11-27 – 2021-01-29 (×3): qty 30, 30d supply, fill #0

## 2020-12-25 ENCOUNTER — Other Ambulatory Visit: Payer: Self-pay

## 2021-01-29 ENCOUNTER — Other Ambulatory Visit: Payer: Self-pay

## 2021-02-20 ENCOUNTER — Other Ambulatory Visit: Payer: Self-pay

## 2021-02-20 ENCOUNTER — Ambulatory Visit
Admission: EM | Admit: 2021-02-20 | Discharge: 2021-02-20 | Disposition: A | Discharge status: Home / Self Care | Payer: Commercial Managed Care - PPO | Attending: Emergency Medicine | Admitting: Emergency Medicine

## 2021-02-20 ENCOUNTER — Ambulatory Visit (INDEPENDENT_AMBULATORY_CARE_PROVIDER_SITE_OTHER): Payer: Commercial Managed Care - PPO

## 2021-02-20 DIAGNOSIS — S8992XA Unspecified injury of left lower leg, initial encounter: Secondary | ICD-10-CM | POA: Diagnosis not present

## 2021-02-20 DIAGNOSIS — M25562 Pain in left knee: Secondary | ICD-10-CM | POA: Diagnosis not present

## 2021-02-20 MED ORDER — ETODOLAC 500 MG PO TABS: 500.0000 mg | ORAL_TABLET | Freq: Two times a day (BID) | ORAL | 0 refills | Status: DC | PRN
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
## 2021-02-20 NOTE — Discharge Instructions (Addendum)
Recommend take Etodolac 500mg  1 tablet every 12 hours as needed for pain and swelling. Wear Knee brace for support. Try to elevated left leg and knee as much as possible. May apply ice to area for comfort as needed. If pain and swelling does not improve within 3 to 4 days, recommend follow-up with [facility] as discussed.

## 2021-02-20 NOTE — ED Triage Notes (Signed)
Pt here with C/O left knee injury today, didn't see water on floor and slipped, felt something pop in knee.

## 2021-03-07 ENCOUNTER — Other Ambulatory Visit: Payer: Self-pay

## 2021-03-08 ENCOUNTER — Other Ambulatory Visit: Payer: Self-pay

## 2021-03-08 MED ORDER — OLANZAPINE 2.5 MG PO TABS
ORAL_TABLET | ORAL | 2 refills | Status: AC
  Filled 2021-03-08: qty 30, 30d supply, fill #0
  Filled 2021-04-18: qty 30, 30d supply, fill #1

## 2021-03-08 MED ORDER — MIRTAZAPINE 45 MG PO TABS
ORAL_TABLET | ORAL | 5 refills | Status: AC
  Filled 2021-03-08: qty 30, 30d supply, fill #0
  Filled 2021-04-18: qty 30, 30d supply, fill #1

## 2021-03-09 ENCOUNTER — Other Ambulatory Visit: Payer: Self-pay

## 2021-04-10 ENCOUNTER — Other Ambulatory Visit: Payer: Self-pay

## 2021-04-10 ENCOUNTER — Ambulatory Visit
Admission: EM | Admit: 2021-04-10 | Discharge: 2021-04-10 | Disposition: A | Discharge status: Home / Self Care | Payer: Commercial Managed Care - PPO | Attending: Internal Medicine | Admitting: Internal Medicine

## 2021-04-10 ENCOUNTER — Encounter: Payer: Self-pay | Admitting: Emergency Medicine

## 2021-04-10 DIAGNOSIS — R52 Pain, unspecified: Secondary | ICD-10-CM | POA: Diagnosis present

## 2021-04-10 DIAGNOSIS — Z20822 Contact with and (suspected) exposure to covid-19: Secondary | ICD-10-CM | POA: Diagnosis not present

## 2021-04-10 DIAGNOSIS — J069 Acute upper respiratory infection, unspecified: Secondary | ICD-10-CM | POA: Diagnosis not present

## 2021-04-10 DIAGNOSIS — R051 Acute cough: Secondary | ICD-10-CM | POA: Diagnosis present

## 2021-04-10 LAB — RESP PANEL BY RT-PCR (FLU A&B, COVID) ARPGX2
Influenza A by PCR: NEGATIVE
Influenza B by PCR: NEGATIVE
SARS Coronavirus 2 by RT PCR: NEGATIVE

## 2021-04-10 MED ORDER — BENZONATATE 200 MG PO CAPS: 200.0000 mg | ORAL_CAPSULE | Freq: Three times a day (TID) | ORAL | 0 refills | Status: DC | PRN

## 2021-04-10 NOTE — ED Triage Notes (Signed)
Pt c/o cough, body aches, headache, sore throat, chills. Started about 3 days ago. 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

## 2021-04-10 NOTE — Discharge Instructions (Signed)
I will call you when your tests are back. I sent something for cough in the meant time Continue using your inhaler every 4-6 h for cough and wheezing for 7 days, then only as needed after that

## 2021-04-10 NOTE — ED Provider Notes (Signed)
[facility]    CSN: [id number] Arrival date & time: 04/10/21  6301      History   Chief Complaint Chief Complaint  Patient presents with   Generalized Body Aches   Cough    HPI Sherry Little is a 51 y.o. female who presents with onset of HA, cough, body aches, ST and chills x 2 days. She has been around people with covid at work. She tried to work last night, but could not endure finishing up her shift. Has mild nausea, decreased appetite and fatigue.     Past Medical History:  Diagnosis Date   Anxiety    Depression    Heart murmur    Hypertension    Migraine    VSD (ventricular septal defect and aortic arch hypoplasia     Patient Active Problem List   Diagnosis Date Noted   Portal hypertension ([place])    Other diseases of stomach and duodenum    Secondary esophageal varices without bleeding (HCC)    Loss of weight    Iron deficiency anemia due to chronic blood loss    Gastritis without bleeding    Polyp of sigmoid colon    Abdominal pain, epigastric 03/17/2016   Symptomatic anemia 03/17/2016   Valvular heart disease 03/17/2016   Bradycardia 03/17/2016   Abdominal pain 03/17/2016    Past Surgical History:  Procedure Laterality Date   COLONOSCOPY WITH PROPOFOL N/A 03/19/2016   Procedure: COLONOSCOPY WITH PROPOFOL;  Surgeon: Lucilla Lame, MD;  Location: [facility] END[facility]COPY;  Service: Endoscopy;  Laterality: N/A;   ESOPHAGOGASTRODUODENOSCOPY (EGD) WITH PROPOFOL N/A 03/19/2016   Procedure: ESOPHAGOGASTRODUODENOSCOPY (EGD) WITH PROPOFOL;  Surgeon: Lucilla Lame, MD;  Location: [facility] ENDOSCOPY;  Service: Endoscopy;  Laterality: N/A;   GANGLION CYST EXCISION  2008   left wrist   HAND SURGERY     cyst romved from left thumb   TUBAL LIGATION      OB History   No obstetric history on file.      Home Medications    Prior to Admission medications   Medication Sig Start Date End Date Taking? Authorizing Provider  albuterol (VENTOLIN HFA) 108 (90 Base) MCG/ACT  inhaler Inhale 2 puffs into the lungs every 4 (four) hours as needed. 04/16/20 04/16/21 Yes [provider]  benzonatate (TESSALON) 200 MG capsule Take 1 capsule (200 mg total) by mouth 3 (three) times daily as needed. 04/10/21  Yes Rodriguez-Southworth, Sunday Spillers, PA-C  clonazePAM (KLONOPIN) 0.5 MG tablet Take 0.5 mg by mouth at bedtime as needed. 06/14/20  Yes [provider]  hydrochlorothiazide (HYDRODIURIL) 25 MG tablet Take 1 tablet by mouth daily. 06/14/20  Yes [provider]  mirtazapine (REMERON) 45 MG tablet TAKE ONE TABLET BY MOUTH ONCE DAILY AT BEDTIME FOR MOOD AND SLEEP. 03/08/21  Yes   OLANZapine (ZYPREXA) 2.5 MG tablet TAKE ONE TABLET (2.5MG  TOTAL) BY MOUTH ONCE DAILY AT BEDTIME. 03/08/21  Yes   citalopram (CELEXA) 10 MG tablet TAKE ONE TABLET BY MOUTH AT BEDTIME 12/08/19 01/29/21  Boyce Medici, FNP  fluticasone (FLONASE) 50 MCG/ACT nasal spray Place 2 sprays into both nostrils daily. Patient not taking: No sig reported 12/28/15 07/20/20  Menshew, Dannielle Karvonen, PA-C    Family History Family History  Problem Relation Age of Onset   Lung cancer Mother    Other Father        homicide    Social History Social History   Tobacco Use   Smoking status: Every Day  Packs/day: 0.50    Types: Cigarettes   Smokeless tobacco: Never  Vaping Use   Vaping Use: Never used  Substance Use Topics   Alcohol use: No   Drug use: Not Currently    Frequency: 2.0 times per week    Types: Marijuana     Allergies   Bee venom, Mushroom ext cmplx(shiitake-reishi-mait), Mobic [meloxicam], and Tramadol   Review of Systems Review of Systems  Constitutional:  Positive for activity change, appetite change, chills, diaphoresis and fever.  HENT:  Positive for congestion, postnasal drip and rhinorrhea. Negative for ear discharge and ear pain.   Eyes:  Negative for discharge.  Skin:  Negative for rash.    Physical Exam Triage Vital Signs ED Triage Vitals  Enc Vitals Group      BP 04/10/21 0826 (!) 160/99     Pulse Rate 04/10/21 0826 67     Resp 04/10/21 0826 18     Temp 04/10/21 0826 99.1 F (37.3 C)     Temp Source 04/10/21 0826 Oral     SpO2 04/10/21 0826 97 %     Weight 04/10/21 0827 119 lb 14.9 oz (54.4 kg)     Height 04/10/21 0827 5\' 2"  (1.575 m)     Head Circumference --      Peak Flow --      Pain Score 04/10/21 0826 10     Pain Loc --      Pain Edu? --      Excl. in [place]? --    No data found.  Updated Vital Signs BP (!) 160/99 (BP Location: Right Arm)    Pulse 67    Temp 99.1 F (37.3 C) (Oral)    Resp 18    Ht 5\' 2"  (1.575 m)    Wt 119 lb 14.9 oz (54.4 kg)    LMP 06/30/2020 (Approximate)    SpO2 97%    BMI 21.94 kg/m   Visual Acuity Right Eye Distance:   Left Eye Distance:   Bilateral Distance:    Right Eye Near:   Left Eye Near:    Bilateral Near:     Physical Exam Physical Exam Vitals signs and nursing note reviewed.  Constitutional:      General: She is not in acute distress.    Appearance: Normal appearance. She is not ill-appearing, toxic-appearing or diaphoretic.  HENT:     Head: Normocephalic.     Right Ear: Tympanic membrane, ear canal and external ear normal.     Left Ear: Tympanic membrane, ear canal and external ear normal.     Nose: with clear rhinitis     Mouth/Throat: clear    Mouth: Mucous membranes are moist.  Eyes:     General: No scleral icterus.       Right eye: No discharge.        Left eye: No discharge.     Conjunctiva/sclera: Conjunctivae normal.  Neck:     Musculoskeletal: Neck supple. No neck rigidity.  Cardiovascular:     Rate and Rhythm: Normal rate and regular rhythm.     Heart sounds: No murmur.  Pulmonary:     Effort: Pulmonary effort is normal.     Breath sounds: Normal breath sounds.  Abdominal:     General: Bowel sounds are normal. There is no distension.     Palpations: Abdomen is soft. There is no mass.     Tenderness: There is no abdominal tenderness. There is no guarding or rebound.  Hernia: No hernia is present.  Musculoskeletal: Normal range of motion.  Lymphadenopathy:     Cervical: No cervical adenopathy.  Skin:    General: Skin is warm and dry.     Coloration: Skin is not jaundiced.     Findings: No rash.  Neurological:     Mental Status: She is alert and oriented to person, place, and time.     Gait: Gait normal.  Psychiatric:        Mood and Affect: Mood normal.        Behavior: Behavior normal.        Thought Content: Thought content normal.        Judgment: Judgment normal.    UC Treatments / Results  Labs (all labs ordered are listed, but only abnormal results are displayed) Labs Reviewed  RESP PANEL BY RT-PCR (FLU A&B, COVID) [id number]  Respiratory panel is neg  EKG   Radiology No results found.  Procedures Procedures (including critical care time)  Medications Ordered in UC Medications - No data to display  Initial Impression / Assessment and Plan / UC Course  I have reviewed the triage vital signs and the nursing notes. Pertinent labs  results that were available during my care of the patient were reviewed by me and considered in my medical decision making (see chart for details). URI I placed her on Tessalon as needed. Pt had to leave before her results were back due to having another appointment to be at. So I called her with her results.     Final Clinical Impressions(s) / UC Diagnoses   Final diagnoses:  Flu-like symptoms     Discharge Instructions      I will call you when your tests are back. I sent something for cough in the meant time Continue using your inhaler every 4-6 h for cough and wheezing for 7 days, then only as needed after that    ED Prescriptions     Medication Sig Dispense Auth. Provider   benzonatate (TESSALON) 200 MG capsule Take 1 capsule (200 mg total) by mouth 3 (three) times daily as needed. 30 capsule Rodriguez-Southworth, Sunday Spillers, PA-C      PDMP not reviewed this encounter.    Shelby Mattocks, PA-C 04/10/21 1024

## 2021-04-18 ENCOUNTER — Other Ambulatory Visit: Payer: Self-pay

## 2021-04-20 ENCOUNTER — Telehealth: Payer: Self-pay | Admitting: Pharmacist

## 2021-04-20 NOTE — Telephone Encounter (Signed)
A [employer] card was scanned on Sep 01, 2020 showing that the patient has prescription coverage with [employer].  I spoke with the patient Friday, April 20, 2021.  The patient denied knowing of the insurance coverage. The patient stated that she will call the insurance company to check on the status of the coverage. If the policy is not active, she will request an insurance termination letter.  Patient will follow up with [place] regarding the status of the healthcare prescription coverage. [place] [facility]

## 2021-05-25 ENCOUNTER — Telehealth: Payer: Self-pay | Admitting: Pharmacist

## 2021-05-25 NOTE — Telephone Encounter (Signed)
Patient has prescription drug coverage. No longer meets the eligibility requirements to obtain medication assistance from [facility]. Patient notified by letter. [place] [facility]

## 2021-05-28 ENCOUNTER — Other Ambulatory Visit: Payer: Self-pay

## 2021-05-31 ENCOUNTER — Other Ambulatory Visit: Payer: Self-pay

## 2021-07-10 ENCOUNTER — Other Ambulatory Visit: Payer: Self-pay

## 2021-07-10 MED ORDER — MIRTAZAPINE 45 MG PO TABS
45.0000 mg | ORAL_TABLET | Freq: Every evening | ORAL | 5 refills | Status: AC
  Filled 2021-07-10: qty 30, 30d supply, fill #0

## 2021-07-11 ENCOUNTER — Other Ambulatory Visit: Payer: Self-pay

## 2021-08-20 ENCOUNTER — Other Ambulatory Visit: Payer: Self-pay

## 2021-08-29 ENCOUNTER — Ambulatory Visit (INDEPENDENT_AMBULATORY_CARE_PROVIDER_SITE_OTHER): Payer: Self-pay | Admitting: Podiatry

## 2021-08-29 DIAGNOSIS — Z91199 Patient's noncompliance with other medical treatment and regimen due to unspecified reason: Secondary | ICD-10-CM

## 2021-08-29 NOTE — Progress Notes (Signed)
Patient was no-show for appointment today 

## 2021-09-12 ENCOUNTER — Encounter: Payer: Self-pay | Admitting: Podiatry

## 2021-09-12 ENCOUNTER — Ambulatory Visit (INDEPENDENT_AMBULATORY_CARE_PROVIDER_SITE_OTHER): Payer: Commercial Managed Care - PPO | Admitting: Podiatry

## 2021-09-12 ENCOUNTER — Ambulatory Visit (INDEPENDENT_AMBULATORY_CARE_PROVIDER_SITE_OTHER): Payer: Commercial Managed Care - PPO

## 2021-09-12 DIAGNOSIS — M79674 Pain in right toe(s): Secondary | ICD-10-CM

## 2021-09-12 DIAGNOSIS — M79675 Pain in left toe(s): Secondary | ICD-10-CM

## 2021-09-12 DIAGNOSIS — M722 Plantar fascial fibromatosis: Secondary | ICD-10-CM

## 2021-09-12 DIAGNOSIS — M779 Enthesopathy, unspecified: Secondary | ICD-10-CM

## 2021-09-12 DIAGNOSIS — M7751 Other enthesopathy of right foot: Secondary | ICD-10-CM | POA: Diagnosis not present

## 2021-09-12 DIAGNOSIS — B351 Tinea unguium: Secondary | ICD-10-CM | POA: Diagnosis not present

## 2021-09-12 MED ORDER — NAPROXEN 500 MG PO TABS: 500.0000 mg | ORAL_TABLET | Freq: Two times a day (BID) | ORAL | 0 refills | Status: AC

## 2021-09-12 MED ORDER — TERBINAFINE HCL 250 MG PO TABS: 250.0000 mg | ORAL_TABLET | Freq: Every day | ORAL | 0 refills | Status: DC

## 2021-09-12 NOTE — Patient Instructions (Signed)

## 2021-09-12 NOTE — Progress Notes (Signed)
  Subjective:  Patient ID: Sherry Little, female    DOB: 08/20/1970,  MRN: [id number]  Chief Complaint  Patient presents with   Foot Pain    "I have heel pain."   Nail Problem    "I have athlete's feet in my toenails."    51 y.o. female presents with the above complaint. History confirmed with patient.  She has 2 issues discolored thickened brown crumbly toenails that are uncomfortable as well as heel pain on the right.  They have progressively getting worse the big toe started and now spreading to the other toes.  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  Heel pain began slowly and is progressively worsened.  No acute injury that began this  Objective:  Physical Exam: warm, good capillary refill, no trophic changes or ulcerative lesions, normal DP and PT pulses, and normal sensory exam. Left Foot: dystrophic yellowed discolored nail plates with subungual debris Right Foot: point tenderness over the heel pad, point tenderness of the mid plantar fascia, and dystrophic yellowed discolored nail plates with subungual debris  No images are attached to the encounter.  Radiographs: Multiple views x-ray of the right foot: no fracture, dislocation, swelling or degenerative changes noted and prominent plantar calcaneal tubercle but no discrete spur Assessment:   1. Plantar fasciitis, right   2. Onychomycosis   3. Pain due to onychomycosis of toenails of both feet      Plan:  Patient was evaluated and treated and all questions answered.  Discussed the etiology and treatment options for plantar fasciitis including stretching, formal physical therapy, supportive shoegears such as a running shoe or sneaker, pre fabricated orthoses, injection therapy, and oral medications. We also discussed the role of surgical treatment of this for patients who do not improve after exhausting non-surgical treatment options.   -XR reviewed with patient -Educated patient on stretching and  icing of the affected limb -Plantar fascial brace dispensed -Injection delivered to the plantar fascia of the right foot. -Rx for Naprosyn 500 mg twice daily sent to pharmacy.  She had a rash from the Mobic but says she tolerates Aleve well   Discussed etiology and treatment options of onychomycosis in detail.  The nails were debrided in an excisional manner with a sharp nail nipper to a tolerable level.  I discussed oral and topical treatment for this.  I recommended oral treatment.  She has no history of liver disease and takes medications that should interact with this.  Rx for Lamisil 90-day course sent to pharmacy.  I will see her back in 6 weeks for Planter fasciitis I expect will most much progress in the nails for a few months.  May consider removal of the bilateral hallux nail if not improving.  Return in about 6 weeks (around 10/24/2021) for recheck plantar fasciitis.

## 2021-09-13 ENCOUNTER — Telehealth: Payer: Self-pay | Admitting: *Deleted

## 2021-09-13 NOTE — Telephone Encounter (Signed)
I am returning your call.  Dr. Sherryle Lis said the Naproxen doesn't come in capsule form.  "I can't take that.  I went to the Orthopedic before and they gave me that.  I broke out in whelps."  Okay, so you want him to prescribe something else?  "Yes, please"  The [facility] is [facility] [place], correct?  [facility] [place]"

## 2021-09-13 NOTE — Telephone Encounter (Signed)
"  The Naproxen Dr. Sherryle Lis prescribed is a tablet.  I cannot take the tablets.  Can he send it back in as a capsule?"

## 2021-09-14 NOTE — Telephone Encounter (Signed)
I attempted to return her call.  I left her a message and gave her Dr. Maxie Barb response which is , "What NSAIDs has she taken before? Aleve? Motrin? If she can't take any of those I can send a prednisone prescription. Otherwise she should just take tylenol."  I informed her to take Tylenol.  If the Tylenol doesn't help, he can call you in a prescription for Prednisone.

## 2021-09-26 ENCOUNTER — Telehealth: Payer: Self-pay | Admitting: *Deleted

## 2021-09-26 NOTE — Telephone Encounter (Signed)
She did not say, but saw there was another message when Sherry Little spoke to her about a medication as well... not sure if that was the prednisone or something else.

## 2021-09-26 NOTE — Telephone Encounter (Signed)
Patient left message on nurse VM stating she could take the medication Dr. Sherryle Lis sent in. Her foot is really itching a lot between her little toes. Please advise

## 2021-10-24 ENCOUNTER — Ambulatory Visit (INDEPENDENT_AMBULATORY_CARE_PROVIDER_SITE_OTHER): Payer: Commercial Managed Care - PPO | Admitting: Podiatry

## 2021-10-24 DIAGNOSIS — B351 Tinea unguium: Secondary | ICD-10-CM | POA: Diagnosis not present

## 2021-10-24 MED ORDER — TERBINAFINE HCL 250 MG PO TABS: 250.0000 mg | ORAL_TABLET | Freq: Every day | ORAL | 0 refills | Status: DC

## 2021-10-29 NOTE — Progress Notes (Signed)
  Subjective:  Patient ID: Sherry Little, female    DOB: 1970-10-04,  MRN: [id number]  Chief Complaint  Patient presents with   Plantar Fasciitis      6 week f/u plantar fasciitis right    51 y.o. female presents with the above complaint. History confirmed with patient.  She has 2 issues discolored thickened brown crumbly toenails that are uncomfortable as well as heel pain on the right.  They have progressively getting worse the big toe started and now spreading to the other toes.  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  Heel pain began slowly and is progressively worsened.  No acute injury that began this  Interval history: Planter fasciitis is doing much better she is not having any heel pain at all now.  She did not start taking the Lamisil yet  Objective:  Physical Exam: warm, good capillary refill, no trophic changes or ulcerative lesions, normal DP and PT pulses, and normal sensory exam. Left Foot: dystrophic yellowed discolored nail plates with subungual debris Right Foot: point tenderness over the heel pad, point tenderness of the mid plantar fascia, and dystrophic yellowed discolored nail plates with subungual debris    Radiographs: Multiple views x-ray of the right foot: no fracture, dislocation, swelling or degenerative changes noted and prominent plantar calcaneal tubercle but no discrete spur Assessment:   1. Onychomycosis      Plan:  Patient was evaluated and treated and all questions answered.  Planter fasciitis is resolved.  Continue home therapy plan until 100% pain-free.  Return for this as needed   Rx for Lamisil 90-day course recent pharmacy.  Discussed risk and possible side effects and use administration.  I will see her back in 4 months for follow-up of her toenail fungus  Return in about 4 months (around 02/24/2022) for follow up after nail fungus treatment.

## 2021-11-16 ENCOUNTER — Telehealth: Payer: Self-pay | Admitting: *Deleted

## 2021-11-16 DIAGNOSIS — B351 Tinea unguium: Secondary | ICD-10-CM

## 2021-11-16 MED ORDER — TERBINAFINE HCL 250 MG PO TABS: 250.0000 mg | ORAL_TABLET | Freq: Every day | ORAL | 0 refills | Status: AC

## 2021-11-16 NOTE — Telephone Encounter (Signed)
Patient is calling to request another prescription for Terbinafine, did not pick up last one prescribed. Please advise.

## 2021-11-19 NOTE — Telephone Encounter (Signed)
Called to inform,no answer, could not leave voice message.

## 2021-11-22 ENCOUNTER — Ambulatory Visit
Admission: RE | Admit: 2021-11-22 | Discharge: 2021-11-22 | Disposition: A | Discharge status: Home / Self Care | Payer: Self-pay | Source: Ambulatory Visit | Attending: Family Medicine | Admitting: Family Medicine

## 2021-11-22 ENCOUNTER — Other Ambulatory Visit: Payer: Self-pay | Admitting: Family Medicine

## 2021-11-22 ENCOUNTER — Ambulatory Visit
Admission: RE | Admit: 2021-11-22 | Discharge: 2021-11-22 | Disposition: A | Discharge status: Home / Self Care | Payer: Self-pay | Attending: Family Medicine | Admitting: Family Medicine

## 2021-11-22 DIAGNOSIS — R059 Cough, unspecified: Secondary | ICD-10-CM

## 2022-02-27 ENCOUNTER — Ambulatory Visit (INDEPENDENT_AMBULATORY_CARE_PROVIDER_SITE_OTHER): Payer: 59 | Admitting: Podiatry

## 2022-02-27 DIAGNOSIS — Z91199 Patient's noncompliance with other medical treatment and regimen due to unspecified reason: Secondary | ICD-10-CM

## 2022-02-27 NOTE — Progress Notes (Signed)
Patient was no-show for appointment today 

## 2022-03-27 ENCOUNTER — Ambulatory Visit (INDEPENDENT_AMBULATORY_CARE_PROVIDER_SITE_OTHER): Payer: 59 | Admitting: Podiatry

## 2022-03-27 DIAGNOSIS — B351 Tinea unguium: Secondary | ICD-10-CM

## 2022-03-27 DIAGNOSIS — M79675 Pain in left toe(s): Secondary | ICD-10-CM

## 2022-03-27 DIAGNOSIS — M79674 Pain in right toe(s): Secondary | ICD-10-CM

## 2022-03-27 NOTE — Progress Notes (Signed)
  Subjective:  Patient ID: Sherry Little, female    DOB: [birth year],  MRN: [id number]  Chief Complaint  Patient presents with   Nail Problem    follow up after nail fungus treatment.    51 y.o. female presents with the above complaint. History confirmed with patient.   Interval history: She notes improvement with the Lamisil.  Nails are thickened elongated and causing discomfort in shoe gear.  Objective:  Physical Exam: warm, good capillary refill, no trophic changes or ulcerative lesions, normal DP and PT pulses, and normal sensory exam.  Some proximal improvement overall unchanged in the great toenails Left Foot: dystrophic yellowed discolored nail plates with subungual debris Right Foot: dystrophic yellowed discolored nail plates with subungual debris    Radiographs: Multiple views x-ray of the right foot: no fracture, dislocation, swelling or degenerative changes noted and prominent plantar calcaneal tubercle but no discrete spur Assessment:   1. Pain due to onychomycosis of toenails of both feet      Plan:  Patient was evaluated and treated and all questions answered.  Discussed the etiology and treatment options for the condition in detail with the patient. Educated patient on the topical and oral treatment options for mycotic nails. Recommended debridement of the nails today. Sharp and mechanical debridement performed of all painful and mycotic nails today. Nails debrided in length and thickness using a nail nipper to level of comfort. Discussed treatment options including appropriate shoe gear. Follow up as needed for painful nails.  We discussed possible permanent removal of the great toenails.  For now she will return for regular nail debridements on a 57-monthbasis and return to see me as needed if she needs further treatment for plantar fasciitis or removal of the toenails permanently   Do not see further indication for Lamisil therapy at this point   Return in about  3 months (around 06/26/2022) for RFC.

## 2022-04-19 ENCOUNTER — Ambulatory Visit
Admission: EM | Admit: 2022-04-19 | Discharge: 2022-04-19 | Disposition: A | Discharge status: Home / Self Care | Payer: 59 | Attending: Emergency Medicine | Admitting: Emergency Medicine

## 2022-04-19 DIAGNOSIS — K2900 Acute gastritis without bleeding: Secondary | ICD-10-CM | POA: Diagnosis not present

## 2022-04-19 LAB — COMPREHENSIVE METABOLIC PANEL
ALT: 14 U/L (ref 0–44)
AST: 19 U/L (ref 15–41)
Albumin: 4.1 g/dL (ref 3.5–5.0)
Alkaline Phosphatase: 98 U/L (ref 38–126)
Anion gap: 9 (ref 5–15)
BUN: 15 mg/dL (ref 6–20)
CO2: 25 mmol/L (ref 22–32)
Calcium: 10.2 mg/dL (ref 8.9–10.3)
Chloride: 103 mmol/L (ref 98–111)
Creatinine, Ser: 0.66 mg/dL (ref 0.44–1.00)
GFR, Estimated: >60 mL/min (ref >60)
Glucose, Bld: 98 mg/dL (ref 70–99)
Potassium: 3.6 mmol/L (ref 3.5–5.1)
Sodium: 137 mmol/L (ref 135–145)
Total Bilirubin: 0.9 mg/dL (ref 0.3–1.2)
Total Protein: 7.7 g/dL (ref 6.5–8.1)

## 2022-04-19 LAB — CBC WITH DIFFERENTIAL/PLATELET
Abs Immature Granulocytes: 0.01 10*3/uL (ref 0.00–0.07)
Basophils Absolute: 0 10*3/uL (ref 0.0–0.1)
Basophils Relative: 0 %
Eosinophils Absolute: 0.1 10*3/uL (ref 0.0–0.5)
Eosinophils Relative: 2 %
HCT: 43.2 % (ref 36.0–46.0)
Hemoglobin: 14.6 g/dL (ref 12.0–15.0)
Immature Granulocytes: 0 %
Lymphocytes Relative: 34 %
Lymphs Abs: 2.2 10*3/uL (ref 0.7–4.0)
MCH: 29.7 pg (ref 26.0–34.0)
MCHC: 33.8 g/dL (ref 30.0–36.0)
MCV: 88 fL (ref 80.0–100.0)
Monocytes Absolute: 0.5 10*3/uL (ref 0.1–1.0)
Monocytes Relative: 7 %
Neutro Abs: 3.7 10*3/uL (ref 1.7–7.7)
Neutrophils Relative %: 57 %
Platelets: 277 10*3/uL (ref 150–400)
RBC: 4.91 MIL/uL (ref 3.87–5.11)
RDW: 13.7 % (ref 11.5–15.5)
WBC: 6.5 10*3/uL (ref 4.0–10.5)
nRBC: 0 % (ref 0.0–0.2)

## 2022-04-19 MED ORDER — ONDANSETRON 8 MG PO TBDP: 8.0000 mg | ORAL_TABLET | Freq: Once | ORAL | Status: DC

## 2022-04-19 MED ORDER — LIDOCAINE VISCOUS HCL 2 % MT SOLN
15.0000 mL | Freq: Once | OROMUCOSAL | Status: AC
Start: 2022-04-19 — End: 2022-04-19
  Administered 2022-04-19: 15 mL via OROMUCOSAL

## 2022-04-19 MED ORDER — ALUM & MAG HYDROXIDE-SIMETH 200-200-20 MG/5ML PO SUSP
30.0000 mL | Freq: Once | ORAL | Status: AC
Start: 2022-04-19 — End: 2022-04-19
  Administered 2022-04-19: 30 mL via ORAL

## 2022-04-19 MED ORDER — ONDANSETRON 4 MG PO TBDP
4.0000 mg | ORAL_TABLET | Freq: Once | ORAL | Status: AC
  Administered 2022-04-19: 4 mg via ORAL

## 2022-04-19 MED ORDER — ONDANSETRON 8 MG PO TBDP: 8.0000 mg | ORAL_TABLET | Freq: Three times a day (TID) | ORAL | 0 refills | Status: DC | PRN

## 2022-04-19 MED ORDER — OMEPRAZOLE 20 MG PO CPDR
20.0000 mg | DELAYED_RELEASE_CAPSULE | Freq: Every day | ORAL | 0 refills | Status: DC
Start: 2022-04-19 — End: 2023-06-09

## 2022-04-19 NOTE — ED Triage Notes (Signed)
Pt c/o nausea x2 days, denies any N/V/D,bodyaches or fevers.

## 2022-04-19 NOTE — ED Provider Notes (Signed)
[facility]    CSN: [id number] Arrival date & time: 04/19/22  0913      History   Chief Complaint Chief Complaint  Patient presents with   Nausea    HPI Sherry Little is a 52 y.o. female.   HPI  52 year old female here for evaluation of nausea.  The patient reports that she has been experiencing nausea for the last 2 days along with epigastric pain.  Associated with any vomiting or diarrhea.  Also no fever, chest pain, or dizziness.  Patient does have a history of epigastric abdominal pain, esophageal varices without bleeding, and gastritis.  She states that this feels different than her typical gastritis flares.  She does smoke but states she has not smoked lately because cigarettes taste bad to her.  She does not drink alcohol per her report.  Patient's blood pressure is elevated today at 186/102 and the patient does have a history of hypertension, portal hypertension, valvular heart disease, and anemia.  She did take her hydrochlorothiazide that she was prescribed for hypertension just prior to arrival at the urgent care.  The patient denies any rectal bleeding or passing of dark or tarry stools.  Past Medical History:  Diagnosis Date   Anxiety    Depression    Heart murmur    Hypertension    Migraine    VSD (ventricular septal defect and aortic arch hypoplasia     Patient Active Problem List   Diagnosis Date Noted   Portal hypertension ([place])    Other diseases of stomach and duodenum    Secondary esophageal varices without bleeding (HCC)    Loss of weight    Iron deficiency anemia due to chronic blood loss    Gastritis without bleeding    Polyp of sigmoid colon    Abdominal pain, epigastric 03/17/2016   Symptomatic anemia 03/17/2016   Valvular heart disease 03/17/2016   Bradycardia 03/17/2016   Abdominal pain 03/17/2016    Past Surgical History:  Procedure Laterality Date   COLONOSCOPY WITH PROPOFOL N/A 03/19/2016   Procedure: COLONOSCOPY WITH  PROPOFOL;  Surgeon: Lucilla Lame, MD;  Location: [facility] ENDOSCOPY;  Service: Endoscopy;  Laterality: N/A;   ESOPHAGOGASTRODUODENOSCOPY (EGD) WITH PROPOFOL N/A 03/19/2016   Procedure: ESOPHAGOGASTRODUODENOSCOPY (EGD) WITH PROPOFOL;  Surgeon: Lucilla Lame, MD;  Location: [facility] ENDOSCOPY;  Service: Endoscopy;  Laterality: N/A;   GANGLION CYST EXCISION  2008   left wrist   HAND SURGERY     cyst romved from left thumb   TUBAL LIGATION      OB History   No obstetric history on file.      Home Medications    Prior to Admission medications   Medication Sig Start Date End Date Taking? Authorizing Provider  hydrochlorothiazide (HYDRODIURIL) 25 MG tablet Take 1 tablet by mouth daily. 06/14/20  Yes [provider]  mirtazapine (REMERON) 45 MG tablet TAKE ONE TABLET BY MOUTH ONCE DAILY AT BEDTIME FOR MOOD AND SLEEP. 03/08/21  Yes   mirtazapine (REMERON) 45 MG tablet Take 1 tablet (45 mg total) by mouth once nightly at bedtime for mood and sleep. 07/10/21  Yes   OLANZapine (ZYPREXA) 2.5 MG tablet TAKE ONE TABLET (2.'5MG'$  TOTAL) BY MOUTH ONCE DAILY AT BEDTIME. 03/08/21  Yes   omeprazole (PRILOSEC) 20 MG capsule Take 1 capsule (20 mg total) by mouth daily. 04/19/22  Yes Margarette Canada, NP  ondansetron (ZOFRAN-ODT) 8 MG disintegrating tablet Take 1 tablet (8 mg total) by mouth every 8 (eight) hours as  needed for nausea or vomiting. 04/19/22  Yes Margarette Canada, NP  benzonatate (TESSALON) 200 MG capsule Take 1 capsule (200 mg total) by mouth 3 (three) times daily as needed. Patient not taking: Reported on 09/12/2021 04/10/21   Rodriguez-Southworth, Sunday Spillers, PA-C  clonazePAM (KLONOPIN) 0.5 MG tablet Take 0.5 mg by mouth at bedtime as needed. Patient not taking: Reported on 09/12/2021 06/14/20   [provider]  citalopram (CELEXA) 10 MG tablet TAKE ONE TABLET BY MOUTH AT BEDTIME 12/08/19 01/29/21  Boyce Medici, FNP  fluticasone (FLONASE) 50 MCG/ACT nasal spray Place 2 sprays into both nostrils daily. Patient not  taking: No sig reported 12/28/15 07/20/20  Menshew, Dannielle Karvonen, PA-C    Family History Family History  Problem Relation Age of Onset   Lung cancer Mother    Other Father        homicide    Social History Social History   Tobacco Use   Smoking status: Every Day    Packs/day: 1.00    Types: Cigarettes   Smokeless tobacco: Never  Vaping Use   Vaping Use: Never used  Substance Use Topics   Alcohol use: No   Drug use: Yes    Frequency: 2.0 times per week    Types: Marijuana     Allergies   Bee venom, Mushroom ext cmplx(shiitake-reishi-mait), Mobic [meloxicam], and Tramadol   Review of Systems Review of Systems  Constitutional:  Negative for fever.  Cardiovascular:  Negative for chest pain.  Gastrointestinal:  Positive for abdominal pain and nausea. Negative for anal bleeding, blood in stool, diarrhea and vomiting.  Neurological:  Negative for dizziness.     Physical Exam Triage Vital Signs ED Triage Vitals  Enc Vitals Group     BP 04/19/22 0924 (!) 186/102     Pulse Rate 04/19/22 0924 (!) 53     Resp 04/19/22 0924 16     Temp 04/19/22 0924 98 F (36.7 C)     Temp Source 04/19/22 0924 Oral     SpO2 04/19/22 0924 95 %     Weight 04/19/22 0923 125 lb (56.7 kg)     Height 04/19/22 0923 '5\' 2"'$  (1.575 m)     Head Circumference --      Peak Flow --      Pain Score 04/19/22 0922 0     Pain Loc --      Pain Edu? --      Excl. in [place]? --    No data found.  Updated Vital Signs BP (!) 186/102 (BP Location: Left Arm)   Pulse (!) 53   Temp 98 F (36.7 C) (Oral)   Resp 16   Ht '5\' 2"'$  (1.575 m)   Wt 125 lb (56.7 kg)   LMP 06/30/2020 (Approximate)   SpO2 95%   BMI 22.86 kg/m   Visual Acuity Right Eye Distance:   Left Eye Distance:   Bilateral Distance:    Right Eye Near:   Left Eye Near:    Bilateral Near:     Physical Exam Vitals and nursing note reviewed.  Constitutional:      Appearance: Normal appearance.  Neurological:     Mental Status: She is  alert.      UC Treatments / Results  Labs (all labs ordered are listed, but only abnormal results are displayed) Labs Reviewed  CBC WITH DIFFERENTIAL/PLATELET  COMPREHENSIVE METABOLIC PANEL    EKG   Radiology No results found.  Procedures Procedures (including critical care time)  Medications Ordered in UC Medications  alum & mag hydroxide-simeth (MAALOX/MYLANTA) 200-200-20 MG/5ML suspension 30 mL (30 mLs Oral Given 04/19/22 0944)  lidocaine (XYLOCAINE) 2 % viscous mouth solution 15 mL (15 mLs Mouth/Throat Given 04/19/22 0944)  ondansetron (ZOFRAN-ODT) disintegrating tablet 4 mg (4 mg Oral Given 04/19/22 0945)    Initial Impression / Assessment and Plan / UC Course  I have reviewed the triage vital signs and the nursing notes.  Pertinent labs & imaging results that were available during my care of the patient were reviewed by me and considered in my medical decision making (see chart for details).   Patient is a pleasant, nontoxic-appearing 52 year old female here for evaluation of nausea and epigastric pain that has been on for last 2 days.  No vomiting, diarrhea, rectal bleeding, or dark tarry stools.  No chest pain or dizziness and no fever.  The patient reports that when she eats she feels like food gets stuck at the entrance to her stomach.  She does have a history of gastritis and esophageal varices but is not currently being followed by GI.  She states that she has not seen GI in many years and does not remember who she saw.  She does have valvular heart disease and portal hypertension for which she is followed by [facility] cardiology.  Her last visit was in October.  She is hypertensive in clinic but also reports that she took her hydrochlorothiazide just prior to arrival.  On exam patient has clear lung sounds in all fields and her abdomen is soft and flat.  She does have epigastric tenderness but no right or left upper quadrant tenderness.  No lower abdominal tenderness at all.  No  guarding or rebound.  I suspect that patient has a flare of her gastritis and we will order a GI cocktail to see if her symptoms improve.  I will also for Zofran to help her with her nausea.  I am going to check a CBC and CMP to look for any elevations of liver enzymes or any signs of anemia which could indicate variceal bleeding.  If her blood work is negative and she has relief from the GI cocktail I will discharge her home with a diagnosis of gastritis and start her on a PPI.  If the GI cocktail is not helpful and her blood work is normal I will have her follow-up with GI for an endoscopy.  CBC is unremarkable and shows a normal white count of 6.5, normal red count of 4.91, normal H&H of 14.6 and 43.2, and normal platelets of 277.  CMP shows normal electrolytes, renal function, and transaminases.  The Zofran has relieved her nausea and the GI cocktail has helped calm down the epigastric discomfort.  I do believe this is a flare of her gastritis and I will treat her as such.  I will prescribe Zofran that she can use at home as needed for nausea along with start her on omeprazole 20 mg daily.  I have encouraged her to make a follow-up with her gastroenterologist for reevaluation.  I will give the patient a work note to cover her for today.  ER precautions reviewed.   Final Clinical Impressions(s) / UC Diagnoses   Final diagnoses:  Acute gastritis without hemorrhage, unspecified gastritis type     Discharge Instructions      Your blood work was normal and I believe that this is an episode of gastritis.  Use the Zofran every 8 hours as needed for nausea.  Start the omeprazole tonight and take it each night at bedtime.  This will decrease the amount of stomach acid being produced and should help you with your gastritis.  You need to make an appointment with your gastroenterologist to have a reevaluation and possible scope of your esophagus to ensure that there is not worsening of your  varices.  If you develop any vomiting of blood or dark tarry stools you need to go to the ER for evaluation.     ED Prescriptions     Medication Sig Dispense Auth. Provider   ondansetron (ZOFRAN-ODT) 8 MG disintegrating tablet Take 1 tablet (8 mg total) by mouth every 8 (eight) hours as needed for nausea or vomiting. 20 tablet Margarette Canada, NP   omeprazole (PRILOSEC) 20 MG capsule Take 1 capsule (20 mg total) by mouth daily. 30 capsule Margarette Canada, NP      PDMP not reviewed this encounter.   Margarette Canada, NP 04/19/22 1034

## 2022-04-19 NOTE — Discharge Instructions (Addendum)
Your blood work was normal and I believe that this is an episode of gastritis.  Use the Zofran every 8 hours as needed for nausea.  Start the omeprazole tonight and take it each night at bedtime.  This will decrease the amount of stomach acid being produced and should help you with your gastritis.  You need to make an appointment with your gastroenterologist to have a reevaluation and possible scope of your esophagus to ensure that there is not worsening of your varices.  If you develop any vomiting of blood or dark tarry stools you need to go to the ER for evaluation.

## 2022-05-10 ENCOUNTER — Ambulatory Visit
Admission: EM | Admit: 2022-05-10 | Discharge: 2022-05-10 | Disposition: A | Discharge status: Home / Self Care | Payer: BLUE CROSS/BLUE SHIELD | Attending: Physician Assistant | Admitting: Physician Assistant

## 2022-05-10 ENCOUNTER — Encounter: Payer: Self-pay | Admitting: Emergency Medicine

## 2022-05-10 DIAGNOSIS — R0981 Nasal congestion: Secondary | ICD-10-CM | POA: Diagnosis not present

## 2022-05-10 DIAGNOSIS — F17210 Nicotine dependence, cigarettes, uncomplicated: Secondary | ICD-10-CM | POA: Insufficient documentation

## 2022-05-10 DIAGNOSIS — R051 Acute cough: Secondary | ICD-10-CM

## 2022-05-10 DIAGNOSIS — Z1152 Encounter for screening for COVID-19: Secondary | ICD-10-CM | POA: Insufficient documentation

## 2022-05-10 DIAGNOSIS — Z20828 Contact with and (suspected) exposure to other viral communicable diseases: Secondary | ICD-10-CM | POA: Diagnosis not present

## 2022-05-10 DIAGNOSIS — B349 Viral infection, unspecified: Secondary | ICD-10-CM | POA: Diagnosis not present

## 2022-05-10 LAB — RESP PANEL BY RT-PCR (RSV, FLU A&B, COVID)  RVPGX2
Influenza A by PCR: NEGATIVE
Influenza B by PCR: NEGATIVE
Resp Syncytial Virus by PCR: NEGATIVE
SARS Coronavirus 2 by RT PCR: NEGATIVE

## 2022-05-10 MED ORDER — PROMETHAZINE-DM 6.25-15 MG/5ML PO SYRP: 5.0000 mL | ORAL_SOLUTION | Freq: Four times a day (QID) | ORAL | 0 refills | Status: DC | PRN

## 2022-05-10 NOTE — ED Provider Notes (Signed)
[facility]    CSN: [id number] Arrival date & time: 05/10/22  1815      History   Chief Complaint Chief Complaint  Patient presents with   Generalized Body Aches   Nasal Congestion   Cough    HPI Sherry Little is a 52 y.o. female presenting for headache, fatigue, body aches, cough, congestion, sore throat with onset yesterday.  Patient has been around her sister who recently tested positive for the flu.  She has not had any fevers but has been taking DayQuil.  She is not reporting any breathing difficulty or wheezing, vomiting or diarrhea.  No other complaints.  HPI  Past Medical History:  Diagnosis Date   Anxiety    Depression    Heart murmur    Hypertension    Migraine    VSD (ventricular septal defect and aortic arch hypoplasia     Patient Active Problem List   Diagnosis Date Noted   Portal hypertension ([place])    Other diseases of stomach and duodenum    Secondary esophageal varices without bleeding (HCC)    Loss of weight    Iron deficiency anemia due to chronic blood loss    Gastritis without bleeding    Polyp of sigmoid colon    Abdominal pain, epigastric 03/17/2016   Symptomatic anemia 03/17/2016   Valvular heart disease 03/17/2016   Bradycardia 03/17/2016   Abdominal pain 03/17/2016    Past Surgical History:  Procedure Laterality Date   COLONOSCOPY WITH PROPOFOL N/A 03/19/2016   Procedure: COLONOSCOPY WITH PROPOFOL;  Surgeon: Lucilla Lame, MD;  Location: [facility];  Service: Endoscopy;  Laterality: N/A;   ESOPHAGOGASTRODUODENOSCOPY (EGD) WITH PROPOFOL N/A 03/19/2016   Procedure: ESOPHAGOGASTRODUODENOSCOPY (EGD) WITH PROPOFOL;  Surgeon: Lucilla Lame, MD;  Location: [facility];  Service: Endoscopy;  Laterality: N/A;   GANGLION CYST EXCISION  2008   left wrist   HAND SURGERY     cyst romved from left thumb   TUBAL LIGATION      OB History   No obstetric history on file.      Home Medications    Prior to Admission medications    Medication Sig Start Date End Date Taking? Authorizing Provider  hydrochlorothiazide (HYDRODIURIL) 25 MG tablet Take 1 tablet by mouth daily. 06/14/20  Yes [provider]  mirtazapine (REMERON) 45 MG tablet TAKE ONE TABLET BY MOUTH ONCE DAILY AT BEDTIME FOR MOOD AND SLEEP. 03/08/21  Yes   OLANZapine (ZYPREXA) 2.5 MG tablet TAKE ONE TABLET (2.'5MG'$  TOTAL) BY MOUTH ONCE DAILY AT BEDTIME. 03/08/21  Yes   omeprazole (PRILOSEC) 20 MG capsule Take 1 capsule (20 mg total) by mouth daily. 04/19/22  Yes Margarette Canada, NP  promethazine-dextromethorphan (PROMETHAZINE-DM) 6.25-15 MG/5ML syrup Take 5 mLs by mouth 4 (four) times daily as needed. 05/10/22  Yes Danton Clap, PA-C  clonazePAM (KLONOPIN) 0.5 MG tablet Take 0.5 mg by mouth at bedtime as needed. Patient not taking: Reported on 09/12/2021 06/14/20   [provider]  mirtazapine (REMERON) 45 MG tablet Take 1 tablet (45 mg total) by mouth once nightly at bedtime for mood and sleep. 07/10/21     ondansetron (ZOFRAN-ODT) 8 MG disintegrating tablet Take 1 tablet (8 mg total) by mouth every 8 (eight) hours as needed for nausea or vomiting. 04/19/22   Margarette Canada, NP  citalopram (CELEXA) 10 MG tablet TAKE ONE TABLET BY MOUTH AT BEDTIME 12/08/19 01/29/21  Boyce Medici, FNP  fluticasone (FLONASE) 50 MCG/ACT nasal spray Place 2  sprays into both nostrils daily. Patient not taking: No sig reported 12/28/15 07/20/20  Menshew, Dannielle Karvonen, PA-C    Family History Family History  Problem Relation Age of Onset   Lung cancer Mother    Other Father        homicide    Social History Social History   Tobacco Use   Smoking status: Every Day    Packs/day: 1.00    Types: Cigarettes   Smokeless tobacco: Never  Vaping Use   Vaping Use: Never used  Substance Use Topics   Alcohol use: No   Drug use: Yes    Frequency: 2.0 times per week    Types: Marijuana     Allergies   Bee venom, Mushroom ext cmplx(shiitake-reishi-mait), Mobic [meloxicam], and  Tramadol   Review of Systems Review of Systems  Constitutional:  Positive for chills and fatigue. Negative for diaphoresis and fever.  HENT:  Positive for congestion, rhinorrhea and sore throat. Negative for ear pain, sinus pressure and sinus pain.   Respiratory:  Positive for cough. Negative for shortness of breath.   Gastrointestinal:  Negative for abdominal pain, nausea and vomiting.  Musculoskeletal:  Positive for myalgias.  Skin:  Negative for rash.  Neurological:  Positive for headaches. Negative for weakness.  Hematological:  Negative for adenopathy.     Physical Exam Triage Vital Signs ED Triage Vitals  Enc Vitals Group     BP      Pulse      Resp      Temp      Temp src      SpO2      Weight      Height      Head Circumference      Peak Flow      Pain Score      Pain Loc      Pain Edu?      Excl. in [place]?    No data found.  Updated Vital Signs BP (!) 161/82 (BP Location: Left Arm)   Pulse (!) 57   Temp 98.3 F (36.8 C) (Oral)   Resp 14   Ht '5\' 2"'$  (1.575 m)   Wt 130 lb (59 kg)   LMP 06/30/2020 (Approximate)   SpO2 98%   BMI 23.78 kg/m   Physical Exam Vitals and nursing note reviewed.  Constitutional:      General: She is not in acute distress.    Appearance: Normal appearance. She is ill-appearing. She is not toxic-appearing.  HENT:     Head: Normocephalic and atraumatic.     Nose: Congestion present.     Mouth/Throat:     Mouth: Mucous membranes are moist.     Pharynx: Oropharynx is clear. Posterior oropharyngeal erythema present.  Eyes:     General: No scleral icterus.       Right eye: No discharge.        Left eye: No discharge.     Conjunctiva/sclera: Conjunctivae normal.  Cardiovascular:     Rate and Rhythm: Regular rhythm. Bradycardia present.     Heart sounds: Normal heart sounds.  Pulmonary:     Effort: Pulmonary effort is normal. No respiratory distress.     Breath sounds: Normal breath sounds.  Musculoskeletal:     Cervical  back: Neck supple.  Skin:    General: Skin is dry.  Neurological:     General: No focal deficit present.     Mental Status: She is alert. Mental status is at  baseline.     Motor: No weakness.     Gait: Gait normal.  Psychiatric:        Mood and Affect: Mood normal.        Behavior: Behavior normal.        Thought Content: Thought content normal.      UC Treatments / Results  Labs (all labs ordered are listed, but only abnormal results are displayed) Labs Reviewed  RESP PANEL BY RT-PCR (RSV, FLU A&B, COVID)  [id number]    EKG   Radiology No results found.  Procedures Procedures (including critical care time)  Medications Ordered in UC Medications - No data to display  Initial Impression / Assessment and Plan / UC Course  I have reviewed the triage vital signs and the nursing notes.  Pertinent labs & imaging results that were available during my care of the patient were reviewed by me and considered in my medical decision making (see chart for details).   52 year old female presents for fatigue, bodies, cough congestion sore throat since yesterday.  Exposed to the flu.  Presently afebrile.  Ill-appearing but nontoxic.  Exam has nasal congestion and mild posterior pharyngeal erythema.  No respiratory stress.  Chest clear to auscultation heart regular rhythm.  Respiratory panel obtained.  Negative flu.  Discussed result with patient.  Reviewed supportive care, return to ER precautions.  Sent Promethazine DM to pharmacy encourage plenty of rest and fluids.  Reviewed return as needed especially if not feeling better over the next couple of weeks or worsening symptoms.   Final Clinical Impressions(s) / UC Diagnoses   Final diagnoses:  Acute cough  Nasal congestion  Viral illness     Discharge Instructions      -Negative flu test.  Sent cough medicine. - Increase rest and fluids. -Can return to work when fever free and feeling better. -You will need to be seen  again for any uncontrolled fever, weakness or breathing difficulty. - Should be feeling better in 1 to 2 weeks.     ED Prescriptions     Medication Sig Dispense Auth. Provider   promethazine-dextromethorphan (PROMETHAZINE-DM) 6.25-15 MG/5ML syrup Take 5 mLs by mouth 4 (four) times daily as needed. 118 mL Danton Clap, PA-C      PDMP not reviewed this encounter.   Danton Clap, PA-C 05/10/22 1941

## 2022-05-10 NOTE — Discharge Instructions (Addendum)
-  Negative flu test.  Sent cough medicine. - Increase rest and fluids. -Can return to work when fever free and feeling better. -You will need to be seen again for any uncontrolled fever, weakness or breathing difficulty. - Should be feeling better in 1 to 2 weeks.

## 2022-05-10 NOTE — ED Triage Notes (Signed)
Patient c/o headache, cough, sore throat, runny nose, and bodyaches that started yesterday.  Patient unsure of fevers.  Patient was around her sister who has the flu.

## 2022-05-27 ENCOUNTER — Ambulatory Visit
Admission: EM | Admit: 2022-05-27 | Discharge: 2022-05-27 | Disposition: A | Discharge status: Home / Self Care | Payer: BLUE CROSS/BLUE SHIELD | Attending: Emergency Medicine | Admitting: Emergency Medicine

## 2022-05-27 ENCOUNTER — Ambulatory Visit (INDEPENDENT_AMBULATORY_CARE_PROVIDER_SITE_OTHER): Payer: BLUE CROSS/BLUE SHIELD

## 2022-05-27 DIAGNOSIS — J40 Bronchitis, not specified as acute or chronic: Secondary | ICD-10-CM | POA: Diagnosis not present

## 2022-05-27 MED ORDER — ALBUTEROL SULFATE HFA 108 (90 BASE) MCG/ACT IN AERS: 2.0000 | INHALATION_SPRAY | RESPIRATORY_TRACT | 0 refills | Status: AC | PRN

## 2022-05-27 MED ORDER — AEROCHAMBER MV MISC: 2 refills | Status: AC

## 2022-05-27 MED ORDER — IPRATROPIUM BROMIDE 0.06 % NA SOLN: 2.0000 | Freq: Four times a day (QID) | NASAL | 12 refills | Status: AC

## 2022-05-27 MED ORDER — DOXYCYCLINE HYCLATE 100 MG PO CAPS: 100.0000 mg | ORAL_CAPSULE | Freq: Two times a day (BID) | ORAL | 0 refills | Status: DC

## 2022-05-27 MED ORDER — BENZONATATE 100 MG PO CAPS: 200.0000 mg | ORAL_CAPSULE | Freq: Three times a day (TID) | ORAL | 0 refills | Status: DC

## 2022-05-27 MED ORDER — PROMETHAZINE-DM 6.25-15 MG/5ML PO SYRP: 5.0000 mL | ORAL_SOLUTION | Freq: Four times a day (QID) | ORAL | 0 refills | Status: DC | PRN

## 2022-05-27 NOTE — ED Triage Notes (Addendum)
Pt c/o c/o cough, runny nose x2 weeks. Pt states cough has gotten worse, cough is productive

## 2022-05-27 NOTE — Discharge Instructions (Signed)
Your chest x-ray did not show any evidence of pneumonia but I do believe that you have bronchitis.  Given the period of time that you have been dealing with a cough and your purulent sputum production I am going to put you on some antibiotics.  Take the doxycycline twice daily with food for 10 days for treatment of your bronchitis.  Use the albuterol inhaler with a spacer, 1 to 2 puffs every 4-6 hours, as needed for shortness of breath and wheezing.  Use the Tessalon Perles every 8 hours during the day as needed for cough.  Take them a small sip of water.  They may give you some numbness to the base of your tongue or metallic taste in her mouth, this is normal.  Use the Atrovent nasal spray, 2 squirts up each nostril every 6 hours, as needed for runny nose and nasal congestion.  Use the Promethazine DM cough syrup at bedtime as needed for cough and congestion.  Return for reevaluation for any new or worsening symptoms.

## 2022-05-27 NOTE — ED Provider Notes (Signed)
[facility]    CSN: [id number] Arrival date & time: 05/27/22  1713      History   Chief Complaint Chief Complaint  Patient presents with   Cough   Nasal Congestion    HPI Sherry Little is a 52 y.o. female.   HPI  52 year old female here for evaluation of respiratory complaints.  Patient reports that she has been experiencing runny nose with nasal discharge that was initially green and now is clear for the past 2 weeks.  She is also been experiencing a cough and states her cough is now productive for a brown sputum with streaks of blood in it.  This is associated with shortness of breath and wheezing as well.  She denies measured fever but she has had rigors and chills.  She also denies sore throat.  Past Medical History:  Diagnosis Date   Anxiety    Depression    Heart murmur    Hypertension    Migraine    VSD (ventricular septal defect and aortic arch hypoplasia     Patient Active Problem List   Diagnosis Date Noted   Portal hypertension ([place])    Other diseases of stomach and duodenum    Secondary esophageal varices without bleeding (HCC)    Loss of weight    Iron deficiency anemia due to chronic blood loss    Gastritis without bleeding    Polyp of sigmoid colon    Abdominal pain, epigastric 03/17/2016   Symptomatic anemia 03/17/2016   Valvular heart disease 03/17/2016   Bradycardia 03/17/2016   Abdominal pain 03/17/2016    Past Surgical History:  Procedure Laterality Date   COLONOSCOPY WITH PROPOFOL N/A 03/19/2016   Procedure: COLONOSCOPY WITH PROPOFOL;  Surgeon: Lucilla Lame, MD;  Location: [facility] ENDOSCOPY;  Service: Endoscopy;  Laterality: N/A;   ESOPHAGOGASTRODUODENOSCOPY (EGD) WITH PROPOFOL N/A 03/19/2016   Procedure: ESOPHAGOGASTRODUODENOSCOPY (EGD) WITH PROPOFOL;  Surgeon: Lucilla Lame, MD;  Location: [facility] ENDOSCOPY;  Service: Endoscopy;  Laterality: N/A;   GANGLION CYST EXCISION  2008   left wrist   HAND SURGERY     cyst romved from left  thumb   TUBAL LIGATION      OB History   No obstetric history on file.      Home Medications    Prior to Admission medications   Medication Sig Start Date End Date Taking? Authorizing Provider  albuterol (VENTOLIN HFA) 108 (90 Base) MCG/ACT inhaler Inhale 2 puffs into the lungs every 4 (four) hours as needed. 05/27/22  Yes Margarette Canada, NP  benzonatate (TESSALON) 100 MG capsule Take 2 capsules (200 mg total) by mouth every 8 (eight) hours. 05/27/22  Yes Margarette Canada, NP  doxycycline (VIBRAMYCIN) 100 MG capsule Take 1 capsule (100 mg total) by mouth 2 (two) times daily. 05/27/22  Yes Margarette Canada, NP  ipratropium (ATROVENT) 0.06 % nasal spray Place 2 sprays into both nostrils 4 (four) times daily. 05/27/22  Yes Margarette Canada, NP  promethazine-dextromethorphan (PROMETHAZINE-DM) 6.25-15 MG/5ML syrup Take 5 mLs by mouth 4 (four) times daily as needed. 05/27/22  Yes Margarette Canada, NP  Spacer/Aero-Holding Chambers (AEROCHAMBER MV) inhaler Use as instructed 05/27/22  Yes Margarette Canada, NP  albuterol (VENTOLIN HFA) 108 (90 Base) MCG/ACT inhaler Inhale 2 puffs into the lungs every 4 (four) hours as needed. 04/16/20 05/28/23  [provider]  clonazePAM (KLONOPIN) 0.5 MG tablet Take 0.5 mg by mouth at bedtime as needed. Patient not taking: Reported on 09/12/2021 06/14/20   [provider]  hydrochlorothiazide (HYDRODIURIL) 25 MG tablet Take 1 tablet by mouth daily. 06/14/20   [provider]  mirtazapine (REMERON) 45 MG tablet TAKE ONE TABLET BY MOUTH ONCE DAILY AT BEDTIME FOR MOOD AND SLEEP. 03/08/21     mirtazapine (REMERON) 45 MG tablet Take 1 tablet (45 mg total) by mouth once nightly at bedtime for mood and sleep. 07/10/21     OLANZapine (ZYPREXA) 2.5 MG tablet TAKE ONE TABLET (2.5MG TOTAL) BY MOUTH ONCE DAILY AT BEDTIME. 03/08/21     omeprazole (PRILOSEC) 20 MG capsule Take 1 capsule (20 mg total) by mouth daily. 04/19/22   Margarette Canada, NP  ondansetron (ZOFRAN-ODT) 8 MG disintegrating  tablet Take 1 tablet (8 mg total) by mouth every 8 (eight) hours as needed for nausea or vomiting. 04/19/22   Margarette Canada, NP  citalopram (CELEXA) 10 MG tablet TAKE ONE TABLET BY MOUTH AT BEDTIME 12/08/19 01/29/21  Boyce Medici, FNP  fluticasone (FLONASE) 50 MCG/ACT nasal spray Place 2 sprays into both nostrils daily. Patient not taking: No sig reported 12/28/15 07/20/20  Menshew, Dannielle Karvonen, PA-C    Family History Family History  Problem Relation Age of Onset   Lung cancer Mother    Other Father        homicide    Social History Social History   Tobacco Use   Smoking status: Every Day    Packs/day: 1.00    Types: Cigarettes   Smokeless tobacco: Never  Vaping Use   Vaping Use: Never used  Substance Use Topics   Alcohol use: No   Drug use: Yes    Frequency: 2.0 times per week    Types: Marijuana     Allergies   Bee venom, Mushroom ext cmplx(shiitake-reishi-mait), Other, Mobic [meloxicam], and Tramadol   Review of Systems Review of Systems  Constitutional:  Positive for chills. Negative for fever.  HENT:  Positive for congestion and rhinorrhea. Negative for sore throat.   Respiratory:  Positive for cough, shortness of breath and wheezing.      Physical Exam Triage Vital Signs ED Triage Vitals [05/27/22 1803]  Enc Vitals Group     BP (!) 169/88     Pulse Rate 67     Resp      Temp 98.6 F (37 C)     Temp Source Oral     SpO2 98 %     Weight      Height      Head Circumference      Peak Flow      Pain Score      Pain Loc      Pain Edu?      Excl. in [place]?    No data found.  Updated Vital Signs BP (!) 169/88 (BP Location: Left Arm)   Pulse 67   Temp 98.6 F (37 C) (Oral)   LMP 06/30/2020 (Approximate)   SpO2 98%   Visual Acuity Right Eye Distance:   Left Eye Distance:   Bilateral Distance:    Right Eye Near:   Left Eye Near:    Bilateral Near:     Physical Exam Vitals and nursing note reviewed.  Constitutional:      Appearance: Normal  appearance. She is ill-appearing.  HENT:     Head: Normocephalic and atraumatic.     Right Ear: Tympanic membrane, ear canal and external ear normal. There is no impacted cerumen.     Left Ear: Tympanic membrane, ear canal and external ear normal. There  is no impacted cerumen.     Nose: Congestion and rhinorrhea present.     Comments: Mucosa is markedly edematous with mild erythema.  Nasal discharge is clear.    Mouth/Throat:     Mouth: Mucous membranes are moist.     Pharynx: Oropharynx is clear. No oropharyngeal exudate or posterior oropharyngeal erythema.  Cardiovascular:     Rate and Rhythm: Normal rate and regular rhythm.     Pulses: Normal pulses.     Heart sounds: Normal heart sounds. No murmur heard.    No friction rub. No gallop.  Pulmonary:     Effort: Pulmonary effort is normal.     Breath sounds: Rales present. No wheezing or rhonchi.  Musculoskeletal:     Cervical back: Normal range of motion and neck supple.  Skin:    General: Skin is warm.     Capillary Refill: Capillary refill takes less than 2 seconds.  Neurological:     General: No focal deficit present.     Mental Status: She is alert and oriented to person, place, and time.  Psychiatric:        Mood and Affect: Mood normal.        Behavior: Behavior normal.        Thought Content: Thought content normal.        Judgment: Judgment normal.      UC Treatments / Results  Labs (all labs ordered are listed, but only abnormal results are displayed) Labs Reviewed - No data to display  EKG   Radiology DG Chest 2 View  Result Date: 05/27/2022 CLINICAL DATA:  Cough for 2 weeks, initial encounter EXAM: CHEST - 2 VIEW COMPARISON:  11/22/2021 FINDINGS: The heart size and mediastinal contours are within normal limits. Both lungs are clear. The visualized skeletal structures are unremarkable. IMPRESSION: No active cardiopulmonary disease. Electronically Signed   By: Inez Catalina M.D.   On: 05/27/2022 18:36     Procedures Procedures (including critical care time)  Medications Ordered in UC Medications - No data to display  Initial Impression / Assessment and Plan / UC Course  I have reviewed the triage vital signs and the nursing notes.  Pertinent labs & imaging results that were available during my care of the patient were reviewed by me and considered in my medical decision making (see chart for details).   Is a pleasant, though ill-appearing, 52 year old female here for evaluation of 2 weeks with a respiratory symptoms as outlined in HPI above.  The patient does have inflamed nasal mucosa with scant clear discharge in both nares.  Oropharyngeal exam is benign.  Cardiopulmonary exam reveals coarse lung sounds in the left upper lobe posteriorly.  The remainder the lung fields are clear to auscultation.  The patient states that she has felt chilled but has not measured a fever at home.  The family member with the patient says that she has had shaking chills which sound like rigors.  Patient is afebrile in clinic and her saturation is 98% on room air.  Heart rate is 67.  Respiratory rate is approximately 18.  I will obtain a chest x-ray to evaluate for the presence of possible pneumonia.  Radiology impression of chest x-ray states no active cardiopulmonary disease.  I will discharge patient home with a diagnosis of bronchitis and started on doxycycline 100 mg twice daily for 10 days.  Albuterol inhaler with spacer, 1 to 2 puffs every 4-6 hours as needed for shortness breath and wheezing.  Tessalon  Perles for cough during the day and Promethazine DM cough syrup for cough at bedtime.  Atrovent nasal spray for the nasal congestion.  Work note provided.   Final Clinical Impressions(s) / UC Diagnoses   Final diagnoses:  Bronchitis     Discharge Instructions      Your chest x-ray did not show any evidence of pneumonia but I do believe that you have bronchitis.  Given the period of time that you  have been dealing with a cough and your purulent sputum production I am going to put you on some antibiotics.  Take the doxycycline twice daily with food for 10 days for treatment of your bronchitis.  Use the albuterol inhaler with a spacer, 1 to 2 puffs every 4-6 hours, as needed for shortness of breath and wheezing.  Use the Tessalon Perles every 8 hours during the day as needed for cough.  Take them a small sip of water.  They may give you some numbness to the base of your tongue or metallic taste in her mouth, this is normal.  Use the Atrovent nasal spray, 2 squirts up each nostril every 6 hours, as needed for runny nose and nasal congestion.  Use the Promethazine DM cough syrup at bedtime as needed for cough and congestion.  Return for reevaluation for any new or worsening symptoms.     ED Prescriptions     Medication Sig Dispense Auth. Provider   benzonatate (TESSALON) 100 MG capsule Take 2 capsules (200 mg total) by mouth every 8 (eight) hours. 21 capsule Margarette Canada, NP   doxycycline (VIBRAMYCIN) 100 MG capsule Take 1 capsule (100 mg total) by mouth 2 (two) times daily. 20 capsule Margarette Canada, NP   ipratropium (ATROVENT) 0.06 % nasal spray Place 2 sprays into both nostrils 4 (four) times daily. 15 mL Margarette Canada, NP   promethazine-dextromethorphan (PROMETHAZINE-DM) 6.25-15 MG/5ML syrup Take 5 mLs by mouth 4 (four) times daily as needed. 118 mL Margarette Canada, NP   albuterol (VENTOLIN HFA) 108 (90 Base) MCG/ACT inhaler Inhale 2 puffs into the lungs every 4 (four) hours as needed. 18 g Margarette Canada, NP   Spacer/Aero-Holding Chambers (AEROCHAMBER MV) inhaler Use as instructed 1 each Margarette Canada, NP      PDMP not reviewed this encounter.   Margarette Canada, NP 05/27/22 1844

## 2022-06-26 ENCOUNTER — Ambulatory Visit: Payer: 59 | Admitting: Podiatry

## 2022-07-04 ENCOUNTER — Ambulatory Visit: Payer: 59 | Admitting: Podiatry

## 2022-07-17 ENCOUNTER — Emergency Department
Admission: EM | Admit: 2022-07-17 | Discharge: 2022-07-17 | Disposition: A | Discharge status: Home / Self Care | Payer: BLUE CROSS/BLUE SHIELD | Attending: Emergency Medicine | Admitting: Emergency Medicine

## 2022-07-17 ENCOUNTER — Other Ambulatory Visit: Payer: Self-pay

## 2022-07-17 ENCOUNTER — Encounter: Payer: Self-pay | Admitting: Intensive Care

## 2022-07-17 DIAGNOSIS — R21 Rash and other nonspecific skin eruption: Secondary | ICD-10-CM

## 2022-07-17 DIAGNOSIS — I10 Essential (primary) hypertension: Secondary | ICD-10-CM | POA: Insufficient documentation

## 2022-07-17 DIAGNOSIS — L299 Pruritus, unspecified: Secondary | ICD-10-CM | POA: Diagnosis not present

## 2022-07-17 MED ORDER — DESONIDE 0.05 % EX CREA: TOPICAL_CREAM | CUTANEOUS | 1 refills | Status: AC

## 2022-07-17 MED ORDER — LORATADINE 10 MG PO TABS
10.0000 mg | ORAL_TABLET | Freq: Once | ORAL | Status: AC
  Administered 2022-07-17: 10 mg via ORAL
  Filled 2022-07-17: qty 1

## 2022-07-17 MED ORDER — TRIAMCINOLONE ACETONIDE 0.1 % EX CREA: 1.0000 | TOPICAL_CREAM | Freq: Two times a day (BID) | CUTANEOUS | 0 refills | Status: DC

## 2022-07-17 NOTE — ED Provider Notes (Addendum)
[facility] Provider Note    Event Date/Time   First MD Initiated Contact with Patient 07/17/22 1129     (approximate)   History   Rash   HPI  Sherry Little is a 52 y.o. female  who presents with itching.  Patient states yesterday she developed itching in the left eye.  Then felt itching on the volar surface of the left forearm.  Subsequently developed itching most of her body including her knees.  Denies any new exposures.  Denies history of similar.  No recent illnesses fevers chills etc.  Had tried Benadryl which did not help much.  Denies opiate use or other drugs.    Past Medical History:  Diagnosis Date   Anxiety    Depression    Heart murmur    Hypertension    Migraine    VSD (ventricular septal defect and aortic arch hypoplasia     Patient Active Problem List   Diagnosis Date Noted   Portal hypertension    Other diseases of stomach and duodenum    Secondary esophageal varices without bleeding    Loss of weight    Iron deficiency anemia due to chronic blood loss    Gastritis without bleeding    Polyp of sigmoid colon    Abdominal pain, epigastric 03/17/2016   Symptomatic anemia 03/17/2016   Valvular heart disease 03/17/2016   Bradycardia 03/17/2016   Abdominal pain 03/17/2016     Physical Exam  Triage Vital Signs: ED Triage Vitals [07/17/22 1128]  Enc Vitals Group     BP (!) 166/88     Pulse Rate (!) 58     Resp 16     Temp 97.7 F (36.5 C)     Temp Source Oral     SpO2 100 %     Weight 125 lb (56.7 kg)     Height 5\' 2"  (1.575 m)     Head Circumference      Peak Flow      Pain Score 8     Pain Loc      Pain Edu?      Excl. in [place]?     Most recent vital signs: Vitals:   07/17/22 1128  BP: (!) 166/88  Pulse: (!) 58  Resp: 16  Temp: 97.7 F (36.5 C)  SpO2: 100%     General: Awake, no distress.  CV:  Good peripheral perfusion.  Resp:  Normal effort.  Abd:  No distention.  Neuro:             Awake, Alert,  Oriented x 3  Other:  Scattered subtle excoriation on the dorsal part of the left forearm but no other significant rash no other rash noted throughout the body   ED Results / Procedures / Treatments  Labs (all labs ordered are listed, but only abnormal results are displayed) Labs Reviewed - No data to display   EKG     RADIOLOGY    PROCEDURES:  Critical Care performed: No  Procedures   MEDICATIONS ORDERED IN ED: Medications  loratadine (CLARITIN) tablet 10 mg (10 mg Oral Given 07/17/22 1153)     IMPRESSION / MDM / ASSESSMENT AND PLAN / ED COURSE  I reviewed the triage vital signs and the nursing notes.                              Patient's presentation is most consistent with  acute, uncomplicated illness.  Differential diagnosis includes, but is not limited to, atopic dermatitis, contact dermatitis, psychogenic pruritus  Patient is a 52 year old female presents with itching.  Started yesterday on her left eye spread to her arm and then is itching over most of the body worse over her knees.  Denies any new exposures or history of similar.  She is not on any opiates and denies other drug use other than marijuana.  No history of malignancy recently/night sweats etc.  Coordination on her left forearm but not appreciate much of a rash on the rest of her body.  Differentials as above.  Will treat with topical steroids triamcinolone and desonide for face and intertriginous areas.       FINAL CLINICAL IMPRESSION(S) / ED DIAGNOSES   Final diagnoses:  None     Rx / DC Orders   ED Discharge Orders          Ordered    triamcinolone cream (KENALOG) 0.1 %  2 times daily        07/17/22 1149    desonide (DESOWEN) 0.05 % cream        07/17/22 1149             Note:  This document was prepared using Dragon voice recognition software and may include unintentional dictation errors.   Georga Hacking, MD 07/17/22 1215    Georga Hacking, MD 07/17/22  1215

## 2022-07-17 NOTE — Discharge Instructions (Addendum)
I suspect that you have either eczema or contact dermatitis.  Please use the steroid cream for itching.  You can use the triamcinolone on most of your body but do not put it on your face armpits or your groin.  For those regions you can use the desonide.

## 2022-07-17 NOTE — ED Triage Notes (Signed)
Patient reports she has rash under left eye and on left arm since yesterday. Reports rash is itchy

## 2022-07-17 NOTE — ED Notes (Signed)
ED Provider at bedside. 

## 2022-07-25 ENCOUNTER — Ambulatory Visit: Payer: BLUE CROSS/BLUE SHIELD | Admitting: Podiatry

## 2022-07-25 ENCOUNTER — Encounter: Payer: Self-pay | Admitting: Podiatry

## 2022-07-25 VITALS — BP 140/82 | HR 60

## 2022-07-25 DIAGNOSIS — M79674 Pain in right toe(s): Secondary | ICD-10-CM

## 2022-07-25 DIAGNOSIS — B351 Tinea unguium: Secondary | ICD-10-CM | POA: Diagnosis not present

## 2022-07-25 DIAGNOSIS — M79675 Pain in left toe(s): Secondary | ICD-10-CM

## 2022-07-25 NOTE — Progress Notes (Signed)
This patient presents to the office with chief complaint of long thick painful nails.  Patient says the nails are painful walking and wearing shoes.  This patient is unable to self treat.  This patient is unable to trim her nails since she is unable to reach her nails.  She presents to the office for preventative foot care services.  General Appearance  Alert, conversant and in no acute stress.  Vascular  Dorsalis pedis and posterior tibial  pulses are palpable  bilaterally.  Capillary return is within normal limits  bilaterally. Temperature is within normal limits  bilaterally.  Neurologic  Senn-Weinstein monofilament wire test within normal limits  bilaterally. Muscle power within normal limits bilaterally.  Nails Thick disfigured discolored nails with subungual debris  hallux  bilaterally. No evidence of bacterial infection or drainage bilaterally.  Orthopedic  No limitations of motion  feet .  No crepitus or effusions noted.  No bony pathology or digital deformities noted.  Skin  normotropic skin with no porokeratosis noted bilaterally.  No signs of infections or ulcers noted.     Onychomycosis  Nails  B/L.  Pain in right toes  Pain in left toes  Debridement of nails both feet followed trimming the nails with dremel tool.    RTC 3 months.   Shaden Higley DPM   

## 2022-09-04 ENCOUNTER — Other Ambulatory Visit: Payer: Self-pay

## 2022-09-04 ENCOUNTER — Emergency Department: Payer: BLUE CROSS/BLUE SHIELD

## 2022-09-04 ENCOUNTER — Emergency Department
Admission: EM | Admit: 2022-09-04 | Discharge: 2022-09-04 | Disposition: A | Discharge status: Home / Self Care | Payer: BLUE CROSS/BLUE SHIELD | Attending: Emergency Medicine | Admitting: Emergency Medicine

## 2022-09-04 DIAGNOSIS — I10 Essential (primary) hypertension: Secondary | ICD-10-CM | POA: Diagnosis not present

## 2022-09-04 DIAGNOSIS — R197 Diarrhea, unspecified: Secondary | ICD-10-CM | POA: Diagnosis not present

## 2022-09-04 DIAGNOSIS — R110 Nausea: Secondary | ICD-10-CM

## 2022-09-04 DIAGNOSIS — R112 Nausea with vomiting, unspecified: Secondary | ICD-10-CM | POA: Diagnosis present

## 2022-09-04 DIAGNOSIS — R1012 Left upper quadrant pain: Secondary | ICD-10-CM | POA: Insufficient documentation

## 2022-09-04 LAB — COMPREHENSIVE METABOLIC PANEL
ALT: 12 U/L (ref 0–44)
AST: 20 U/L (ref 15–41)
Albumin: 4.4 g/dL (ref 3.5–5.0)
Alkaline Phosphatase: 109 U/L (ref 38–126)
Anion gap: 12 (ref 5–15)
BUN: 12 mg/dL (ref 6–20)
CO2: 23 mmol/L (ref 22–32)
Calcium: 11 mg/dL — ABNORMAL HIGH (ref 8.9–10.3)
Chloride: 103 mmol/L (ref 98–111)
Creatinine, Ser: 0.67 mg/dL (ref 0.44–1.00)
GFR, Estimated: >60 mL/min (ref >60)
Glucose, Bld: 99 mg/dL (ref 70–99)
Potassium: 2.9 mmol/L — ABNORMAL LOW (ref 3.5–5.1)
Sodium: 138 mmol/L (ref 135–145)
Total Bilirubin: 1 mg/dL (ref 0.3–1.2)
Total Protein: 7.9 g/dL (ref 6.5–8.1)

## 2022-09-04 LAB — CBC
HCT: 45.1 % (ref 36.0–46.0)
Hemoglobin: 15.5 g/dL — ABNORMAL HIGH (ref 12.0–15.0)
MCH: 29.3 pg (ref 26.0–34.0)
MCHC: 34.4 g/dL (ref 30.0–36.0)
MCV: 85.3 fL (ref 80.0–100.0)
Platelets: 305 10*3/uL (ref 150–400)
RBC: 5.29 MIL/uL — ABNORMAL HIGH (ref 3.87–5.11)
RDW: 12.9 % (ref 11.5–15.5)
WBC: 8.4 10*3/uL (ref 4.0–10.5)
nRBC: 0 % (ref 0.0–0.2)

## 2022-09-04 LAB — LIPASE, BLOOD: Lipase: 28 U/L (ref 11–51)

## 2022-09-04 MED ORDER — MAGNESIUM SULFATE IN D5W 1-5 GM/100ML-% IV SOLN
1.0000 g | Freq: Once | INTRAVENOUS | Status: AC
  Administered 2022-09-04: 1 g via INTRAVENOUS
  Filled 2022-09-04: qty 100

## 2022-09-04 MED ORDER — SODIUM CHLORIDE 0.9 % IV BOLUS
1000.0000 mL | Freq: Once | INTRAVENOUS | Status: AC
  Administered 2022-09-04: 1000 mL via INTRAVENOUS

## 2022-09-04 MED ORDER — POTASSIUM CHLORIDE CRYS ER 20 MEQ PO TBCR
40.0000 meq | EXTENDED_RELEASE_TABLET | Freq: Once | ORAL | Status: AC
  Administered 2022-09-04: 40 meq via ORAL
  Filled 2022-09-04: qty 2

## 2022-09-04 MED ORDER — METOCLOPRAMIDE HCL 10 MG PO TABS: 10.0000 mg | ORAL_TABLET | Freq: Three times a day (TID) | ORAL | 1 refills | Status: DC | PRN

## 2022-09-04 MED ORDER — DICYCLOMINE HCL 10 MG PO CAPS: 10.0000 mg | ORAL_CAPSULE | Freq: Three times a day (TID) | ORAL | 0 refills | Status: DC

## 2022-09-04 MED ORDER — HYDROXYZINE HCL 25 MG PO TABS
25.0000 mg | ORAL_TABLET | Freq: Once | ORAL | Status: AC
  Administered 2022-09-04: 25 mg via ORAL
  Filled 2022-09-04: qty 1

## 2022-09-04 MED ORDER — IOHEXOL 300 MG/ML  SOLN
100.0000 mL | Freq: Once | INTRAMUSCULAR | Status: AC | PRN
  Administered 2022-09-04: 100 mL via INTRAVENOUS

## 2022-09-04 MED ORDER — DICYCLOMINE HCL 10 MG/ML IM SOLN
10.0000 mg | Freq: Once | INTRAMUSCULAR | Status: AC
  Administered 2022-09-04: 10 mg via INTRAMUSCULAR
  Filled 2022-09-04: qty 2

## 2022-09-04 MED ORDER — ONDANSETRON HCL 4 MG/2ML IJ SOLN
4.0000 mg | Freq: Once | INTRAMUSCULAR | Status: AC
  Administered 2022-09-04: 4 mg via INTRAVENOUS
  Filled 2022-09-04: qty 2

## 2022-09-04 MED ORDER — POTASSIUM CHLORIDE 10 MEQ/100ML IV SOLN
10.0000 meq | INTRAVENOUS | Status: AC
  Administered 2022-09-04 (×2): 10 meq via INTRAVENOUS
  Filled 2022-09-04 (×2): qty 100

## 2022-09-04 MED ORDER — METOCLOPRAMIDE HCL 5 MG/ML IJ SOLN
10.0000 mg | Freq: Once | INTRAMUSCULAR | Status: AC
  Administered 2022-09-04: 10 mg via INTRAVENOUS
  Filled 2022-09-04: qty 2

## 2022-09-04 NOTE — ED Provider Notes (Signed)
[facility] Provider Note  Patient Contact: 3:05 PM (approximate)   History   Nausea   HPI  Sherry Little is a 52 y.o. female with a history of hypertension, migraines, anxiety and depression, presents to the emergency department with left upper quadrant abdominal pain for the past 3 days with associated vomiting and diarrhea.  Patient states that her symptoms started with some nasal congestion and some other viral URI-like symptoms.  She denies cough, chest tightness or chest pain.  She denies fever or chills at home.  No dysuria, hematuria or increased urinary frequency.  She denies a history of abdominal surgeries and states that she has not started any new medications recently.      Physical Exam   Triage Vital Signs: ED Triage Vitals  Enc Vitals Group     BP 09/04/22 1359 (!) 162/89     Pulse Rate 09/04/22 1359 (!) 52     Resp 09/04/22 1359 16     Temp 09/04/22 1359 97.9 F (36.6 C)     Temp src --      SpO2 09/04/22 1359 100 %     Weight 09/04/22 1400 130 lb (59 kg)     Height 09/04/22 1400 5\' 2"  (1.575 m)     Head Circumference --      Peak Flow --      Pain Score 09/04/22 1400 4     Pain Loc --      Pain Edu? --      Excl. in [place]? --     Most recent vital signs: Vitals:   09/04/22 1359  BP: (!) 162/89  Pulse: (!) 52  Resp: 16  Temp: 97.9 F (36.6 C)  SpO2: 100%     General: Alert and in no acute distress. Eyes:  PERRL. EOMI. Head: No acute traumatic findings ENT:      Nose: No congestion/rhinnorhea.      Mouth/Throat: Mucous membranes are moist. Neck: No stridor. No cervical spine tenderness to palpation. Cardiovascular:  Good peripheral perfusion Respiratory: Normal respiratory effort without tachypnea or retractions. Lungs CTAB. Good air entry to the bases with no decreased or absent breath sounds. Gastrointestinal: Bowel sounds 4 quadrants. Soft and tender to palpation in LUQ. No guarding or rigidity. No palpable masses.  No distention. No CVA tenderness. Musculoskeletal: Full range of motion to all extremities.  Neurologic:  No gross focal neurologic deficits are appreciated.  Skin:   No rash noted Other:   ED Results / Procedures / Treatments   Labs (all labs ordered are listed, but only abnormal results are displayed) Labs Reviewed  COMPREHENSIVE METABOLIC PANEL - Abnormal; Notable for the following components:      Result Value   Potassium 2.9 (*)    Calcium 11.0 (*)    All other components within normal limits  CBC - Abnormal; Notable for the following components:   RBC 5.29 (*)    Hemoglobin 15.5 (*)    All other components within normal limits  LIPASE, BLOOD  URINALYSIS, ROUTINE W REFLEX MICROSCOPIC     RADIOLOGY  I personally viewed and evaluated these images as part of my medical decision making, as well as reviewing the written report by the radiologist.  ED Provider Interpretation: CT abdomen pelvis shows no acute abnormality.   PROCEDURES:  Critical Care performed: No  Procedures   MEDICATIONS ORDERED IN ED: Medications  sodium chloride 0.9 % bolus 1,000 mL (0 mLs Intravenous Stopped 09/04/22 1909)  ondansetron (ZOFRAN) injection 4 mg (4 mg Intravenous Given 09/04/22 1518)  dicyclomine (BENTYL) injection 10 mg (10 mg Intramuscular Given 09/04/22 1602)  potassium chloride 10 mEq in 100 mL IVPB (0 mEq Intravenous Stopped 09/04/22 1804)  potassium chloride SA (KLOR-CON M) CR tablet 40 mEq (40 mEq Oral Given 09/04/22 1601)  magnesium sulfate IVPB 1 g 100 mL (0 g Intravenous Stopped 09/04/22 1804)  iohexol (OMNIPAQUE) 300 MG/ML solution 100 mL (100 mLs Intravenous Contrast Given 09/04/22 1527)  metoCLOPramide (REGLAN) injection 10 mg (10 mg Intravenous Given 09/04/22 1705)  sodium chloride 0.9 % bolus 1,000 mL (0 mLs Intravenous Stopped 09/04/22 1804)  hydrOXYzine (ATARAX) tablet 25 mg (25 mg Oral Given 09/04/22 1802)     IMPRESSION / MDM / ASSESSMENT AND PLAN / ED COURSE  I  reviewed the triage vital signs and the nursing notes.                              Assessment and plan Abdominal pain 52 year old female presents to the emergency department with 3 days of left upper quadrant abdominal pain with some associated nausea and vomiting and diarrhea with prodrome of nasal congestion.  Patient was hypertensive and bradycardic at triage.  On exam, patient seemed subdued and had left upper quadrant tenderness without guarding to palpation.  She had no abdominal scars or striae to inspection.  Differential diagnosis includes gastroenteritis, cannabinoid hyperemesis syndrome, small bowel obstruction, electrolyte abnormality, dehydration.  Patient CBC indicated a normal white blood cell count.  Lipase was within range.  CMP indicated mild hypokalemia at 2.9.  Will administer normal saline bolus, IV Zofran, Bentyl and 20 mEq of IV potassium and 40 meq equivalents of oral potassium and will reassess patient.  Will obtain CT abdomen pelvis and will reassess.   CT abdomen pelvis shows no acute abnormality.  Patient felt better after 2 L of normal saline, Zofran, Bentyl and Reglan.  She was discharged with Bentyl and Reglan.  Return precautions were given to return with new or worsening symptoms.  FINAL CLINICAL IMPRESSION(S) / ED DIAGNOSES   Final diagnoses:  Nausea     Rx / DC Orders   ED Discharge Orders          Ordered    metoCLOPramide (REGLAN) 10 MG tablet  Every 8 hours PRN        09/04/22 1932    dicyclomine (BENTYL) 10 MG capsule  3 times daily before meals & bedtime        09/04/22 1933             Note:  This document was prepared using Dragon voice recognition software and may include unintentional dictation errors.   Pia Mau [place]on, Cordelia Poche 09/04/22 2014    Phineas Semen, MD 09/04/22 2026

## 2022-09-04 NOTE — ED Provider Triage Note (Signed)
Emergency Medicine Provider Triage Evaluation Note  Sherry Little , a 52 y.o. female  was evaluated in triage.  Pt complains of nausea and diarrhea x 2 days.  Denies fever or sick contacts.  Associated symptoms include generalized body aches.  No other complaints at this time.  Physical Exam  BP (!) 162/89   Pulse (!) 52   Temp 97.9 F (36.6 C)   Resp 16   Ht 5\' 2"  (1.575 m)   Wt 59 kg   LMP 06/30/2020 (Approximate)   SpO2 100%   BMI 23.78 kg/m  Gen:   Awake, no distress   Resp:  Normal effort  MSK:   Moves extremities without difficulty     Medical Decision Making  Medically screening exam initiated at 2:05 PM.  Appropriate orders placed.  Alda L Jhaveri was informed that the remainder of the evaluation will be completed by another provider, this initial triage assessment does not replace that evaluation, and the importance of remaining in the ED until their evaluation is complete.     Romeo Apple, Deneshia Zucker A, PA-C 09/04/22 1408

## 2022-09-04 NOTE — Discharge Instructions (Addendum)
You can take Reglan for nausea up to three times daily.

## 2022-09-04 NOTE — ED Triage Notes (Signed)
Pt to ED for nausea x2 days. Started having diarrhea today. C/o generalized bodyaches. NAD noted.

## 2022-09-05 ENCOUNTER — Telehealth: Payer: Self-pay | Admitting: Emergency Medicine

## 2022-09-05 MED ORDER — ONDANSETRON 4 MG PO TBDP: 4.0000 mg | ORAL_TABLET | Freq: Three times a day (TID) | ORAL | 0 refills | Status: AC | PRN

## 2022-10-25 ENCOUNTER — Ambulatory Visit: Payer: BLUE CROSS/BLUE SHIELD | Admitting: Podiatry

## 2022-11-01 ENCOUNTER — Ambulatory Visit: Payer: BLUE CROSS/BLUE SHIELD | Admitting: Podiatry

## 2022-11-08 ENCOUNTER — Ambulatory Visit (INDEPENDENT_AMBULATORY_CARE_PROVIDER_SITE_OTHER): Payer: BLUE CROSS/BLUE SHIELD | Admitting: Podiatry

## 2022-11-08 DIAGNOSIS — Z91199 Patient's noncompliance with other medical treatment and regimen due to unspecified reason: Secondary | ICD-10-CM

## 2022-11-08 NOTE — Progress Notes (Signed)
1. No-show for appointment     

## 2022-11-19 ENCOUNTER — Ambulatory Visit: Payer: BLUE CROSS/BLUE SHIELD

## 2022-11-19 ENCOUNTER — Other Ambulatory Visit: Payer: Self-pay

## 2022-11-19 ENCOUNTER — Ambulatory Visit
Admission: EM | Admit: 2022-11-19 | Discharge: 2022-11-19 | Disposition: A | Discharge status: Home / Self Care | Payer: BLUE CROSS/BLUE SHIELD | Attending: Physician Assistant | Admitting: Physician Assistant

## 2022-11-19 DIAGNOSIS — R519 Headache, unspecified: Secondary | ICD-10-CM | POA: Diagnosis present

## 2022-11-19 DIAGNOSIS — F1290 Cannabis use, unspecified, uncomplicated: Secondary | ICD-10-CM | POA: Diagnosis not present

## 2022-11-19 DIAGNOSIS — I10 Essential (primary) hypertension: Secondary | ICD-10-CM | POA: Diagnosis not present

## 2022-11-19 DIAGNOSIS — R5383 Other fatigue: Secondary | ICD-10-CM | POA: Insufficient documentation

## 2022-11-19 DIAGNOSIS — Z1152 Encounter for screening for COVID-19: Secondary | ICD-10-CM | POA: Diagnosis not present

## 2022-11-19 DIAGNOSIS — F17210 Nicotine dependence, cigarettes, uncomplicated: Secondary | ICD-10-CM | POA: Diagnosis not present

## 2022-11-19 DIAGNOSIS — F32A Depression, unspecified: Secondary | ICD-10-CM | POA: Insufficient documentation

## 2022-11-19 DIAGNOSIS — H9203 Otalgia, bilateral: Secondary | ICD-10-CM | POA: Diagnosis not present

## 2022-11-19 DIAGNOSIS — R062 Wheezing: Secondary | ICD-10-CM

## 2022-11-19 DIAGNOSIS — R112 Nausea with vomiting, unspecified: Secondary | ICD-10-CM | POA: Insufficient documentation

## 2022-11-19 DIAGNOSIS — Z720 Tobacco use: Secondary | ICD-10-CM

## 2022-11-19 DIAGNOSIS — F419 Anxiety disorder, unspecified: Secondary | ICD-10-CM | POA: Diagnosis not present

## 2022-11-19 LAB — SARS CORONAVIRUS 2 BY RT PCR: SARS Coronavirus 2 by RT PCR: NEGATIVE

## 2022-11-19 MED ORDER — KETOROLAC TROMETHAMINE 30 MG/ML IJ SOLN
30.0000 mg | Freq: Once | INTRAMUSCULAR | Status: AC
  Administered 2022-11-19: 30 mg via INTRAMUSCULAR

## 2022-11-19 MED ORDER — KETOROLAC TROMETHAMINE 10 MG PO TABS: 10.0000 mg | ORAL_TABLET | Freq: Four times a day (QID) | ORAL | 0 refills | Status: DC | PRN

## 2022-11-19 NOTE — Discharge Instructions (Addendum)
-  Negative COVID test. - Your x-ray is normal. - We gave you Toradol injection in clinic for headache relief and nausea medicine. - I sent Toradol to pharmacy in case you have any continued headaches tomorrow. - Try to increase your rest and fluids. - If at any point you have worsening headache, feel dizzy, have excessive vomiting, weakness, chest pain, breathing trouble, etc. you should go to the ER.

## 2022-11-19 NOTE — ED Provider Notes (Signed)
[facility]    CSN: [id number] Arrival date & time: 11/19/22  1546      History   Chief Complaint Chief Complaint  Patient presents with   Otalgia   Headache    HPI Sherry Little is a 52 y.o. female with history of anxiety, depression, migraines, hypertension and VSD.  Patient presents for headaches, bilateral ear pain, nasal congestion, fatigue, and nausea that began yesterday.  Denies fever, fatigue, sore throat, cough, chest pain or shortness of breath.  Patient is a smoker but denies any previous diagnosis of COPD.  States she had a negative COVID test at home last week.  Seen by primary care last week for nausea, vomiting and abdominal cramping.  Took Tylenol yesterday without relief. Has not taken any medicine today.  HPI  Past Medical History:  Diagnosis Date   Anxiety    Depression    Heart murmur    Hypertension    Migraine    VSD (ventricular septal defect and aortic arch hypoplasia     Patient Active Problem List   Diagnosis Date Noted   Portal hypertension (HCC)    Other diseases of stomach and duodenum    Secondary esophageal varices without bleeding (HCC)    Loss of weight    Iron deficiency anemia due to chronic blood loss    Gastritis without bleeding    Polyp of sigmoid colon    Abdominal pain, epigastric 03/17/2016   Symptomatic anemia 03/17/2016   Valvular heart disease 03/17/2016   Bradycardia 03/17/2016   Abdominal pain 03/17/2016    Past Surgical History:  Procedure Laterality Date   COLONOSCOPY WITH PROPOFOL N/A 03/19/2016   Procedure: COLONOSCOPY WITH PROPOFOL;  Surgeon: Midge Minium, MD;  Location: [facility]COPY;  Service: Endoscopy;  Laterality: N/A;   ESOPHAGOGASTRODUODENOSCOPY (EGD) WITH PROPOFOL N/A 03/19/2016   Procedure: ESOPHAGOGASTRODUODENOSCOPY (EGD) WITH PROPOFOL;  Surgeon: Midge Minium, MD;  Location: [facility] END[facility]COPY;  Service: Endoscopy;  Laterality: N/A;   GANGLION CYST EXCISION  2008   left wrist   HAND  SURGERY     cyst romved from left thumb   TUBAL LIGATION      OB History   No obstetric history on file.      Home Medications    Prior to Admission medications   Medication Sig Start Date End Date Taking? Authorizing Provider  ketorolac (TORADOL) 10 MG tablet Take 1 tablet (10 mg total) by mouth every 6 (six) hours as needed for moderate pain or severe pain. 11/19/22  Yes Eusebio Friendly B, PA-C  albuterol (VENTOLIN HFA) 108 (90 Base) MCG/ACT inhaler Inhale 2 puffs into the lungs every 4 (four) hours as needed. 04/16/20 05/28/23  [provider]  albuterol (VENTOLIN HFA) 108 (90 Base) MCG/ACT inhaler Inhale 2 puffs into the lungs every 4 (four) hours as needed. 05/27/22   Becky Augusta, NP  benzonatate (TESSALON) 100 MG capsule Take 2 capsules (200 mg total) by mouth every 8 (eight) hours. 05/27/22   Becky Augusta, NP  clonazePAM (KLONOPIN) 0.5 MG tablet Take 0.5 mg by mouth at bedtime as needed. 06/14/20   [provider]  desonide (DESOWEN) 0.05 % cream Apply to affected area 2 times daily 07/17/22 07/17/23  Georga Hacking, MD  dicyclomine (BENTYL) 10 MG capsule Take 1 capsule (10 mg total) by mouth 4 (four) times daily -  before meals and at bedtime for 7 days. 09/04/22 09/11/22  Orvil Feil, PA-C  hydrochlorothiazide (HYDRODIURIL) 25 MG tablet Take 1  tablet by mouth daily. 06/14/20   [provider]  ipratropium (ATROVENT) 0.06 % nasal spray Place 2 sprays into both nostrils 4 (four) times daily. 05/27/22   Becky Augusta, NP  metoCLOPramide (REGLAN) 10 MG tablet Take 1 tablet (10 mg total) by mouth every 8 (eight) hours as needed for up to 5 days for nausea. 09/04/22 09/09/22  Orvil Feil, PA-C  mirtazapine (REMERON) 45 MG tablet TAKE ONE TABLET BY MOUTH ONCE DAILY AT BEDTIME FOR MOOD AND SLEEP. 03/08/21     mirtazapine (REMERON) 45 MG tablet Take 1 tablet (45 mg total) by mouth once nightly at bedtime for mood and sleep. 07/10/21     OLANZapine (ZYPREXA) 2.5 MG tablet  TAKE ONE TABLET (2.5MG  TOTAL) BY MOUTH ONCE DAILY AT BEDTIME. 03/08/21     omeprazole (PRILOSEC) 20 MG capsule Take 1 capsule (20 mg total) by mouth daily. 04/19/22   Becky Augusta, NP  promethazine-dextromethorphan (PROMETHAZINE-DM) 6.25-15 MG/5ML syrup Take 5 mLs by mouth 4 (four) times daily as needed. 05/27/22   Becky Augusta, NP  Spacer/Aero-Holding Chambers (AEROCHAMBER MV) inhaler Use as instructed 05/27/22   Becky Augusta, NP  triamcinolone cream (KENALOG) 0.1 % Apply 1 Application topically 2 (two) times daily. 07/17/22   Georga Hacking, MD  citalopram (CELEXA) 10 MG tablet TAKE ONE TABLET BY MOUTH AT BEDTIME 12/08/19 01/29/21  Zachery Dauer, FNP  fluticasone (FLONASE) 50 MCG/ACT nasal spray Place 2 sprays into both nostrils daily. Patient not taking: No sig reported 12/28/15 07/20/20  Menshew, Charlesetta Ivory, PA-C    Family History Family History  Problem Relation Age of Onset   Lung cancer Mother    Other Father        homicide    Social History Social History   Tobacco Use   Smoking status: Every Day    Current packs/day: 0.75    Types: Cigarettes   Smokeless tobacco: Never  Vaping Use   Vaping status: Never Used  Substance Use Topics   Alcohol use: No   Drug use: Yes    Frequency: 2.0 times per week    Types: Marijuana     Allergies   Bee venom, Mushroom ext cmplx(shiitake-reishi-mait), Other, Mobic [meloxicam], and Tramadol   Review of Systems Review of Systems  Constitutional:  Positive for fatigue. Negative for chills, diaphoresis and fever.  HENT:  Positive for congestion, ear pain and rhinorrhea. Negative for sinus pressure, sinus pain and sore throat.   Respiratory:  Negative for cough and shortness of breath.   Cardiovascular:  Negative for chest pain.  Gastrointestinal:  Positive for nausea and vomiting. Negative for abdominal pain.  Musculoskeletal:  Negative for arthralgias and myalgias.  Skin:  Negative for rash.  Neurological:  Positive for headaches.  Negative for dizziness and weakness.  Hematological:  Negative for adenopathy.     Physical Exam Triage Vital Signs ED Triage Vitals  Encounter Vitals Group     BP 11/19/22 1604 (!) 161/90     Systolic BP Percentile --      Diastolic BP Percentile --      Pulse Rate 11/19/22 1604 60     Resp 11/19/22 1604 17     Temp 11/19/22 1604 98.4 F (36.9 C)     Temp Source 11/19/22 1604 Oral     SpO2 11/19/22 1604 100 %     Weight --      Height --      Head Circumference --  Peak Flow --      Pain Score 11/19/22 1602 10     Pain Loc --      Pain Education --      Exclude from Growth Chart --    No data found.  Updated Vital Signs BP (!) 168/97 (BP Location: Left Arm)   Pulse 60   Temp 98.4 F (36.9 C) (Oral)   Resp 17   LMP 06/30/2020 (Approximate)   SpO2 100%     Physical Exam Vitals and nursing note reviewed.  Constitutional:      General: She is not in acute distress.    Appearance: Normal appearance. She is well-developed. She is ill-appearing. She is not toxic-appearing.     Comments: Patient is lying on exam table with the lights turned off.  HENT:     Head: Normocephalic and atraumatic.     Right Ear: Tympanic membrane, ear canal and external ear normal.     Left Ear: Tympanic membrane, ear canal and external ear normal.     Nose: Congestion present.     Mouth/Throat:     Mouth: Mucous membranes are moist.     Pharynx: Oropharynx is clear.  Eyes:     General: No scleral icterus.       Right eye: No discharge.        Left eye: No discharge.     Extraocular Movements: Extraocular movements intact.     Conjunctiva/sclera: Conjunctivae normal.     Pupils: Pupils are equal, round, and reactive to light.  Cardiovascular:     Rate and Rhythm: Normal rate and regular rhythm.     Heart sounds: Normal heart sounds.  Pulmonary:     Effort: Pulmonary effort is normal. No respiratory distress.     Breath sounds: Rhonchi (diffuse rhonchi throughout) present.   Abdominal:     Palpations: Abdomen is soft.     Tenderness: There is no abdominal tenderness.  Musculoskeletal:     Cervical back: Neck supple.  Skin:    General: Skin is dry.  Neurological:     General: No focal deficit present.     Mental Status: She is alert and oriented to person, place, and time. Mental status is at baseline.     Cranial Nerves: No cranial nerve deficit.     Motor: No weakness.     Coordination: Coordination normal.     Gait: Gait normal.  Psychiatric:        Mood and Affect: Mood normal.        Behavior: Behavior normal.        Thought Content: Thought content normal.      UC Treatments / Results  Labs (all labs ordered are listed, but only abnormal results are displayed) Labs Reviewed  SARS CORONAVIRUS 2 BY RT PCR    EKG   Radiology DG Chest 2 View  Result Date: 11/19/2022 CLINICAL DATA:  Fatigue, wheezing EXAM: CHEST - 2 VIEW COMPARISON:  05/27/2022 FINDINGS: Lungs are clear.  No pleural effusion or pneumothorax. The heart is normal in size. Visualized osseous structures are within normal limits. IMPRESSION: Normal chest radiographs. Electronically Signed   By: Charline Bills M.D.   On: 11/19/2022 17:36    Procedures Procedures (including critical care time)  Medications Ordered in UC Medications  ketorolac (TORADOL) 30 MG/ML injection 30 mg (30 mg Intramuscular Given 11/19/22 1711)    Initial Impression / Assessment and Plan / UC Course  I have reviewed the triage vital signs  and the nursing notes.  Pertinent labs & imaging results that were available during my care of the patient were reviewed by me and considered in my medical decision making (see chart for details).   52 year old female with history of migraines presents for onset of headache, nausea/vomiting, congestion, fatigue yesterday.  Denies fever, cough, sore throat, chest pain, shortness of breath.  Is a smoker but denies diagnosis of COPD.  BP 161/90.  She is afebrile.   Appears ill.  Laying on exam table with lights turned off.  Normal cranial nerve exam.  Throat is clear.  Mild congestion.  No evidence of an ear infection.  Diffuse rhonchi scattered throughout all lung fields.  Ordered COVID test and chest x-ray.   Patient given 30 mg IM ketorolac in clinic for acute headache pain relief.  Negative COVID test and normal chest x-ray.  Patient reporting some improvement in headache after ketorolac injection.  Sent Toradol to pharmacy.  Advised furtive care.  Work note provided.  Reviewed return and ER precautions.   Final Clinical Impressions(s) / UC Diagnoses   Final diagnoses:  Acute nonintractable headache, unspecified headache type  Nausea and vomiting, unspecified vomiting type  Ear pain, bilateral  Tobacco abuse  Wheezing     Discharge Instructions      -Negative COVID test. - Your x-ray is normal. - We gave you Toradol injection in clinic for headache relief and nausea medicine. - I sent Toradol to pharmacy in case you have any continued headaches tomorrow. - Try to increase your rest and fluids. - If at any point you have worsening headache, feel dizzy, have excessive vomiting, weakness, chest pain, breathing trouble, etc. you should go to the ER.     ED Prescriptions     Medication Sig Dispense Auth. Provider   ketorolac (TORADOL) 10 MG tablet Take 1 tablet (10 mg total) by mouth every 6 (six) hours as needed for moderate pain or severe pain. 12 tablet Gareth Morgan      PDMP not reviewed this encounter.   Shirlee Latch, PA-C 11/19/22 1810

## 2022-11-19 NOTE — ED Triage Notes (Signed)
Pt is here with bilateral ear pain and a headache that started weeks ago, states after ignoring it so long she wanted to been seen since the pain started back up severely last night. Pt has not taken any meds to relieve discomfort.

## 2022-12-27 ENCOUNTER — Encounter: Payer: Self-pay | Admitting: Podiatry

## 2022-12-27 ENCOUNTER — Ambulatory Visit: Payer: BLUE CROSS/BLUE SHIELD | Admitting: Podiatry

## 2022-12-27 DIAGNOSIS — M79675 Pain in left toe(s): Secondary | ICD-10-CM

## 2022-12-27 DIAGNOSIS — M79674 Pain in right toe(s): Secondary | ICD-10-CM | POA: Diagnosis not present

## 2022-12-27 DIAGNOSIS — B351 Tinea unguium: Secondary | ICD-10-CM | POA: Diagnosis not present

## 2022-12-27 NOTE — Progress Notes (Unsigned)
Subjective:  Patient ID: Sherry Little, female    DOB: 1970/12/21,  MRN: [id number]  52 y.o. female presents {jgcomplaint:23593}  Chief Complaint  Patient presents with   Nail Problem    New problem(s): None   [facility] is [facility] , and last visit was {Time; dates multiple:15870}.  Allergies  Allergen Reactions   Bee Venom Swelling   Mushroom Ext Cmplx(Shiitake-Reishi-Mait) Itching   Other Itching and Swelling    All mushrooms   Mobic [Meloxicam] Rash   Tramadol Rash    Review of Systems: Negative except as noted in the HPI.   Objective:  Sherry Little is a pleasant 52 y.o. female WD, WN in NAD.Marland Kitchen AAO x 3.  Vascular Examination: Vascular status intact b/l with palpable pedal pulses. CFT immediate b/l. Pedal hair present. No edema. No pain with calf compression b/l. Skin temperature gradient WNL b/l. No varicosities noted. No cyanosis or clubbing noted.  Neurological Examination: Sensation grossly intact b/l with 10 gram monofilament. Vibratory sensation intact b/l.  Dermatological Examination: Pedal skin with normal turgor, texture and tone b/l. No open wounds nor interdigital macerations noted. Toenails 1-5 b/l thick, discolored, elongated with subungual debris and pain on dorsal palpation. No hyperkeratotic lesions noted b/l.   Musculoskeletal Examination: Muscle strength 5/5 to b/l LE.  No pain, crepitus noted b/l. No gross pedal deformities. Patient ambulates independently without assistive aids.   Radiographs: None  Assessment:  No diagnosis found.  Plan:  {jgplan:23602::"-Patient/POA to call should there be question/concern in the interim."}  Return in about 3 months (around 03/28/2023).  Sherry Little, DPM

## 2023-03-28 ENCOUNTER — Ambulatory Visit (INDEPENDENT_AMBULATORY_CARE_PROVIDER_SITE_OTHER): Payer: BLUE CROSS/BLUE SHIELD | Admitting: Podiatry

## 2023-03-28 DIAGNOSIS — Z91199 Patient's noncompliance with other medical treatment and regimen due to unspecified reason: Secondary | ICD-10-CM

## 2023-03-28 NOTE — Progress Notes (Signed)
1. No-show for appointment    Patient checked in and left without being seen.

## 2023-04-11 ENCOUNTER — Ambulatory Visit (INDEPENDENT_AMBULATORY_CARE_PROVIDER_SITE_OTHER): Payer: BLUE CROSS/BLUE SHIELD | Admitting: Podiatry

## 2023-04-11 DIAGNOSIS — Z91199 Patient's noncompliance with other medical treatment and regimen due to unspecified reason: Secondary | ICD-10-CM

## 2023-04-11 NOTE — Progress Notes (Signed)
 1. No-show for appointment

## 2023-05-21 ENCOUNTER — Ambulatory Visit: Payer: Self-pay

## 2023-06-07 ENCOUNTER — Other Ambulatory Visit: Payer: Self-pay

## 2023-06-07 ENCOUNTER — Emergency Department

## 2023-06-07 ENCOUNTER — Emergency Department
Admission: EM | Admit: 2023-06-07 | Discharge: 2023-06-07 | Discharge status: Against Medical Advice | Attending: Emergency Medicine | Admitting: Emergency Medicine

## 2023-06-07 DIAGNOSIS — Y9301 Activity, walking, marching and hiking: Secondary | ICD-10-CM | POA: Diagnosis not present

## 2023-06-07 DIAGNOSIS — M25571 Pain in right ankle and joints of right foot: Secondary | ICD-10-CM | POA: Insufficient documentation

## 2023-06-07 DIAGNOSIS — Y92009 Unspecified place in unspecified non-institutional (private) residence as the place of occurrence of the external cause: Secondary | ICD-10-CM | POA: Insufficient documentation

## 2023-06-07 DIAGNOSIS — X501XXA Overexertion from prolonged static or awkward postures, initial encounter: Secondary | ICD-10-CM | POA: Insufficient documentation

## 2023-06-07 DIAGNOSIS — Z5321 Procedure and treatment not carried out due to patient leaving prior to being seen by health care provider: Secondary | ICD-10-CM | POA: Insufficient documentation

## 2023-06-07 NOTE — ED Triage Notes (Signed)
 Pt stating it happened Tuesday but pain started yesterday, shooting up into calf.

## 2023-06-07 NOTE — ED Triage Notes (Signed)
 Pt to ED via POV for right ankle pain, pt states her ankle twisted, pt was walking back into house and stepped wrong.

## 2023-06-08 NOTE — Progress Notes (Deleted)
 Psychiatric Initial Adult Assessment   Patient Identification: Sherry Little MRN:  [id number] Date of Evaluation:  06/08/2023 Referral Source: Ardyth Gal MD Chief Complaint:  No chief complaint on file.  Visit Diagnosis: No diagnosis found.  History of Present Illness:  Sherry Little is a 53 year old African-American female, lives in [place], employed, has a history of depression, VSD, heart murmur, iron deficiency anemia presented to establish care.   Associated Signs/Symptoms: Depression Symptoms:  {DEPRESSION SYMPTOMS:20000} (Hypo) Manic Symptoms:  {BHH MANIC SYMPTOMS:22872} Anxiety Symptoms:  {BHH ANXIETY SYMPTOMS:22873} Psychotic Symptoms:  {BHH PSYCHOTIC SYMPTOMS:22874} PTSD Symptoms: {BHH PTSD SYMPTOMS:22875}  Past Psychiatric History: ***  Previous Psychotropic Medications: {YES/NO:21197}  Substance Abuse History in the last 12 months:  {yes no:314532}  Consequences of Substance Abuse: {BHH CONSEQUENCES OF SUBSTANCE ABUSE:22880}  Past Medical History:  Past Medical History:  Diagnosis Date   Anxiety    Depression    Heart murmur    Hypertension    Migraine    VSD (ventricular septal defect and aortic arch hypoplasia     Past Surgical History:  Procedure Laterality Date   COLONOSCOPY WITH PROPOFOL N/A 03/19/2016   Procedure: COLONOSCOPY WITH PROPOFOL;  Surgeon: Midge Minium, MD;  Location: [facility] ENDOSCOPY;  Service: Endoscopy;  Laterality: N/A;   ESOPHAGOGASTRODUODENOSCOPY (EGD) WITH PROPOFOL N/A 03/19/2016   Procedure: ESOPHAGOGASTRODUODENOSCOPY (EGD) WITH PROPOFOL;  Surgeon: Midge Minium, MD;  Location: [facility] END[facility]COPY;  Service: Endoscopy;  Laterality: N/A;   GANGLION CYST EXCISION  2008   left wrist   HAND SURGERY     cyst romved from left thumb   TUBAL LIGATION      Family Psychiatric History: ***  Family History:  Family History  Problem Relation Age of Onset   Lung cancer Mother    Other Father        homicide    Social History:   Social  History   Socioeconomic History   Marital status: Single    Spouse name: Not on file   Number of children: Not on file   Years of education: Not on file   Highest education level: Not on file  Occupational History   Not on file  Tobacco Use   Smoking status: Every Day    Current packs/day: 0.75    Types: Cigarettes   Smokeless tobacco: Never  Vaping Use   Vaping status: Never Used  Substance and Sexual Activity   Alcohol use: No   Drug use: Yes    Frequency: 2.0 times per week    Types: Marijuana   Sexual activity: Not on file  Other Topics Concern   Not on file  Social History Narrative   ** Merged History Encounter **      - was approved for foodstamps ($15) but that is not ehough help for her    - Uses the [place] system when she has the money. Asked specifically for help with a bus pass or help with transport    - Living on her own             Would like assistance with food and transport needs.   Pt signed informed consent for info sharing for possible aid in these areas. 7/10   Social Drivers of Corporate investment banker Strain: Low Risk  (08/23/2022)   [facility]   Overall Financial Resource Strain (CARDIA)    Difficulty of Paying Living Expenses: Not hard at all  Food Insecurity: Food Insecurity Present (  11/22/2022)   Received from [facility]   Hunger Vital Sign    Worried About Running Out of Food in the Last Year: Sometimes true    Ran Out of Food in the Last Year: Sometimes true  Transportation Needs: Unmet Transportation Needs (11/22/2022)   Received from [facility]    Lack of Transportation (Medical): Yes    Lack of Transportation (Non-Medical): Yes  Physical Activity: Sufficiently Active (10/15/2017)   Exercise Vital Sign    Days of Exercise per Week: 3 days    Minutes of Exercise per Session: 60 min  Stress: Stress Concern Present (10/15/2017)   [employer] of Occupational  Health - Occupational Stress Questionnaire    Feeling of Stress : To some extent  Social Connections: Somewhat Isolated (10/15/2017)   Social Connection and Isolation Panel [NHANES]    Frequency of Communication with Friends and Family: Three times a week    Frequency of Social Gatherings with Friends and Family: Three times a week    Attends Religious Services: More than 4 times per year    Active Member of Clubs or Organizations: No    Attends Banker Meetings: Never    Marital Status: Never married    Additional Social History: ***  Allergies:   Allergies  Allergen Reactions   Bee Venom Swelling   Mushroom Ext Cmplx(Shiitake-Reishi-Mait) Itching   Other Itching and Swelling    All mushrooms   Mobic [Meloxicam] Rash   Tramadol Rash    Metabolic Disorder Labs: No results found for: "HGBA1C", "MPG" No results found for: "PROLACTIN" Lab Results  Component Value Date   CHOL 227 (H) 10/15/2017   TRIG 73 10/15/2017   HDL 77 10/15/2017   CHOLHDL 2.9 10/15/2017   LDLCALC 135 (H) 10/15/2017   Lab Results  Component Value Date   TSH 0.836 10/15/2017    Therapeutic Level Labs: No results found for: "LITHIUM" No results found for: "CBMZ" No results found for: "VALPROATE"  Current Medications: Current Outpatient Medications  Medication Sig Dispense Refill   albuterol (VENTOLIN HFA) 108 (90 Base) MCG/ACT inhaler Inhale 2 puffs into the lungs every 4 (four) hours as needed.     albuterol (VENTOLIN HFA) 108 (90 Base) MCG/ACT inhaler Inhale 2 puffs into the lungs every 4 (four) hours as needed. 18 g 0   benzonatate (TESSALON) 100 MG capsule Take 2 capsules (200 mg total) by mouth every 8 (eight) hours. 21 capsule 0   clonazePAM (KLONOPIN) 0.5 MG tablet Take 0.5 mg by mouth at bedtime as needed.     desonide (DESOWEN) 0.05 % cream Apply to affected area 2 times daily 60 g 1   dicyclomine (BENTYL) 10 MG capsule Take 1 capsule (10 mg total) by mouth 4 (four) times  daily -  before meals and at bedtime for 7 days. 21 capsule 0   hydrochlorothiazide (HYDRODIURIL) 25 MG tablet Take 1 tablet by mouth daily.     ipratropium (ATROVENT) 0.06 % nasal spray Place 2 sprays into both nostrils 4 (four) times daily. 15 mL 12   ketorolac (TORADOL) 10 MG tablet Take 1 tablet (10 mg total) by mouth every 6 (six) hours as needed for moderate pain or severe pain. 12 tablet 0   metoCLOPramide (REGLAN) 10 MG tablet Take 1 tablet (10 mg total) by mouth every 8 (eight) hours as needed for up to 5 days for nausea. 15 tablet 1   mirtazapine (REMERON) 45 MG tablet  TAKE ONE TABLET BY MOUTH ONCE DAILY AT BEDTIME FOR MOOD AND SLEEP. 30 tablet 5   mirtazapine (REMERON) 45 MG tablet Take 1 tablet (45 mg total) by mouth once nightly at bedtime for mood and sleep. 30 tablet 5   OLANZapine (ZYPREXA) 2.5 MG tablet TAKE ONE TABLET (2.5MG  TOTAL) BY MOUTH ONCE DAILY AT BEDTIME. 30 tablet 2   omeprazole (PRILOSEC) 20 MG capsule Take 1 capsule (20 mg total) by mouth daily. 30 capsule 0   promethazine-dextromethorphan (PROMETHAZINE-DM) 6.25-15 MG/5ML syrup Take 5 mLs by mouth 4 (four) times daily as needed. 118 mL 0   Spacer/Aero-Holding Chambers (AEROCHAMBER MV) inhaler Use as instructed 1 each 2   triamcinolone cream (KENALOG) 0.1 % Apply 1 Application topically 2 (two) times daily. 30 g 0   No current facility-administered medications for this visit.    Musculoskeletal: Strength & Muscle Tone: {desc; muscle tone:32375} Gait & Station: {PE GAIT ED UEAV:40981} Patient leans: {Patient Leans:21022755}  Psychiatric Specialty Exam: Review of Systems  Last menstrual period 06/30/2020.There is no height or weight on file to calculate BMI.  General Appearance: {Appearance:22683}  Eye Contact:  {BHH EYE CONTACT:22684}  Speech:  {Speech:22685}  Volume:  {Volume (PAA):22686}  Mood:  {BHH MOOD:22306}  Affect:  {Affect (PAA):22687}  Thought Process:  {Thought Process (PAA):22688}  Orientation:   {BHH ORIENTATION (PAA):22689}  Thought Content:  {Thought Content:22690}  Suicidal Thoughts:  {ST/HT (PAA):22692}  Homicidal Thoughts:  {ST/HT (PAA):22692}  Memory:  {BHH MEMORY:22881}  Judgement:  {Judgement (PAA):22694}  Insight:  {Insight (PAA):22695}  Psychomotor Activity:  {Psychomotor (PAA):22696}  Concentration:  {Concentration:21399}  Recall:  {BHH GOOD/FAIR/POOR:22877}  Fund of Knowledge:{BHH GOOD/FAIR/POOR:22877}  Language: {BHH GOOD/FAIR/POOR:22877}  Akathisia:  {BHH YES OR NO:22294}  Handed:  {Handed:22697}  AIMS (if indicated):  {Desc; done/not:10129}  Assets:  {Assets (PAA):22698}  ADL's:  {BHH XBJ'Y:78295}  Cognition: {chl bhh cognition:304700322}  Sleep:  {BHH GOOD/FAIR/POOR:22877}   Screenings: Flowsheet Row ED from 06/07/2023 in [facility] Emergency Department at [facility] ED from 11/19/2022 in [facility] at [facility]  ED from 09/04/2022 in [facility] Emergency Department at [facility]  C-SSRS RISK CATEGORY No Risk No Risk No Risk       Assessment and Plan: NICLE CONNOLE is a 53 year old African-American female, lives in [place], has a history of multiple medical problems including depression presented to establish care.  Discussed assessment and plan as noted below.     Collaboration of Care: {BH OP Collaboration of Care:21014065}  Patient/Guardian was advised Release of Information must be obtained prior to any record release in order to collaborate their care with an outside provider. Patient/Guardian was advised if they have not already done so to contact the registration department to sign all necessary forms in order for [place] to release information regarding their care.   Consent: Patient/Guardian gives verbal consent for treatment and assignment of benefits for services provided during this visit. Patient/Guardian expressed understanding and agreed to proceed.   Jomarie Longs, MD 3/2/20257:27 AM

## 2023-06-09 ENCOUNTER — Ambulatory Visit: Payer: BLUE CROSS/BLUE SHIELD | Admitting: Psychiatry

## 2023-06-09 ENCOUNTER — Other Ambulatory Visit: Payer: Self-pay

## 2023-06-09 ENCOUNTER — Ambulatory Visit: Admission: EM | Admit: 2023-06-09 | Discharge: 2023-06-09 | Disposition: A | Discharge status: Home / Self Care

## 2023-06-09 VITALS — BP 154/86 | HR 56 | Temp 98.6°F | Resp 18

## 2023-06-09 DIAGNOSIS — S93401A Sprain of unspecified ligament of right ankle, initial encounter: Secondary | ICD-10-CM

## 2023-06-09 MED ORDER — IBUPROFEN 600 MG PO TABS: 600.0000 mg | ORAL_TABLET | Freq: Four times a day (QID) | ORAL | 0 refills | Status: DC | PRN

## 2023-06-09 NOTE — ED Triage Notes (Signed)
 Patient presents with c/o right ankle pain. Patient states she twisted her ankle 6 days. Patient states she went to [facility] and had x-rays but left before being seen.

## 2023-06-09 NOTE — ED Provider Notes (Signed)
 [facility]    CSN: [id number] Arrival date & time: 06/09/23  1013      History   Chief Complaint Chief Complaint  Patient presents with   Ankle Pain    HPI Sherry Little is a 53 y.o. female.   HPI  53 year old female with past medical history significant for migraine headaches, hypertension, heart murmur, VSD and aortic arch hypoplasia, depression, and anxiety presents for evaluation of 6 days with a right ankle pain.  She reports that she twisted her ankle 6 days ago.  She went to the ER at [facility] 2 days ago where she had an x-ray but left before seeing a provider and received the results.  She has been using a friend's crutches to get around.  Past Medical History:  Diagnosis Date   Anxiety    Depression    Heart murmur    Hypertension    Migraine    VSD (ventricular septal defect and aortic arch hypoplasia     Patient Active Problem List   Diagnosis Date Noted   Portal hypertension (HCC)    Other diseases of stomach and duodenum    Secondary esophageal varices without bleeding (HCC)    Loss of weight    Iron deficiency anemia due to chronic blood loss    Gastritis without bleeding    Polyp of sigmoid colon    Abdominal pain, epigastric 03/17/2016   Symptomatic anemia 03/17/2016   Valvular heart disease 03/17/2016   Bradycardia 03/17/2016   Abdominal pain 03/17/2016    Past Surgical History:  Procedure Laterality Date   COLONOSCOPY WITH PROPOFOL N/A 03/19/2016   Procedure: COLONOSCOPY WITH PROPOFOL;  Surgeon: Midge Minium, MD;  Location: [facility] ENDOSCOPY;  Service: Endoscopy;  Laterality: N/A;   ESOPHAGOGASTRODUODENOSCOPY (EGD) WITH PROPOFOL N/A 03/19/2016   Procedure: ESOPHAGOGASTRODUODENOSCOPY (EGD) WITH PROPOFOL;  Surgeon: Midge Minium, MD;  Location: [facility] ENDOSCOPY;  Service: Endoscopy;  Laterality: N/A;   GANGLION CYST EXCISION  2008   left wrist   HAND SURGERY     cyst romved from left thumb   TUBAL LIGATION      OB History    No obstetric history on file.      Home Medications    Prior to Admission medications   Medication Sig Start Date End Date Taking? Authorizing Provider  EPINEPHrine 0.3 mg/0.3 mL IJ SOAJ injection Inject into the muscle. 08/23/22  Yes [provider]  ibuprofen (ADVIL) 600 MG tablet Take 1 tablet (600 mg total) by mouth every 6 (six) hours as needed. 06/09/23  Yes Becky Augusta, NP  nicotine (NICODERM CQ - DOSED IN MG/24 HOURS) 21 mg/24hr patch 21 mg daily. 01/21/23  Yes [provider]  albuterol (VENTOLIN HFA) 108 (90 Base) MCG/ACT inhaler Inhale 2 puffs into the lungs every 4 (four) hours as needed. 04/16/20 05/28/23  [provider]  albuterol (VENTOLIN HFA) 108 (90 Base) MCG/ACT inhaler Inhale 2 puffs into the lungs every 4 (four) hours as needed. 05/27/22   Becky Augusta, NP  atorvastatin (LIPITOR) 20 MG tablet Take 20 mg by mouth daily.    [provider]  desonide (DESOWEN) 0.05 % cream Apply to affected area 2 times daily 07/17/22 07/17/23  Georga Hacking, MD  gabapentin (NEURONTIN) 300 MG capsule Take 300 mg by mouth 3 (three) times daily.    [provider]  hydrochlorothiazide (HYDRODIURIL) 25 MG tablet Take 1 tablet by mouth daily. 06/14/20   [provider]  ipratropium (ATROVENT) 0.06 %  nasal spray Place 2 sprays into both nostrils 4 (four) times daily. 05/27/22   Becky Augusta, NP  mirtazapine (REMERON) 45 MG tablet TAKE ONE TABLET BY MOUTH ONCE DAILY AT BEDTIME FOR MOOD AND SLEEP. 03/08/21     mirtazapine (REMERON) 45 MG tablet Take 1 tablet (45 mg total) by mouth once nightly at bedtime for mood and sleep. 07/10/21     OLANZapine (ZYPREXA) 2.5 MG tablet TAKE ONE TABLET (2.5MG  TOTAL) BY MOUTH ONCE DAILY AT BEDTIME. 03/08/21     Spacer/Aero-Holding Chambers (AEROCHAMBER MV) inhaler Use as instructed 05/27/22   Becky Augusta, NP  citalopram (CELEXA) 10 MG tablet TAKE ONE TABLET BY MOUTH AT BEDTIME 12/08/19 01/29/21  Zachery Dauer, FNP   fluticasone (FLONASE) 50 MCG/ACT nasal spray Place 2 sprays into both nostrils daily. Patient not taking: No sig reported 12/28/15 07/20/20  Menshew, Charlesetta Ivory, PA-C    Family History Family History  Problem Relation Age of Onset   Lung cancer Mother    Other Father        homicide    Social History Social History   Tobacco Use   Smoking status: Every Day    Current packs/day: 0.75    Types: Cigarettes   Smokeless tobacco: Never  Vaping Use   Vaping status: Never Used  Substance Use Topics   Alcohol use: No   Drug use: Yes    Frequency: 2.0 times per week    Types: Marijuana     Allergies   Bee venom, Mushroom ext cmplx(shiitake-reishi-mait), Other, Mobic [meloxicam], and Tramadol   Review of Systems Review of Systems  Musculoskeletal:  Positive for arthralgias. Negative for joint swelling.  Neurological:  Positive for numbness. Negative for weakness.     Physical Exam Triage Vital Signs ED Triage Vitals [06/09/23 1029]  Encounter Vitals Group     BP (!) 154/86     Systolic BP Percentile      Diastolic BP Percentile      Pulse Rate (!) 56     Resp 18     Temp 98.6 F (37 C)     Temp Source Oral     SpO2 100 %     Weight      Height      Head Circumference      Peak Flow      Pain Score 10     Pain Loc      Pain Education      Exclude from Growth Chart    No data found.  Updated Vital Signs BP (!) 154/86 (BP Location: Left Arm)   Pulse (!) 56   Temp 98.6 F (37 C) (Oral)   Resp 18   LMP 06/30/2020 (Approximate)   SpO2 100%   Visual Acuity Right Eye Distance:   Left Eye Distance:   Bilateral Distance:    Right Eye Near:   Left Eye Near:    Bilateral Near:     Physical Exam Vitals and nursing note reviewed.  Constitutional:      Appearance: Normal appearance. She is not ill-appearing.  HENT:     Head: Normocephalic and atraumatic.  Musculoskeletal:        General: Tenderness and signs of injury present. No swelling or  deformity.  Skin:    General: Skin is warm and dry.     Capillary Refill: Capillary refill takes less than 2 seconds.     Findings: No bruising or erythema.  Neurological:     General:  No focal deficit present.     Mental Status: She is alert and oriented to person, place, and time.      UC Treatments / Results  Labs (all labs ordered are listed, but only abnormal results are displayed) Labs Reviewed - No data to display  EKG   Radiology DG Ankle Complete Right Result Date: 06/07/2023 CLINICAL DATA:  Right ankle pain twisting injury EXAM: RIGHT ANKLE - COMPLETE 3+ VIEW COMPARISON:  Right foot x-ray 09/12/2021 FINDINGS: Corticated osseous density adjacent to the tip of the medial malleolus suggesting ossicle or remote injury. No definite acute fracture or malalignment. Ankle mortise is symmetric. Soft tissues are unremarkable IMPRESSION: Findings suggest ossicle or remote injury at the medial malleolus. No definite acute osseous abnormality. Electronically Signed   By: Jasmine Pang M.D.   On: 06/07/2023 15:23    Procedures Procedures (including critical care time)  Medications Ordered in UC Medications - No data to display  Initial Impression / Assessment and Plan / UC Course  I have reviewed the triage vital signs and the nursing notes.  Pertinent labs & imaging results that were available during my care of the patient were reviewed by me and considered in my medical decision making (see chart for details).   Patient is a nontoxic-appearing 85 old female pending for evaluation of right ankle pain as outlined HPI above.  The patient is reporting the pain is inferior to her lateral malleolus as well as along the lateral edge of her foot.  She states that last night she started to develop some numbness along the lateral edge of her foot.  Her foot and ankle are normal anatomical alignment and free of edema or ecchymosis.  DP and PT pulses are 2+.  She is tender with palpation of her  lateral ankle and lateral edge of her foot.  She has limited range of motion secondary to pain.  Review of the x-ray results from [facility] dated 06/07/2023 show corticated osseous density adjacent to the tip of the medial malleolus suggesting ossicle or remote injury.  No definite acute fracture or malalignment.  Ankle mortise is symmetric and soft tissues are unremarkable.  I will discharge patient home with a diagnosis of right ankle sprain and will have staff outfit her with her own crutches, as she is using her friends.  I will also have them place her in an Aircast.  Additionally, I will give her home physical therapy exercises to perform and refer her to podiatry for follow-up should her symptoms continue.  I will also prescribe 6 and milligrams ibuprofen that she can take every 6 hours as needed for pain and inflammation along with ice and elevation.  ER precautions reviewed.  Work note provided.   Final Clinical Impressions(s) / UC Diagnoses   Final diagnoses:  Sprain of right ankle, unspecified ligament, initial encounter     Discharge Instructions      Keep your ankle elevated is much as possible to help decrease swelling and aid in healing.  Apply moist heat to your ankle for 20 minutes at a time to help improve blood flow which will bring fresh oxygen and nutrients to the ligaments and help facilitate the removal of metabolic byproducts from inflammation.  Take over-the-counter ibuprofen, 600 mg (3 tablets) every 6 hours with food to help with inflammation and pain.  Wear the air cast ankle brace when up and moving.  You may take it off at nighttime, when bathing, and when not walking on her  ankle.  Follow the rehabilitation exercises given your discharge instructions.  Wait to start the phase 1 exercises until 48 hours after your injury to give time for the inflammation to go down.  Progress to phase 2 after you can complete phase 1 with out any significant pain.   I have referred you  to podiatry and if your symptoms are not improving, or they worsen, you need to follow-up with podiatry for further evaluation.  If you have any sharp increase in pain, or dramatic increase in swelling you need to go to the ER for evaluation.     ED Prescriptions     Medication Sig Dispense Auth. Provider   ibuprofen (ADVIL) 600 MG tablet Take 1 tablet (600 mg total) by mouth every 6 (six) hours as needed. 30 tablet Becky Augusta, NP      PDMP not reviewed this encounter.   Becky Augusta, NP 06/09/23 1049

## 2023-06-09 NOTE — Discharge Instructions (Addendum)
 Keep your ankle elevated is much as possible to help decrease swelling and aid in healing.  Apply moist heat to your ankle for 20 minutes at a time to help improve blood flow which will bring fresh oxygen and nutrients to the ligaments and help facilitate the removal of metabolic byproducts from inflammation.  Take over-the-counter ibuprofen, 600 mg (3 tablets) every 6 hours with food to help with inflammation and pain.  Wear the air cast ankle brace when up and moving.  You may take it off at nighttime, when bathing, and when not walking on her ankle.  Follow the rehabilitation exercises given your discharge instructions.  Wait to start the phase 1 exercises until 48 hours after your injury to give time for the inflammation to go down.  Progress to phase 2 after you can complete phase 1 with out any significant pain.   I have referred you to podiatry and if your symptoms are not improving, or they worsen, you need to follow-up with podiatry for further evaluation.  If you have any sharp increase in pain, or dramatic increase in swelling you need to go to the ER for evaluation.

## 2023-06-16 ENCOUNTER — Ambulatory Visit
Admission: EM | Admit: 2023-06-16 | Discharge: 2023-06-16 | Disposition: A | Discharge status: Home / Self Care | Attending: Physician Assistant | Admitting: Physician Assistant

## 2023-06-16 ENCOUNTER — Encounter: Payer: Self-pay | Admitting: Emergency Medicine

## 2023-06-16 DIAGNOSIS — S93401D Sprain of unspecified ligament of right ankle, subsequent encounter: Secondary | ICD-10-CM

## 2023-06-16 DIAGNOSIS — M25571 Pain in right ankle and joints of right foot: Secondary | ICD-10-CM

## 2023-06-16 MED ORDER — IBUPROFEN 600 MG PO TABS: 600.0000 mg | ORAL_TABLET | Freq: Four times a day (QID) | ORAL | 0 refills | Status: AC | PRN

## 2023-06-16 NOTE — Discharge Instructions (Addendum)
 Call the ortho office below. A referral was placed at your last visit. Where: [facility] Address: [address] [place] [address] Phone: [phone] Expires: 06/08/2024 (requested) Right ankle sprain. Significant pain  If you cannot get in at the office above, follow up with one of these office.   [facility] Address: [address], [place], [place] [address] Phone: [phone]  [facility] [address] [place], [place], [place] [address] Phone: [phone]  [facility] [address] [place], [place] [address] Phone: [phone]   SPRAIN: Stressed avoiding painful activities . Reviewed RICE guidelines. Use medications as directed, including NSAIDs. If no NSAIDs have been prescribed for you today, you may take Aleve or Motrin over the counter. May use Tylenol in between doses of NSAIDs.  I

## 2023-06-16 NOTE — ED Triage Notes (Signed)
 Pt was seen 3/3 for right ankle pain and she is not better.

## 2023-06-16 NOTE — ED Provider Notes (Signed)
 [facility]    CSN: [id number] Arrival date & time: 06/16/23  1114      History   Chief Complaint Chief Complaint  Patient presents with   Ankle Pain    HPI Sherry Little is a 53 y.o. female presenting for continued right ankle pain. Patient says she twisted her ankle 2 week ago. She had imaging performed 9 days ago and it was negative.  Seen at this urgent care 1 week ago.  X-rays were reviewed by provider.  Patient was diagnosed with ankle sprain and given an Aircast.  She was advised to take anti-inflammatory medication and perform physical therapy exercises at home.  A referral was placed to podiatry but she has not received a call.  She feels that her pain is getting worse.  Reports difficulty with weightbearing.  Has not been working.  No other complaints.  HPI  Past Medical History:  Diagnosis Date   Anxiety    Depression    Heart murmur    Hypertension    Migraine    VSD (ventricular septal defect and aortic arch hypoplasia     Patient Active Problem List   Diagnosis Date Noted   Portal hypertension (HCC)    Other diseases of stomach and duodenum    Secondary esophageal varices without bleeding (HCC)    Loss of weight    Iron deficiency anemia due to chronic blood loss    Gastritis without bleeding    Polyp of sigmoid colon    Abdominal pain, epigastric 03/17/2016   Symptomatic anemia 03/17/2016   Valvular heart disease 03/17/2016   Bradycardia 03/17/2016   Abdominal pain 03/17/2016    Past Surgical History:  Procedure Laterality Date   COLONOSCOPY WITH PROPOFOL N/A 03/19/2016   Procedure: COLONOSCOPY WITH PROPOFOL;  Surgeon: Midge Minium, MD;  Location: [facility]COPY;  Service: Endoscopy;  Laterality: N/A;   ESOPHAGOGASTRODUODENOSCOPY (EGD) WITH PROPOFOL N/A 03/19/2016   Procedure: ESOPHAGOGASTRODUODENOSCOPY (EGD) WITH PROPOFOL;  Surgeon: Midge Minium, MD;  Location: [facility];  Service: Endoscopy;  Laterality: N/A;   GANGLION CYST  EXCISION  2008   left wrist   HAND SURGERY     cyst romved from left thumb   TUBAL LIGATION      OB History   No obstetric history on file.      Home Medications    Prior to Admission medications   Medication Sig Start Date End Date Taking? Authorizing Provider  albuterol (VENTOLIN HFA) 108 (90 Base) MCG/ACT inhaler Inhale 2 puffs into the lungs every 4 (four) hours as needed. 04/16/20 05/28/23  [provider]  albuterol (VENTOLIN HFA) 108 (90 Base) MCG/ACT inhaler Inhale 2 puffs into the lungs every 4 (four) hours as needed. 05/27/22   Becky Augusta, NP  atorvastatin (LIPITOR) 20 MG tablet Take 20 mg by mouth daily.    [provider]  desonide (DESOWEN) 0.05 % cream Apply to affected area 2 times daily 07/17/22 07/17/23  Georga Hacking, MD  EPINEPHrine 0.3 mg/0.3 mL IJ SOAJ injection Inject into the muscle. 08/23/22   [provider]  gabapentin (NEURONTIN) 300 MG capsule Take 300 mg by mouth 3 (three) times daily.    [provider]  hydrochlorothiazide (HYDRODIURIL) 25 MG tablet Take 1 tablet by mouth daily. 06/14/20   [provider]  ibuprofen (ADVIL) 600 MG tablet Take 1 tablet (600 mg total) by mouth every 6 (six) hours as needed for moderate pain (pain score 4-6). 06/16/23   Eusebio Friendly  B, PA-C  ipratropium (ATROVENT) 0.06 % nasal spray Place 2 sprays into both nostrils 4 (four) times daily. 05/27/22   Becky Augusta, NP  mirtazapine (REMERON) 45 MG tablet TAKE ONE TABLET BY MOUTH ONCE DAILY AT BEDTIME FOR MOOD AND SLEEP. 03/08/21     mirtazapine (REMERON) 45 MG tablet Take 1 tablet (45 mg total) by mouth once nightly at bedtime for mood and sleep. 07/10/21     nicotine (NICODERM CQ - DOSED IN MG/24 HOURS) 21 mg/24hr patch 21 mg daily. 01/21/23   [provider]  OLANZapine (ZYPREXA) 2.5 MG tablet TAKE ONE TABLET (2.5MG  TOTAL) BY MOUTH ONCE DAILY AT BEDTIME. 03/08/21     Spacer/Aero-Holding Chambers (AEROCHAMBER MV) inhaler Use as  instructed 05/27/22   Becky Augusta, NP  citalopram (CELEXA) 10 MG tablet TAKE ONE TABLET BY MOUTH AT BEDTIME 12/08/19 01/29/21  Zachery Dauer, FNP  fluticasone (FLONASE) 50 MCG/ACT nasal spray Place 2 sprays into both nostrils daily. Patient not taking: No sig reported 12/28/15 07/20/20  Menshew, Charlesetta Ivory, PA-C    Family History Family History  Problem Relation Age of Onset   Lung cancer Mother    Other Father        homicide    Social History Social History   Tobacco Use   Smoking status: Every Day    Current packs/day: 0.75    Types: Cigarettes   Smokeless tobacco: Never  Vaping Use   Vaping status: Never Used  Substance Use Topics   Alcohol use: No   Drug use: Yes    Frequency: 2.0 times per week    Types: Marijuana     Allergies   Bee venom, Mushroom ext cmplx(shiitake-reishi-mait), Other, Mobic [meloxicam], and Tramadol   Review of Systems Review of Systems  Musculoskeletal:  Positive for arthralgias, gait problem and joint swelling.  Neurological:  Negative for weakness and numbness.     Physical Exam Triage Vital Signs ED Triage Vitals  Encounter Vitals Group     BP 06/16/23 1203 (!) 145/83     Systolic BP Percentile --      Diastolic BP Percentile --      Pulse Rate 06/16/23 1203 (!) 50     Resp 06/16/23 1203 18     Temp 06/16/23 1203 98.3 F (36.8 C)     Temp Source 06/16/23 1203 Oral     SpO2 06/16/23 1203 98 %     Weight --      Height --      Head Circumference --      Peak Flow --      Pain Score 06/16/23 1202 8     Pain Loc --      Pain Education --      Exclude from Growth Chart --    No data found.  Updated Vital Signs BP (!) 145/83 (BP Location: Right Arm)   Pulse (!) 50   Temp 98.3 F (36.8 C) (Oral)   Resp 18   LMP 06/30/2020 (Approximate)   SpO2 98%       Physical Exam Vitals and nursing note reviewed.  Constitutional:      General: She is not in acute distress.    Appearance: Normal appearance. She is not  ill-appearing or toxic-appearing.  HENT:     Head: Normocephalic and atraumatic.  Eyes:     General: No scleral icterus.       Right eye: No discharge.        Left eye:  No discharge.     Conjunctiva/sclera: Conjunctivae normal.  Cardiovascular:     Rate and Rhythm: Regular rhythm. Bradycardia present.  Pulmonary:     Effort: Pulmonary effort is normal. No respiratory distress.  Musculoskeletal:     Cervical back: Neck supple.     Right ankle: Swelling (mild swelling lateral ankle) present. Tenderness present over the lateral malleolus and ATF ligament. Normal range of motion. Normal pulse.     Comments: Pain is out of proportion to physical exam. With me barely touching the skin of the ankle, patient pulls away in pain.   Skin:    General: Skin is dry.  Neurological:     General: No focal deficit present.     Mental Status: She is alert. Mental status is at baseline.     Motor: No weakness.     Gait: Gait abnormal.  Psychiatric:        Mood and Affect: Mood normal.        Behavior: Behavior normal.      UC Treatments / Results  Labs (all labs ordered are listed, but only abnormal results are displayed) Labs Reviewed - No data to display  EKG   Radiology No results found.  Narrative & Impression  CLINICAL DATA:  Right ankle pain twisting injury   EXAM: RIGHT ANKLE - COMPLETE 3+ VIEW   COMPARISON:  Right foot x-ray 09/12/2021   FINDINGS: Corticated osseous density adjacent to the tip of the medial malleolus suggesting ossicle or remote injury. No definite acute fracture or malalignment. Ankle mortise is symmetric. Soft tissues are unremarkable   IMPRESSION: Findings suggest ossicle or remote injury at the medial malleolus. No definite acute osseous abnormality.     Electronically Signed   By: Jasmine Pang M.D.   On: 06/07/2023 15:23    Procedures Procedures (including critical care time)  Medications Ordered in UC Medications - No data to  display  Initial Impression / Assessment and Plan / UC Course  I have reviewed the triage vital signs and the nursing notes.  Pertinent labs & imaging results that were available during my care of the patient were reviewed by me and considered in my medical decision making (see chart for details).    53 year old female returns for 2 week history of right lateral ankle pain after inversion injury. Patient seen in this department 1 week ago. Diagnosed with ankle sprain and given Aircast. Has crutches. She says she has been performing physical therapy exercises at home and taking Motrin.  Has not received a call from [facility] where the referral was placed.  Xray results "Findings suggest ossicle or remote injury at the medial malleolus. No definite acute osseous abnormality."  On evaluation today, patient has mild swelling of the lateral ankle and tenderness over the lateral malleolus and ATFL.  Full range of motion.  Patient's pain does seem to be out of proportion to physical exam.  Patient pulls away in pain when I barely touched the skin of her ankle.  Information provided to patient to contact the [facility] in [place] where the referral was placed.  Advised if she cannot see them she should follow-up with [facility] and information was given for that.  Advised to continue with RICE guidelines.  Refilled ibuprofen.  Continue with the Aircast.  Work note was given for the next 3 days but discussed the importance of seeing orthopedics.  May need to MRI if she  truly is having as much pain as she is stating she is.   Final Clinical Impressions(s) / UC Diagnoses   Final diagnoses:  Acute right ankle pain  Sprain of right ankle, unspecified ligament, subsequent encounter     Discharge Instructions      Call the ortho office below. A referral was placed at your last visit. Where: [place] [facility] Address:  [address] [place] [address] Phone: [phone] Expires: 06/08/2024 (requested) Right ankle sprain. Significant pain  If you cannot get in at the office above, follow up with one of these office.   [facility] Address: [address] [place], [place] [address] Phone: [phone]  [facility] [address] [place], [place], [place] [address] Phone: [phone]  [facility] [address] [place], [place] [address] Phone: [phone]   SPRAIN: Stressed avoiding painful activities . Reviewed RICE guidelines. Use medications as directed, including NSAIDs. If no NSAIDs have been prescribed for you today, you may take Aleve or Motrin over the counter. May use Tylenol in between doses of NSAIDs.  I     ED Prescriptions     Medication Sig Dispense Auth. Provider   ibuprofen (ADVIL) 600 MG tablet Take 1 tablet (600 mg total) by mouth every 6 (six) hours as needed for moderate pain (pain score 4-6). 30 tablet Gareth Morgan      PDMP not reviewed this encounter.   Shirlee Latch, PA-C 06/16/23 1257

## 2023-06-20 ENCOUNTER — Ambulatory Visit: Admission: EM | Admit: 2023-06-20 | Discharge: 2023-06-20 | Disposition: A | Discharge status: Home / Self Care

## 2023-06-20 DIAGNOSIS — M25571 Pain in right ankle and joints of right foot: Secondary | ICD-10-CM | POA: Diagnosis not present

## 2023-06-20 NOTE — ED Provider Notes (Signed)
 [facility]    CSN: [id number] Arrival date & time: 06/20/23  1145      History   Chief Complaint Chief Complaint  Patient presents with   Ankle Pain    HPI Sherry Little is a 53 y.o. female who presents for a work note. States she could hardly walk yesterday since she stool up her whole shift yesterday and her ankle was very swollen at the end of the shift and her pain today feels worse. She has been wearing her ankle brace. Has ortho appointment in 3 days. She was not given any restrictions to return to work, so her boss expects her to do her regular job.     Past Medical History:  Diagnosis Date   Anxiety    Depression    Heart murmur    Hypertension    Migraine    VSD (ventricular septal defect and aortic arch hypoplasia     Patient Active Problem List   Diagnosis Date Noted   Portal hypertension (HCC)    Other diseases of stomach and duodenum    Secondary esophageal varices without bleeding (HCC)    Loss of weight    Iron deficiency anemia due to chronic blood loss    Gastritis without bleeding    Polyp of sigmoid colon    Abdominal pain, epigastric 03/17/2016   Symptomatic anemia 03/17/2016   Valvular heart disease 03/17/2016   Bradycardia 03/17/2016   Abdominal pain 03/17/2016    Past Surgical History:  Procedure Laterality Date   COLONOSCOPY WITH PROPOFOL N/A 03/19/2016   Procedure: COLONOSCOPY WITH PROPOFOL;  Surgeon: Midge Minium, MD;  Location: [facility];  Service: Endoscopy;  Laterality: N/A;   ESOPHAGOGASTRODUODENOSCOPY (EGD) WITH PROPOFOL N/A 03/19/2016   Procedure: ESOPHAGOGASTRODUODENOSCOPY (EGD) WITH PROPOFOL;  Surgeon: Midge Minium, MD;  Location: [facility];  Service: Endoscopy;  Laterality: N/A;   GANGLION CYST EXCISION  2008   left wrist   HAND SURGERY     cyst romved from left thumb   TUBAL LIGATION      OB History   No obstetric history on file.      Home Medications    Prior to Admission medications    Medication Sig Start Date End Date Taking? Authorizing Provider  albuterol (VENTOLIN HFA) 108 (90 Base) MCG/ACT inhaler Inhale 2 puffs into the lungs every 4 (four) hours as needed. 05/27/22  Yes Becky Augusta, NP  atorvastatin (LIPITOR) 20 MG tablet Take 20 mg by mouth daily.   Yes [provider]  gabapentin (NEURONTIN) 300 MG capsule Take 300 mg by mouth 3 (three) times daily.   Yes [provider]  hydrochlorothiazide (HYDRODIURIL) 25 MG tablet Take 1 tablet by mouth daily. 06/14/20  Yes [provider]  mirtazapine (REMERON) 45 MG tablet Take 1 tablet (45 mg total) by mouth once nightly at bedtime for mood and sleep. 07/10/21  Yes   OLANZapine (ZYPREXA) 2.5 MG tablet TAKE ONE TABLET (2.5MG  TOTAL) BY MOUTH ONCE DAILY AT BEDTIME. 03/08/21  Yes   albuterol (VENTOLIN HFA) 108 (90 Base) MCG/ACT inhaler Inhale 2 puffs into the lungs every 4 (four) hours as needed. 04/16/20 05/28/23  [provider]  desonide (DESOWEN) 0.05 % cream Apply to affected area 2 times daily 07/17/22 07/17/23  Georga Hacking, MD  EPINEPHrine 0.3 mg/0.3 mL IJ SOAJ injection Inject into the muscle. 08/23/22   [provider]  ibuprofen (ADVIL) 600 MG tablet Take 1 tablet (600 mg total) by mouth every 6 (  six) hours as needed for moderate pain (pain score 4-6). 06/16/23   Eusebio Friendly B, PA-C  ipratropium (ATROVENT) 0.06 % nasal spray Place 2 sprays into both nostrils 4 (four) times daily. 05/27/22   Becky Augusta, NP  mirtazapine (REMERON) 45 MG tablet TAKE ONE TABLET BY MOUTH ONCE DAILY AT BEDTIME FOR MOOD AND SLEEP. 03/08/21     nicotine (NICODERM CQ - DOSED IN MG/24 HOURS) 21 mg/24hr patch 21 mg daily. 01/21/23   [provider]  Spacer/Aero-Holding Chambers (AEROCHAMBER MV) inhaler Use as instructed 05/27/22   Becky Augusta, NP  citalopram (CELEXA) 10 MG tablet TAKE ONE TABLET BY MOUTH AT BEDTIME 12/08/19 01/29/21  Zachery Dauer, FNP  fluticasone (FLONASE) 50 MCG/ACT nasal spray Place  2 sprays into both nostrils daily. Patient not taking: No sig reported 12/28/15 07/20/20  Menshew, Charlesetta Ivory, PA-C    Family History Family History  Problem Relation Age of Onset   Lung cancer Mother    Other Father        homicide    Social History Social History   Tobacco Use   Smoking status: Every Day    Current packs/day: 0.75    Types: Cigarettes   Smokeless tobacco: Never  Vaping Use   Vaping status: Never Used  Substance Use Topics   Alcohol use: No   Drug use: Yes    Frequency: 2.0 times per week    Types: Marijuana     Allergies   Bee venom, Mushroom ext cmplx(shiitake-reishi-mait), Other, Mobic [meloxicam], and Tramadol   Review of Systems Review of Systems  As noted in HPI Physical Exam Triage Vital Signs ED Triage Vitals  Encounter Vitals Group     BP 06/20/23 1156 (!) 144/64     Systolic BP Percentile --      Diastolic BP Percentile --      Pulse Rate 06/20/23 1156 (!) 57     Resp 06/20/23 1156 16     Temp 06/20/23 1156 98.5 F (36.9 C)     Temp Source 06/20/23 1156 Oral     SpO2 06/20/23 1156 96 %     Weight --      Height --      Head Circumference --      Peak Flow --      Pain Score 06/20/23 1154 10     Pain Loc --      Pain Education --      Exclude from Growth Chart --    No data found.  Updated Vital Signs BP (!) 144/64 (BP Location: Left Arm)   Pulse (!) 57   Temp 98.5 F (36.9 C) (Oral)   Resp 16   LMP 06/30/2020 (Approximate)   SpO2 96%   Visual Acuity Right Eye Distance:   Left Eye Distance:   Bilateral Distance:    Right Eye Near:   Left Eye Near:    Bilateral Near:     Physical Exam Vitals and nursing note reviewed.  Constitutional:      General: She is not in acute distress.    Appearance: She is normal weight. She is not toxic-appearing.  Eyes:     General: No scleral icterus.    Conjunctiva/sclera: Conjunctivae normal.  Pulmonary:     Effort: Pulmonary effort is normal.  Musculoskeletal:      Cervical back: Neck supple.     Comments: R ANKLE- with minimal swelling on lateral area. Has tenderness on lateral and anterior ankle region. ROM  is limited due to pain. +2/4 pedal pulses.   Skin:    General: Skin is warm and dry.     Findings: No bruising, erythema or rash.  Neurological:     Mental Status: She is alert and oriented to person, place, and time.  Psychiatric:        Mood and Affect: Mood normal.        Behavior: Behavior normal.        Thought Content: Thought content normal.        Judgment: Judgment normal.      UC Treatments / Results  Labs (all labs ordered are listed, but only abnormal results are displayed) Labs Reviewed - No data to display  EKG   Radiology No results found.  Procedures Procedures (including critical care time)  Medications Ordered in UC Medications - No data to display  Initial Impression / Assessment and Plan / UC Course  I have reviewed the triage vital signs and the nursing notes.  R Ankle sprain  Note give, but I did not write restrictions since she will see ortho in 3 days and they can do that. She also does not work the next 2 days.  She is to continue wearing her splint and doing her ankle exercises.   Final Clinical Impressions(s) / UC Diagnoses   Final diagnoses:  Acute right ankle pain   Discharge Instructions   None    ED Prescriptions   None    PDMP not reviewed this encounter.   Garey Ham, PA-C 06/20/23 1213

## 2023-06-20 NOTE — ED Triage Notes (Signed)
 Patient states that she's still having ankle pain. Patient states that she went back to work last night and states that her ankle hurts more now. Patient states that she see's Ortho on Monday. Right ankle

## 2023-06-23 ENCOUNTER — Ambulatory Visit: Admitting: Podiatry

## 2023-07-07 ENCOUNTER — Ambulatory Visit (INDEPENDENT_AMBULATORY_CARE_PROVIDER_SITE_OTHER): Admitting: Podiatry

## 2023-07-07 DIAGNOSIS — Z91199 Patient's noncompliance with other medical treatment and regimen due to unspecified reason: Secondary | ICD-10-CM

## 2023-07-09 NOTE — Progress Notes (Signed)
 Patient was no-show for appointment today. Per policy will not be rescheduled

## 2023-10-08 ENCOUNTER — Ambulatory Visit: Admitting: Podiatry

## 2023-10-13 ENCOUNTER — Other Ambulatory Visit: Payer: Self-pay

## 2023-10-14 ENCOUNTER — Other Ambulatory Visit: Payer: Self-pay

## 2023-10-15 ENCOUNTER — Ambulatory Visit: Admitting: Podiatry

## 2023-10-16 ENCOUNTER — Other Ambulatory Visit: Payer: Self-pay

## 2023-11-10 ENCOUNTER — Other Ambulatory Visit: Payer: Self-pay

## 2023-11-11 ENCOUNTER — Other Ambulatory Visit: Payer: Self-pay

## 2023-11-12 ENCOUNTER — Other Ambulatory Visit: Payer: Self-pay

## 2023-11-13 ENCOUNTER — Other Ambulatory Visit: Payer: Self-pay
